# Patient Record
Sex: Male | Born: 1935 | Race: White | Hispanic: No | Smoking: Former smoker
Health system: Southern US, Community
[De-identification: ages and names within clinical notes are randomized; demographics above are authoritative.]

## PROBLEM LIST (undated history)

## (undated) DIAGNOSIS — C61 Malignant neoplasm of prostate: Secondary | ICD-10-CM

## (undated) DIAGNOSIS — I10 Essential (primary) hypertension: Secondary | ICD-10-CM

## (undated) DIAGNOSIS — Z87442 Personal history of urinary calculi: Secondary | ICD-10-CM

## (undated) DIAGNOSIS — E119 Type 2 diabetes mellitus without complications: Secondary | ICD-10-CM

## (undated) DIAGNOSIS — M8580 Other specified disorders of bone density and structure, unspecified site: Secondary | ICD-10-CM

## (undated) DIAGNOSIS — E7800 Pure hypercholesterolemia, unspecified: Secondary | ICD-10-CM

## (undated) DIAGNOSIS — N189 Chronic kidney disease, unspecified: Secondary | ICD-10-CM

## (undated) DIAGNOSIS — I499 Cardiac arrhythmia, unspecified: Secondary | ICD-10-CM

## (undated) DIAGNOSIS — M1990 Unspecified osteoarthritis, unspecified site: Secondary | ICD-10-CM

## (undated) DIAGNOSIS — I480 Paroxysmal atrial fibrillation: Secondary | ICD-10-CM

## (undated) DIAGNOSIS — K219 Gastro-esophageal reflux disease without esophagitis: Secondary | ICD-10-CM

## (undated) HISTORY — PX: LAPAROSCOPIC CHOLECYSTECTOMY: SUR755

## (undated) HISTORY — PX: TONSILLECTOMY: SUR1361

## (undated) HISTORY — PX: CYSTOSCOPY W/ STONE MANIPULATION: SHX1427

---

## 1999-05-05 ENCOUNTER — Ambulatory Visit (HOSPITAL_BASED_OUTPATIENT_CLINIC_OR_DEPARTMENT_OTHER): Admission: RE | Admit: 1999-05-05 | Discharge: 1999-05-05 | Payer: Self-pay | Admitting: Otolaryngology

## 1999-09-21 ENCOUNTER — Encounter: Payer: Self-pay | Admitting: Emergency Medicine

## 1999-09-21 ENCOUNTER — Inpatient Hospital Stay (HOSPITAL_COMMUNITY): Admission: EM | Admit: 1999-09-21 | Discharge: 1999-09-24 | Payer: Self-pay | Admitting: Emergency Medicine

## 2000-01-23 ENCOUNTER — Ambulatory Visit (HOSPITAL_BASED_OUTPATIENT_CLINIC_OR_DEPARTMENT_OTHER): Admission: RE | Admit: 2000-01-23 | Discharge: 2000-01-23 | Payer: Self-pay | Admitting: Urology

## 2002-04-02 ENCOUNTER — Ambulatory Visit (HOSPITAL_COMMUNITY): Admission: RE | Admit: 2002-04-02 | Discharge: 2002-04-02 | Payer: Self-pay | Admitting: Gastroenterology

## 2004-01-01 DIAGNOSIS — C61 Malignant neoplasm of prostate: Secondary | ICD-10-CM

## 2004-01-01 HISTORY — DX: Malignant neoplasm of prostate: C61

## 2004-04-10 ENCOUNTER — Ambulatory Visit: Admission: RE | Admit: 2004-04-10 | Discharge: 2004-06-05 | Payer: Self-pay | Admitting: Radiation Oncology

## 2004-04-30 HISTORY — PX: PROSTATECTOMY: SHX69

## 2004-05-08 ENCOUNTER — Encounter (INDEPENDENT_AMBULATORY_CARE_PROVIDER_SITE_OTHER): Payer: Self-pay | Admitting: *Deleted

## 2004-05-08 ENCOUNTER — Inpatient Hospital Stay (HOSPITAL_COMMUNITY): Admission: RE | Admit: 2004-05-08 | Discharge: 2004-05-12 | Payer: Self-pay | Admitting: Urology

## 2004-06-08 ENCOUNTER — Inpatient Hospital Stay (HOSPITAL_COMMUNITY): Admission: AD | Admit: 2004-06-08 | Discharge: 2004-06-13 | Payer: Self-pay | Admitting: Urology

## 2005-07-31 HISTORY — PX: URETHRAL STRICTURE DILATATION: SHX477

## 2005-08-14 ENCOUNTER — Encounter: Admission: RE | Admit: 2005-08-14 | Discharge: 2005-08-14 | Payer: Self-pay | Admitting: Urology

## 2005-08-17 ENCOUNTER — Ambulatory Visit (HOSPITAL_COMMUNITY): Admission: RE | Admit: 2005-08-17 | Discharge: 2005-08-17 | Payer: Self-pay | Admitting: Urology

## 2005-08-17 ENCOUNTER — Ambulatory Visit (HOSPITAL_BASED_OUTPATIENT_CLINIC_OR_DEPARTMENT_OTHER): Admission: RE | Admit: 2005-08-17 | Discharge: 2005-08-17 | Payer: Self-pay | Admitting: Urology

## 2007-12-10 ENCOUNTER — Encounter: Admission: RE | Admit: 2007-12-10 | Discharge: 2007-12-10 | Payer: Self-pay | Admitting: Urology

## 2007-12-11 ENCOUNTER — Ambulatory Visit (HOSPITAL_BASED_OUTPATIENT_CLINIC_OR_DEPARTMENT_OTHER): Admission: RE | Admit: 2007-12-11 | Discharge: 2007-12-11 | Payer: Self-pay | Admitting: Urology

## 2009-08-09 ENCOUNTER — Ambulatory Visit: Payer: Self-pay | Admitting: Surgery

## 2009-08-09 ENCOUNTER — Encounter (INDEPENDENT_AMBULATORY_CARE_PROVIDER_SITE_OTHER): Payer: Self-pay | Admitting: Orthopedic Surgery

## 2009-08-09 ENCOUNTER — Ambulatory Visit: Admission: RE | Admit: 2009-08-09 | Discharge: 2009-08-09 | Payer: Self-pay | Admitting: Orthopedic Surgery

## 2009-11-23 ENCOUNTER — Observation Stay (HOSPITAL_COMMUNITY): Admission: EM | Admit: 2009-11-23 | Discharge: 2009-11-24 | Payer: Self-pay | Admitting: Emergency Medicine

## 2010-01-28 ENCOUNTER — Encounter: Admission: RE | Admit: 2010-01-28 | Discharge: 2010-01-28 | Payer: Self-pay | Admitting: Orthopedic Surgery

## 2011-04-04 LAB — GLUCOSE, CAPILLARY
Glucose-Capillary: 102 mg/dL — ABNORMAL HIGH (ref 70–99)
Glucose-Capillary: 185 mg/dL — ABNORMAL HIGH (ref 70–99)

## 2011-04-04 LAB — CBC
HCT: 42.8 % (ref 39.0–52.0)
MCV: 88.7 fL (ref 78.0–100.0)
MCV: 89.3 fL (ref 78.0–100.0)
Platelets: 227 10*3/uL (ref 150–400)
RBC: 4.31 MIL/uL (ref 4.22–5.81)
RDW: 14.6 % (ref 11.5–15.5)

## 2011-04-04 LAB — DIFFERENTIAL
Basophils Relative: 0 % (ref 0–1)
Eosinophils Absolute: 0 10*3/uL (ref 0.0–0.7)
Lymphocytes Relative: 7 % — ABNORMAL LOW (ref 12–46)
Lymphs Abs: 0.8 10*3/uL (ref 0.7–4.0)
Monocytes Relative: 2 % — ABNORMAL LOW (ref 3–12)
Neutro Abs: 10.2 10*3/uL — ABNORMAL HIGH (ref 1.7–7.7)

## 2011-04-04 LAB — COMPREHENSIVE METABOLIC PANEL
ALT: 17 U/L (ref 0–53)
AST: 16 U/L (ref 0–37)
Alkaline Phosphatase: 60 U/L (ref 39–117)
Calcium: 8.7 mg/dL (ref 8.4–10.5)
Chloride: 102 mEq/L (ref 96–112)
Creatinine, Ser: 0.91 mg/dL (ref 0.4–1.5)
Potassium: 4 mEq/L (ref 3.5–5.1)
Sodium: 136 mEq/L (ref 135–145)
Total Bilirubin: 0.8 mg/dL (ref 0.3–1.2)

## 2011-04-04 LAB — POCT CARDIAC MARKERS
CKMB, poc: 1.1 ng/mL (ref 1.0–8.0)
Troponin i, poc: <0.05 ng/mL (ref 0.00–0.09)

## 2011-04-04 LAB — URINALYSIS, ROUTINE W REFLEX MICROSCOPIC
Leukocytes, UA: NEGATIVE
Protein, ur: NEGATIVE mg/dL
Specific Gravity, Urine: 1.023 (ref 1.005–1.030)
Urobilinogen, UA: 0.2 mg/dL (ref 0.0–1.0)

## 2011-04-04 LAB — URINE MICROSCOPIC-ADD ON

## 2011-05-15 NOTE — Op Note (Signed)
Carl Strong, Carl Strong              ACCOUNT NO.:  [id number]   MEDICAL RECORD NO.:  [id number]          PATIENT TYPE:  AMB   LOCATION:  [facility]                         FACILITY:  [facility]   PHYSICIAN:  Sigmund I. Patsi Sears, M.D.DATE OF BIRTH:  March 03, 1929   DATE OF PROCEDURE:  12/11/2007  DATE OF DISCHARGE:                               OPERATIVE REPORT   PREOPERATIVE DIAGNOSIS:  Bladder neck contracture and bladder stone.   POSTOPERATIVE DIAGNOSIS:  Bladder neck contracture and bladder stone.   PROCEDURES:  1. Pancystourethroscopy.  2. Dilation of bladder neck contractures/stricture.  3. Laser lithotripsy of bladder stone (4 cm).   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  He also had a bladder stone and presents today for  elective management.   PROCEDURE IN DETAIL:  The patient is brought to the operating room.  After the successful induction of LMA anesthetic, performing a  preoperative time out, and receiving preoperative antibiotics, he was  placed in the dorsal lithotomy position noting that all pressure points  were padded.  He was prepped and draped in the usual sterile fashion.  We used a 22 French sheath to navigate through the patient's urethra and  at the bladder neck, a bladder neck contracture was seen.  Of note, his  urethra otherwise appeared normal except for the absence of a prostate.  The bladder neck contracture was traversed with a glidewire under direct  vision.  We then used the balloon dilator over the glidewire to dilate  the stricture to approximately 14 to 16 atmospheres of pressure.  The  balloon was left in place for approximately one minute.  At this point,  the balloon was removed and the cystoscope was inserted back into the  urethra and now the bladder neck contracture had been dilated sufficient  to allow the passage of a 22 French cystoscope.   On entering the  patient's bladder, a large stone could be seen.  It was approximately 4  cm in diameter.  The rest of the bladder appeared normal, although the  exam was limited at this point due to the stone.   At this point, we elected to proceed with laser lithotripsy.  Using the  1 mm laser fiber on settings of 1 joule and 6 hertz, we broke up the  stone into small fragments.  As we proceeded at breaking up the stone  into smaller fragments, we were able to identify the nidus for the stone  formation which was a Hem-A-Lock clip that had somehow migrated and  eroded into the bladder.  All the stone fragments and the Hem-A-Lock  clip were removed in their entirety and sent for an evaluation.  At this  point, we completed the cystoscopy.  We identified both ureteral  orifices effluxing clear urine and the mucosa was identified and all  looked normal except for slightly friable from the stone manipulation.  The bladder was emptied, filled up again, and as there was some minor  mucosal bleeding, we did leave a catheter  in place which was a 20 [place]  catheter.  At this point, after seeing clear urine emanating from the  catheter and the balloon inflated, the procedure was ended.  Please  note, Dr. Patsi Sears was present throughout the entirety of the case.  Estimated blood loss minimal.  Urine output not recorded.  Drains 20  French Foley catheter.   SPECIMENS:  1. Hem-A-Lock clip.  2. Stone for stone analysis.     ______________________________  Dr. Erin Hearing I. Patsi Sears, M.D.  Electronically Signed    Hadley Pen  D:  12/11/2007  T:  12/11/2007  Job:  [id number]

## 2011-05-18 NOTE — H&P (Signed)
NAME:  Carl Strong, Carl Strong                        ACCOUNT NO.:  [id number]   MEDICAL RECORD NO.:  [id number]                   PATIENT TYPE:  INP   LOCATION:  0351                                 FACILITY:  [facility]   PHYSICIAN:  Lindaann Slough, M.D.               DATE OF BIRTH:  July 20, 1929   DATE OF ADMISSION:  06/08/2004  DATE OF DISCHARGE:                                HISTORY & PHYSICAL   HISTORY OF PRESENT ILLNESS:  Mr. Whitenack is a 77 year old single white male  (widowed) who is a prostate cancer patient of Dr. Imelda Pillow.  On May 08, 2004 the patient underwent radical retropubic prostatectomy with Dr.  Patsi Sears.  He had a relatively normal postoperative course with the  exception of some minor suprapubic swelling.  He also had some penile and  scrotal edema.  This resolved shortly after discharge and the patient had  done well ever since.  On Wednesday, June 07, 2004, the patient presented to  the office for a regular follow up.  He had some significant swelling in his  suprapubic area that he states started on Monday, June 05, 2004.  The patient  states that up until that time his scrotal and penile edema had gone down  and he was doing well.  When he presented to the office on Wednesday, June 07, 2004, he stated that he was not feeling well and had not eaten in a  couple of days.  He had significant on and off pain with this swelling.  He  states that he had not really been taking his temperature but he thought he  had been running a low-grade temp.  In the office it was 99.  He states that  he tried some ice on the area; however, this did not help much.  He took  some Tylenol and Advil for the pain but this also did not alleviate his  symptoms.  He is concerned because he feels like things are not getting much  better.   The patient was given 1 g of Rocephin in the office on Wednesday, June 07, 2004, and started on Cipro 500 mg p.o. b.i.d.  He did not want to be  admitted to  the hospital if at all possible, so Dr. Patsi Sears allowed him  to go home on Wednesday; however, he presents back to the office today on  June 08, 2004 with worsening swelling in the suprapubic area.  He was  evaluated by Dr. Brunilda Payor and myself and we have advised the patient that he  needs to be admitted to the hospital.  He will be admitted for abscess of  his suprapubic area around his RRP wound.  CT scan of the pelvis in the  office was obtained and radiologist has called Dr. Brunilda Payor to confirm that he  does have fluid in his pelvis consistent with an abscess.   PAST MEDICAL HISTORY:  1. Diabetes mellitus type 2--well controlled.  2. Hypertension.  3. History of BPH.  4. History of RRP on May 08, 2004.   SURGICAL HISTORY:  RRP on May 08, 2004.   SOCIAL HISTORY:  The patient is widowed and has one daughter who lives here  in town.  He lives alone.  He smoked 2 packs a day for 40 years but quit in  1985.  He denies any alcohol use.   FAMILY HISTORY:  The patient states details is unknown and it is  noncontributory.   MEDICATIONS:  1. Metformin 500 mg p.o. b.i.d.  2. Glipizide ER 10 mg p.o. daily.  3. Hydrochlorothiazide 25 mg p.o. daily.  4. Lisinopril 10 mg p.o. daily.  5. Cipro 500 mg p.o. b.i.d. (given to him by Dr. Patsi Sears yesterday).   ALLERGIES:  No known drug allergies.   PHYSICAL EXAMINATION:  GENERAL:  The patient is a 77 year old obese white  male who presents to the office today in mild distress.  HEENT:  Normocephalic, PERRLA, throat is clear.  NECK:  Supple with no lymphadenopathy noted.  CARDIAC:  Regular rate and rhythm.  No murmurs, rubs, or gallops are noted.  RESPIRATORY:  Lungs are clear to auscultation bilaterally.  ABDOMEN:  Rotund abdomen, some swelling at the bottom of his abdomen, but he  has positive bowel sounds.  GENITOURINARY:  He has significant swelling in his suprapubic area that is  tender to touch and warm as well as erythematous.  His RRP scar  is well  healed but also is red and tender to touch.  His penis has some mild to  moderate edema as well as his scrotum.  NEUROLOGICAL:  He is alert and oriented x3.  EXTREMITIES:  No edema is noted at this time on his lower extremities.   IMAGING:  A CT scan of the pelvis was obtained in our office today before  being admitted to the hospital and the radiologist has called Dr. Brunilda Payor to  inform him that his findings are significant with abscess in the suprapubic  area.   ASSESSMENT:  Abscess around radical retropubic prostatectomy surgical  incision and in the lower pelvic area.   PLAN:  Dr. Brunilda Payor and I have admitted this patient to [facility].  We  will start Rocephin 1 g IV q.12 h. as well as Cipro 500 mg p.o. b.i.d.  He  will also have medicines for pain control as well.  We will observe the  patient and possibly consider draining the area of abscess.  He will be  admitted for further evaluation.     Terri Piedra, N.P.                         Lindaann Slough, M.D.    HB/MEDQ  D:  06/08/2004  T:  06/08/2004  Job:  [id number]

## 2011-05-18 NOTE — Op Note (Signed)
NAME:  Carl Strong, Carl Strong                        ACCOUNT NO.:  [id number]   MEDICAL RECORD NO.:  [id number]                   PATIENT TYPE:  INP   LOCATION:  X008                                 FACILITY:  [facility]   PHYSICIAN:  Sigmund I. Patsi Sears, M.D.         DATE OF BIRTH:  Jan 29, 1929   DATE OF PROCEDURE:  05/08/2004  DATE OF DISCHARGE:                                 OPERATIVE REPORT   PREOPERATIVE DIAGNOSIS:  Prostate cancer (Gleason 6, 7; pre prostatic  specific antigen 4.81).   POSTOPERATIVE DIAGNOSIS:  Prostate cancer (Gleason 6, 7; pre prostatic  specific antigen 4.81).   OPERATION/PROCEDURE:  Radical retropubic prostatectomy with bilateral pelvic  lymph node dissection.   SURGEON:  Sigmund I. Patsi Sears, M.D.   ASSISTANT:  Rhae Lerner, M.D.   ANESTHESIA:  General endotracheal anesthesia.   COMPLICATIONS:  None.   INDICATIONS FOR PROCEDURE:  Carl Strong is a 77 year old man with a history  of severe lower urinary tract symptoms and bladder outlet obstruction  secondary to BPH who is status post TUNA in 2001 and has since been treated  on alpha blockers.  The patient recently underwent PSA screening and was  found to have a PSA of 4.81.  The patient subsequent underwent transrectal  ultrasound-guided biopsy of his prostate which demonstrated a 41 mL prostate  with four areas of cancer:  1) Gleason's 6 in right lateral lobe, 2)  Gleason's 6 in the left lateral lobe, 3) Gleason's 7 in the left medial  lobe, 4) Gleason's 7 in the apex.  Carl Strong subsequently underwent CT  scan of the abdomen and pelvis which was negative for metastatic disease and  a bone scan which was also negative for metastatic disease.  Carl Strong  presents for radical retropubic prostatectomy.   DESCRIPTION OF PROCEDURE:  The patient was brought to the operating room and  following induction of anesthesia, was placed in the supine position with a  slight flex in the table and  approximately 20 degrees of Trendelenburg and  then prepped and draped in the usual sterile fashion.   A low abdominal incision was subsequently performed from the umbilicus to  the pubic bone.  The dissection was carried down through the subcutaneous  tissues and through the anterior rectus fascia using Bovie cautery.  The  rectus bellies were subsequently divided and the transversalis fascia  identified and entered to reveal the space of Retzius.  The transversalis  fascia was subsequently divided all the way down to the pubic bone and all  the way up to the umbilicus being careful to avoid injury into the  peritoneal cavity.  The peritoneum was subsequently dissected off of the  iliac vessels bilaterally and Omni track apparatus placed.   Attention was first focused on the right pelvic sidewall and the retractors  set to perform a right pelvic lymph node dissection following placement of  the 20-French Foley catheter into the bladder.  The right pelvic lymph nodes  were subsequently removed and sent for pathology evaluation.  During the  pelvic lymph node dissection, hemostasis was maintained using multiple small  clips on numerous tiny vessels involved with the right lymph node packet.  Once the right pelvic lymphadenectomy had been performed, the retractor  blades were reset to reveal the left pelvis.  A left pelvic lymphadenectomy  was subsequently performed and the lymph nodes sent for pathology.  Again,  hemostasis was maintained using multiple hemoclips to prevent small vessel  bleeding.   Once the bilateral pelvic lymph node dissection was completed, all fatty  tissues overlying the prostate as well as the bladder neck were bluntly  dissected away using a Kittner as well as Yonker suction.  Once the  surrounding tissues had been dissected away from the anterior surface of the  prostate and the overlying endopelvic fascia, the endopelvic fascia was  entered bilaterally using  sharp dissection with a 15-blade.  Pelvic fascia  was entered laterally near the reflection of the endopelvic fascia on the  pelvic sidewall.  Blunt dissection with the index finger was subsequently  used on either side to dissect all tissues away from the lateral edges of  the prostate.  At this time, the bilateral puboprostatic ligaments were  identified and these were dissected safely away from the dorsal vein complex  and divided.  Attention was now focused on the dorsal vein complex.  Initially a McDougal clamp was passed between the dorsal vein complex and  the underlying urethra at the apex of the prostate.  A #1 Vicryl suture was  passed through this space by the McDougal clamp and the dorsal vein complex  ligated.  A second #1 Vicryl suture was placed just distal to the first,  again using a McDougal clamp and a Stamey retractor on the prostate.  A 0  Vicryl was finally used to suture ligate the dorsal vein complex distal to  the first two sutures and after tying this, the dorsal vein complex was  divided with a 15-blade.  Two to three small areas of bleeding were noted  after division of the dorsal vein complex and these were controlled using #1  Monocryl to perform through separate figure-of-eight ligations.  Once  hemostasis had been achieved, the anterior and lateral surfaces of the  urethra were divided using a 15-blade and after apprehending fully with a  Kocher clamp, catheter was cut at the urethral meatus and brought into the  wound to provide traction on the prostate.  At this point the remainder of  the urethra posterior to the Foley catheter was divided.  At this point two  to three more figure-of-eight sutures using 2-0 Monocryl were required to  achieve hemostasis.  Once this had been performed, the posterior surface of  the prostate was slowly peeled away from the anterior rectum by developing space between the posterior leaf of Denonvilliers fascia and anterior  rectal  fascia.  The prostate was dissected off of the rectum all the way to the  seminal vesicles and vas deferens.  At this point the prostate was replaced  in its natural position and attention focused on the anterior bladder neck.  The bladder neck was subsequently opened using a 15-blade and carefully  divided down to the Foley balloon using Metzenbaum scissors as well as blunt  dissection with a right-angle clamp.  Once this had been performed, the  balloon was deflated and removed from the bladder, and the  proximal end of  the Foley catheter to the distal end using a Kocher clamp in order to  provide traction on the prostate.  The posterior bladder neck was  subsequently divided and the seminal vesicles cut along with a few remaining  bands of tissue holding the prostate to the anterior surface of the rectum.  Specimen was subsequently delivered and sent to pathology.   A careful check was once again performed to assure hemostasis and then  attention was focused on the urethral stump.  Five full-thickness 2-0  Monocryl sutures were placed at the 2 o'clock, 4 o'clock, 8 o'clock, 10  o'clock, and 12 o'clock positions around the urethra.   Next, attention was focused on the bladder neck.  The bladder neck was  narrowed down to a size compatible with the urethral stump using a tennis  racquet procedure.  The bladder mucosa at the bladder neck was subsequently  everted using circumferential, interrupted 4-0 Vicryl sutures.  A 20-French  Foley catheter was subsequently passed through the urethra and used to  bridge the gap between the urethral stump and the bladder.  The balloon was  then inflated within the bladder.  The anastomosis was then completed by  placing each of the previously described sutures in the urethral stump  through the newly created bladder neck and the lumen the outside fascia, and  tying all of these sutures after removal of the retractor blades under  gentle  tension from the Foley catheter.  Once the anastomosis was completed,  approximately 150 mL of saline were instilled through the catheter to  confirm the integrity of the urethral anastomosis.  A JP drain was  subsequently placed in the left lower quadrant through a separate incision  and sutured into place using #1 nylon suture.  The wound was subsequently  closed using a running #1 PDS to close the rectus muscles inferiorly and the  posterior rectus fascia superiorly followed by a second running #1 PDS to  close the anterior rectus fascia.  The skin was subsequently closed using  skin staples.  The wound was subsequently cleaned and dressed and the  patient allowed to awaken.  The patient tolerated the procedure well.  There  were no complications.   Please note that Dr. Jethro Bolus was present for the entire case and  participated in all aspects of the procedure.    Rhae Lerner, M.D.               Sigmund I. Patsi Sears, M.D.    JP/MEDQ  D:  05/08/2004  T:  05/08/2004  Job:  [id number]

## 2011-05-18 NOTE — Op Note (Signed)
[place]. [facility]  Patient:    Carl Strong, Carl Strong                       MRN: [id number] Attending:  Vonzell Schlatter. Patsi Sears, M.D. CC:         Merlene Laughter, M.D., Medicine                           Operative Report  PREOPERATIVE DIAGNOSIS: Benign prostatic hypertrophy.  POSTOPERATIVE DIAGNOSIS:  Same.  OPERATION:  Targus microwave thermotherapy.  SURGEONS:  Tannenbaum  ANESTHESIA:  Local.  PROCEDURE:  With the patient in the supine position, the bladder was catheterized, drained of clear urine.  The Xylocaine jelly was then placed in the urethra, and Xylocaine liquid was placed in the bladder.  The microcatheter was placed and the rectal ultrasound was accomplished and showed the balloon to be in excellent position.  The transrectal temperature probe was placed, the patient then underwent targus microwave thermotherapy.  HISTORY:  This is a 77 year old diabetic, who has been followed since 1998 with  benign prostatic hypertrophy, with a symptom score sheet of 20/7, with nocturia  frequency.  His PSA was 1.5, and he was felt to have a prostate nodule at that time.  Currently, the patient has benign prostatic hypertrophy only, on Cardura 8 mg one h.s.   He did worse on Flomax and was restarted on his Cardura.  He is now here for a targus microwave thermotherapy.  The patient has a prostate volume of 46cc, with a prostatic length of 3cc.  He has a PSA of 2.7, the PSA ______ 30%.  PROCEDURE:  The patient tolerated the microwave procedure well.  He had a maximum therapy time of 28 minutes and 30 seconds, with maximum rectal temperature of 42C, and a maximum MDS of 37.6C, with a total energy of 52.3 kilojoule.  Foley catheter was placed.  The patient desires to return to the office for Foley catheter removal and he will return for voiding trial Monday. DD:  01/23/00 TD:  01/23/00 Job: [id number]

## 2011-05-18 NOTE — Procedures (Signed)
[place]. [facility]  Patient:    Carl Strong, Carl Strong Visit Number: [id number] MRN: [id number]          Service Type: END Location: ENDO Attending Physician:  Dennison Bulla Ii Dictated by:   Verlin Grills, M.D. Proc. Date: 04/02/02 Admit Date:  04/02/2002 Discharge Date: 04/02/2002   CC:         Hal T. Stoneking, M.D.   Procedure Report  DATE OF BIRTH:  November 26, 1929.  REFERRING PHYSICIAN:  PROCEDURE PERFORMED:  Colonoscopy.  ENDOSCOPIST:  Verlin Grills, M.D.  INDICATIONS FOR PROCEDURE:  The patient is a 77 year old male.  He is referred for his first screening colonoscopy with polypectomy to prevent colon cancer.  I discussed with the patient the complications associated with colonoscopy and polypectomy including a 15 per 1000 risk of bleeding and 4 per 1000 risk of colon perforation requiring surgical repair.  The patient has signed the operative permit.  PREMEDICATION:  Versed 5 mg, Demerol 50 mg.  ENDOSCOPE:  Olympus pediatric video colonoscope.  DESCRIPTION OF PROCEDURE:  After obtaining informed consent, the patient was placed in the left lateral decubitus position.  I administered intravenous Demerol and intravenous Versed to achieve conscious sedation for the procedure.  The patients blood pressure, oxygen saturation and cardiac rhythm were monitored throughout the procedure and documented in the medical record.  Anal inspection was normal.  Digital rectal exam was normal.  The Olympus pediatric video colonoscope was then introduced into the rectum and under direct vision, advanced to the cecum.  Colonic preparation for the exam today was satisfactory.  Rectum:  Normal.  Sigmoid colon and descending colon:  Normal.  Splenic flexure:  Normal.  Transverse colon:  Normal.  Hepatic flexure:  Normal.  Ascending colon:  Normal.  Cecum and ileocecal valve:  Normal.  ASSESSMENT:  Normal proctocolonoscopy  to the cecum.  No endoscopic for the presence of colorectal neoplasia. Dictated by:   Verlin Grills, M.D. Attending Physician:  Dennison Bulla Ii DD:  04/02/02 TD:  04/02/02 Job: [id number]

## 2011-05-18 NOTE — Op Note (Signed)
[name], Carl Strong              ACCOUNT NO.:  [id number]   MEDICAL RECORD NO.:  [id number]          PATIENT TYPE:  AMB   LOCATION:  NESC                         [facility]ACILITY[facility]  [facility]   PHYSICIAN:  Sigmund I. Patsi Sears, M.D.DATE OF BIRTH:  [birth year]   DATE OF PROCEDURE:  08/17/2005  DATE OF DISCHARGE:                                 OPERATIVE REPORT   PREOPERATIVE DIAGNOSES:  Urethral stricture disease.   POSTOPERATIVE DIAGNOSES:  Urethral stricture disease.   OPERATIONS:  Cystourethroscopy, Heyman urethral dilation, balloon dilation  of urethral stricture, Foley catheter insertion (18-French).   SURGEON:  Sigmund I. Patsi Sears, M.D.   ANESTHESIA:  General LMA.   PREPARATION:  After appropriate preanesthesia, the patient was brought to  the operating room, placed on the operating table in dorsal supine position  where general LMA anesthesia was introduced. He was then replaced in dorsal  lithotomy position with the pubis was prepped with Betadine solution and  draped in the usual fashion.   REVIEW OF HISTORY:  This 77 year old male, has a history of radical  retropubic prostatectomy 1-1/2 years ago, with postop bladder neck  contracture. He is also a diabetic. The patient has required urethral  stricture dilation in the past, and is currently for dilation under  anesthesia.   DESCRIPTION OF PROCEDURE:  Cystourethroscopy was accomplished and dense  proximal urethral stricture is identified. The guidewire was passed across  the stricture into the bladder, and systematic Heyman dilation is  accomplished to a size 22-French. Balloon dilation with the 10 cm balloon  dilator was then accomplished for 3 minutes and following this, cystoscopy  was accomplished into the bladder. The bladder appeared to have some clots  within it, these were irrigated free from the bladder. Because of the dense  urethral stricture dilation, it was elected to leave a Foley catheter, an 38-  [place] Foley  catheter was placed with 10 mL in the balloon. The patient had  B and O suppository. He was given IV Toradol, awakened and taken to recovery  room in good condition.      Sigmund I. Patsi Sears, M.D.  Electronically Signed     SIT/MEDQ  D:  08/17/2005  T:  08/17/2005  Job:  [id number]

## 2011-05-18 NOTE — H&P (Signed)
NAME:  NAMON, VILLARIN                        ACCOUNT NO.:  [id number]   MEDICAL RECORD NO.:  [id number]                   PATIENT TYPE:  INP   LOCATION:  0359                                 FACILITY:  [facility]   PHYSICIAN:  Sigmund I. Patsi Sears, M.D.         DATE OF BIRTH:  26-Feb-1929   DATE OF ADMISSION:  05/08/2004  DATE OF DISCHARGE:  05/12/2004                                HISTORY & PHYSICAL   HISTORY OF PRESENT ILLNESS:  Mr. Gust is a 77 year old single white male  (widowed) who was seen in our office for consultation for prostate cancer.  The patient had TUNA therapy in 2001 for a symptom score sheet of 25/7, but  not improve very much until placed on doxazosin postop with a subsequent  decrease in symptom score sheet to 10/7.  Following a kidney infection  recently, the patient was reevaluated and was found to have an International  Prostate Score Sheet of 9/7 while on doxazosin.  He failed a trial of  UroXatral and returned to doxazosin.  He was found to have a PSA of 4.81  with a PSA II of 13%.   Prostate biopsies were then accomplished and showed that the patient had 4  separate areas of cancer.  The patient had a Gleason 6 prostate cancer in  less than 5% of the biopsies from the right lateral lobe, a 20% Gleason 6 in  the left lateral lobe, 30% Gleason 7 adenocarcinoma in the left median lobe,  and 30% Gleason 7 adenocarcinoma in the prostate in the apex biopsies.   Cancer staging workup showed that the patient had a CT of the abdomen which  was negative, CT of the pelvis showing a moderately enlarged prostate but no  pelvic lymphadenectomy.  Ureters were normal.  DEXA scan shows a normal AP  spine, a normal femoral neck, a normal hip.  Several therapies were  discussed with the patient regarding his prostate cancer.  He had a  consultation with Dr. Wynn Banker in regards to radiation therapy.  Dr. Dan Humphreys had discussed this with the patient and had agreed  that the  patient should probably seek radical prostatectomy.  The patient returned to  the office and re-discussed his options with Dr. Lynelle Smoke I. Tannenbaum and  they both decided that radical retropubic prostatectomy was the best  treatment plan for him.   PAST MEDICAL HISTORY:  1. Diabetes mellitus, type 2 -- well-controlled.  2. Hypertension.  3. History of BPH.   SURGICAL HISTORY:  None.   SOCIAL HISTORY:  The patient is widowed.  He has 1 daughter who lives here  in town.  He smoked 2 packs a day for 40 years but quit in 1985.  He denies  any alcohol use.   MEDICATIONS:  1. Doxazosin 8 mg 1 p.o. nightly.  2. Metformin 500 mg p.o. daily.  3. Glipizide ER 10 mg p.o. daily.  4. Hydrochlorothiazide 25 mg  p.o. daily.  5. Lisinopril 10 mg p.o. daily.   ALLERGIES:  No known drug allergies.   PHYSICAL EXAM:  GENERAL:  The patient is a 77 year old obese white male in  no acute distress.  HEENT/NECK:  Normocephalic; neck supple; PERRLA; no lymphadenopathy noted.  CARDIAC:  Regular rate and rhythm, no murmurs, rubs, or gallops noted.  RESPIRATORY:  Respiratory clear to auscultation bilaterally.  GI:  Rotund abdomen, soft, nontender, positive bowel sounds.  GU:  Prostate size is 41.3 mL by volume.  NEUROLOGIC:  Neuro is grossly intact.  EXTREMITIES:  No edema is noted.   LABORATORIES, X-RAYS AND IMAGING:  CT scan negative for lymphadenopathy.   Prostate biopsies show 4 separate areas of cancer (see breakdown in HPI).   ASSESSMENT:  Prostate cancer.   PLAN:  Dr. Patsi Sears has reviewed treatment options with the patient and  radical retropubic prostatectomy was decided on and risks and benefits of  procedure explained to the patient.  Questions answered and the patient will  be admitted to [facility] for surgery.     Terri Piedra, N.P.                         Sigmund I. Patsi Sears, M.D.    HB/MEDQ  D:  05/15/2004  T:  05/15/2004  Job:  [id number]

## 2011-05-18 NOTE — Discharge Summary (Signed)
Carl Strong, Carl Strong                        ACCOUNT NO.:  [id number]   MEDICAL RECORD NO.:  [id number]                   PATIENT TYPE:  INP   LOCATION:  0351                                 FACILITY:  [facility]   PHYSICIAN:  Lindaann Slough, M.D.               DATE OF BIRTH:  11-30-1929   DATE OF ADMISSION:  06/08/2004  DATE OF DISCHARGE:  06/13/2004                                 DISCHARGE SUMMARY   DISCHARGE DIAGNOSES:  Pelvic abscess status post radical prostatectomy for  carcinoma of the prostate.   PROCEDURE:  Incision and drainage of pelvic abscess on June 09, 2004.   HISTORY:  The patient is a 77 year old male who had a radical prostatectomy  on May 08, 2004.  He had a normal postoperative course, with the exception of  some minor suprapubic swelling.  He was seen in the office on June 07, 2004,  and was found to have more swelling in the suprapubic area, and he states  that that started 2 days prior to his visit on June 07, 2004.  On June 08, 2004, he returned to the office, and a CT scan of the pelvis showed a pelvic  abscess that was outside of the peritoneal cavity.  The patient was then  admitted for further treatment.   PHYSICAL EXAMINATION:  ABDOMEN:  Soft, tender in the suprapubic area where  there was some swelling.  Bowel sounds were normal.  His bladder is not  distended.   LABORATORY DATA:  His hemoglobin on admission was 11.4, with hematocrit of  33.6, WBC 10.5.  BUN 18, creatinine 1.0, sodium 130, potassium 2.9.   The lower end of the incision was opened under local anesthesia at the  patient's bedside, but no drainage was obtained through that incision.  The  patient was then kept NPO, and the plan was to take him to the OR on June 09, 2004 for drainage under anesthesia.  However, on the morning of 06/09/04,  it started draining from fluid through the incision, and a Penrose drain was  placed through the wound, and a large amount of fluid was drained out of  the  incision.  Therefore, he was not taken back to the OR.  Culture of the wound  was obtained, and the culture was positive for Staphylococcus aureus that  was sensitive to cefazolin.  The patient was kept on IV antibiotics, and  also oral Cipro, and the swelling of the lower abdomen had markedly  decreased, and the drainage had progressively decreased, and on June 13, 2004 he was afebrile, he was eating well, he was feeling well, he did not  have any pain, he was voiding well, except for some mild stress  incontinence.  He was then discharged home.   DISCHARGE MEDICATIONS:  1. Keflex 500 mg q.6h.  2. Metformin 500 mg twice a day.  3. Glucotrol 10 mg daily.  4. HCTZ 25 mg daily.  5. Lisinopril 10 mg daily.  6. Norvasc 5 mg daily.  7. Multivitamins one tablet daily.   FOLLOW UP:  He will be followed by Dr. Patsi Sears.   CONDITION ON DISCHARGE:  Improved.   DISCHARGE DIET:  Regular.   DISCHARGE INSTRUCTIONS:  1. The patient is instructed not to do any lifting, straining, but he may     drive.  2. He needs to change his dressing p.r.n.                                               Lindaann Slough, M.D.    MN/MEDQ  D:  06/13/2004  T:  06/13/2004  Job:  [id number]

## 2011-05-18 NOTE — Discharge Summary (Signed)
NAME:  Carl Strong, Carl Strong                        ACCOUNT NO.:  [id number]   MEDICAL RECORD NO.:  [id number]                   PATIENT TYPE:  INP   LOCATION:  0359                                 FACILITY:  [facility]   PHYSICIAN:  Sigmund I. Patsi Sears, M.D.         DATE OF BIRTH:  1929/05/15   DATE OF ADMISSION:  05/08/2004  DATE OF DISCHARGE:  05/12/2004                                 DISCHARGE SUMMARY   DISCHARGE DIAGNOSIS:  Prostate cancer.   HISTORY OF PRESENT ILLNESS:  Carl Strong is a 77 year old single white male  (widowed) who was seen in our office for consultation for prostate cancer.  The patient was found to have a PSA of 4.81 with a PSA2 of 13%.  Prostate  biopsies were accomplished and showed that the patient had 4 separate areas  of cancer.  The patient had a Gleason 6 prostate cancer in less than 5% of  the biopsies in the right lateral lobe, a 20% Gleason 6 in the left lateral  lobe, a 30% Gleason 7 adenocarcinoma in the left median lobe, and 30%  Gleason 7 adenocarcinoma in the prostate in the apex biopsies.  Dr.  Patsi Sears discussed options with the patient for treatment.  The patient  was consulted by Dr. Kathrin Greathouse for radiation therapy and after that  consultation it was decided that the patient would have radical retropubic  prostatectomy as the best treatment plan for him.   PAST MEDICAL HISTORY:  1. Diabetes mellitus type 2, well controlled.  2. Hypertension.  3. History of BPH.   HOSPITAL COURSE:  The patient underwent radical retropubic prostatectomy  with Dr. Patsi Sears.  His postoperative course was essentially uneventful.  The patient did have a significant drop from 10.4 to 8.5 in his hemoglobin.  At that time, he was given 2 units of packed red blood cells and the  patient's hemoglobin stabilized again.  The patient's only other complaint  was that his scrotum and penis began to swell.  This was lymph fluid due  from the surgery.  JP drain was  discontinued on the day before discharge and  the patient was ambulating well, having good bowel movements and able to  urinate without any problems.  On postop day #4, the patient was discharged  to home with instructions on how to alleviate his scrotal swelling.  He will  return to the office to see [facility] to remove his staples.   CONDITION ON DISCHARGE:  Stable.   DISCHARGE MEDICATIONS:  1. Vicodin p.r.n. for pain.  2. Cipro to take until his catheter is removed.   PLAN:  The patient will follow up in the office with [facility] to  have his staples removed at postop day 7 to 8, and then after having two  weeks of having his Foley catheter in he will also return to the office to  have a voiding trial and have it removed with  further follow up by Dr.  Patsi Sears.     Terri Piedra, N.P.                         Sigmund I. Patsi Sears, M.D.   HB/MEDQ  D:  05/25/2004  T:  05/25/2004  Job:  [id number]

## 2011-10-08 LAB — BASIC METABOLIC PANEL
Calcium: 9.4
Creatinine, Ser: 0.87
GFR calc Af Amer: >60
Sodium: 143

## 2011-10-08 LAB — POCT HEMOGLOBIN-HEMACUE: Hemoglobin: 14.1

## 2014-01-25 ENCOUNTER — Ambulatory Visit: Payer: Medicare Other | Attending: Geriatric Medicine | Admitting: Rehabilitative and Restorative Service Providers"

## 2014-01-25 DIAGNOSIS — R42 Dizziness and giddiness: Secondary | ICD-10-CM | POA: Insufficient documentation

## 2014-01-25 DIAGNOSIS — IMO0001 Reserved for inherently not codable concepts without codable children: Secondary | ICD-10-CM | POA: Insufficient documentation

## 2014-01-27 ENCOUNTER — Encounter: Payer: Medicare Other | Admitting: Rehabilitative and Restorative Service Providers"

## 2014-02-03 ENCOUNTER — Ambulatory Visit: Payer: Medicare Other | Admitting: Rehabilitative and Restorative Service Providers"

## 2014-10-04 ENCOUNTER — Ambulatory Visit: Payer: Medicare Other | Attending: Geriatric Medicine | Admitting: Rehabilitative and Restorative Service Providers"

## 2014-10-04 DIAGNOSIS — E119 Type 2 diabetes mellitus without complications: Secondary | ICD-10-CM | POA: Diagnosis not present

## 2014-10-04 DIAGNOSIS — Z5189 Encounter for other specified aftercare: Secondary | ICD-10-CM | POA: Insufficient documentation

## 2014-10-04 DIAGNOSIS — C61 Malignant neoplasm of prostate: Secondary | ICD-10-CM | POA: Insufficient documentation

## 2014-10-04 DIAGNOSIS — I10 Essential (primary) hypertension: Secondary | ICD-10-CM | POA: Insufficient documentation

## 2014-10-04 DIAGNOSIS — R269 Unspecified abnormalities of gait and mobility: Secondary | ICD-10-CM | POA: Diagnosis not present

## 2014-10-04 DIAGNOSIS — H8110 Benign paroxysmal vertigo, unspecified ear: Secondary | ICD-10-CM | POA: Insufficient documentation

## 2014-10-11 ENCOUNTER — Ambulatory Visit: Payer: Medicare Other | Admitting: Rehabilitative and Restorative Service Providers"

## 2014-10-11 DIAGNOSIS — Z5189 Encounter for other specified aftercare: Secondary | ICD-10-CM | POA: Diagnosis not present

## 2014-10-22 ENCOUNTER — Ambulatory Visit: Payer: Medicare Other | Admitting: Rehabilitative and Restorative Service Providers"

## 2014-10-22 DIAGNOSIS — Z5189 Encounter for other specified aftercare: Secondary | ICD-10-CM | POA: Diagnosis not present

## 2016-01-05 ENCOUNTER — Ambulatory Visit
Admission: RE | Admit: 2016-01-05 | Discharge: 2016-01-05 | Disposition: A | Discharge status: Home / Self Care | Payer: Medicare Other | Source: Ambulatory Visit | Attending: Geriatric Medicine | Admitting: Geriatric Medicine

## 2016-01-05 ENCOUNTER — Other Ambulatory Visit: Payer: Self-pay | Admitting: Geriatric Medicine

## 2016-01-05 DIAGNOSIS — R059 Cough, unspecified: Secondary | ICD-10-CM

## 2016-01-05 DIAGNOSIS — R05 Cough: Secondary | ICD-10-CM

## 2016-02-27 ENCOUNTER — Ambulatory Visit: Payer: Medicare Other | Attending: Geriatric Medicine | Admitting: Rehabilitative and Restorative Service Providers"

## 2016-02-27 DIAGNOSIS — R42 Dizziness and giddiness: Secondary | ICD-10-CM | POA: Diagnosis not present

## 2016-02-27 DIAGNOSIS — R269 Unspecified abnormalities of gait and mobility: Secondary | ICD-10-CM | POA: Insufficient documentation

## 2016-02-27 DIAGNOSIS — R2681 Unsteadiness on feet: Secondary | ICD-10-CM | POA: Diagnosis not present

## 2016-02-27 NOTE — Therapy (Signed)
[facility] [address] [place], [place], [address] Phone: [phone]   Fax:  [phone]  Physical Therapy Evaluation  Patient Details  Name: Carl Strong MRN: [id number] Date of Birth: January 20, 1929 Referring Provider: Lajean Manes  Encounter Date: 02/27/2016      PT End of Session - 02/27/16 0907    Visit Number 1   Number of Visits 5   Date for PT Re-Evaluation 03/28/16   Authorization Type [employer] Medicare   Authorization Time Period G codes every 10th visit   PT Start Time 0804   PT Stop Time 0846   PT Time Calculation (min) 42 min   Activity Tolerance Patient tolerated treatment well   Behavior During Therapy [facility] for tasks assessed/performed      No past medical history on file.  No past surgical history on file.  There were no vitals filed for this visit.  Visit Diagnosis:  Dizziness and giddiness  Unsteadiness  Abnormality of gait      Subjective Assessment - 02/27/16 0808    Subjective The pt is an 82 y.o. M with PMH of prostate cancer, HTN, DM II, with complaints of dizziness. Says he woke up Friday morning and couldn't get "from the bed to the living room without holding on." Says the whole room was spinning.  Pt says it has gotten better since last Friday but still feels "a bit woozy."   Pertinent History Prostate CA, HTN, DM II   Patient Stated Goals "I'd like to get rid of this dizziness and get back to feeling comfortable going out."   Currently in Pain? No/denies           [facility] PT Assessment - 02/27/16 [id number]    Assessment   Medical Diagnosis Vertigo/dizziness   Referring Provider Stoneking, Hal   Onset Date/Surgical Date 02/24/16   Prior Therapy Has been seen for vertigo in the past   Precautions   Precautions None   Balance Screen   Has the patient fallen in the past 6 months No   Has the patient had a decrease in activity level because of a fear of falling?  Yes   Is the  patient reluctant to leave their home because of a fear of falling?  Yes   [place] residence   Living Arrangements Alone   Type of [place] to enter   Entrance Stairs-Number of Steps 6   Entrance Stairs-Rails Can reach both;Left;Right   Home Layout One level   [place] None   Prior Function   Level of Independence Independent   Vocation Retired   ROM / Strength   AROM / PROM / Strength Strength   Strength   Overall Strength Within functional limits for tasks performed   Ambulation/Gait   Ambulation/Gait Yes   Ambulation/Gait Assistance 6: Modified independent (Device/Increase time)   Ambulation Distance (Feet) 50 Feet   Assistive device None   Gait Pattern Step-through pattern;Decreased stride length;Decreased step length - left;Decreased step length - right   Ambulation Surface Level;Indoor           Vestibular Assessment - 02/27/16 0850    Symptom Behavior   Type of Dizziness Spinning   Duration of Dizziness Short   Aggravating Factors Supine to sit;Turning head quickly   Relieving Factors Closing eyes   Occulomotor Exam   Occulomotor Alignment Normal   Spontaneous Absent   Gaze-induced Absent   Smooth  Pursuits Intact   Saccades Intact  2 beats vertical, WNL   Vestibulo-Occular Reflex   VOR to Slow Head Movement Normal   VOR Cancellation Normal   Comment Head Impulse test positive for corrective saccade bilaterally   Positional Testing   Dix-Hallpike Dix-Hallpike Right;Dix-Hallpike Left   Sidelying Test --   Horizontal Canal Testing Horizontal Canal Right;Horizontal Canal Left   Dix-Hallpike Right   Dix-Hallpike Right Duration <10 seconds   Dix-Hallpike Right Symptoms Upbeat, right rotatory nystagmus  large amplitude   Dix-Hallpike Left   Dix-Hallpike Left Symptoms No nystagmus   Horizontal Canal Right   Horizontal Canal Right Symptoms Normal   Horizontal Canal Left   Horizontal Canal Left  Symptoms Normal   Cognition   Cognition Orientation Level Oriented x 4           Vestibular Treatment/Exercise - 02/27/16 0001    Vestibular Treatment/Exercise   Vestibular Treatment Provided Canalith Repositioning   Canalith Repositioning Epley Manuever Right    EPLEY MANUEVER RIGHT   Number of Reps  3   Overall Response Improved Symptoms   Response Details  Performed Epley maneuver R x3 reps. R upbeating torsional nystagmus with supine head turn R [employer] starting position). Pt with reports of dizziness/nausea upon moving L sidelying to sitting, duration <15 seconds. During second trial, pt attempted to skip L sidelying position but able to get into position with verbal and tactile prompting from PT. Pt reportes improved symptoms - decreased dizziness/nausea - after third trial of Epley maneuver.           PT Education - 02/27/16 0858    Education provided Yes   Education Details Pt educated on BPPV and how this may cause vertigo. Described pathomechanics and anatomy of BPPV, Epley maneuver, and reasoning behind Epley maneuver.   Person(s) Educated Patient   Methods Explanation;Verbal cues;Tactile cues   Comprehension Verbalized understanding          PT Short Term Goals - 02/27/16 0908    PT SHORT TERM GOAL #1   Title STGs=LTGs           PT Long Term Goals - 02/27/16 0908    PT LONG TERM GOAL #1   Title The pt will demonstrate understanding of HEP to maximize functional gains made in PT.   Baseline TARGET DATE = 03/28/16   Time 4   Period Weeks   Status New   PT LONG TERM GOAL #2   Title The pt will demonstrate subjective improvement in dizziness/nausea and no nystagmus will be observed with positional testing to demonstrate resolution of BPPV to improve dizziness/vertigo.   Time 4   Period Weeks   Status New   PT LONG TERM GOAL #3   Title The pt will verbalize understanding of BPPV and the role it plays in vertigo/dizziness.   Time 4   Period Weeks    Status New           Plan - 02/27/16 0900    Clinical Impression Statement The pt is a 82 y.o. M with PMH of prostate CA, HTN, and DM II evaluated today for complaints of acute dizziness/vertigo. 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 The pt was treated with R Epley maneuver x3 and reported subjective improvement in symptoms and no nystagmus upon the third maneuver. The pt would benefit from skilled PT in order  to address dizziness, gait, and balance in order to return the pt to his prior level of independence and activity.   Pt will benefit from skilled therapeutic intervention in order to improve on the following deficits Abnormal gait;Decreased balance;Dizziness   Rehab Potential Good   PT Frequency 1x / week   PT Duration 4 weeks  Will likely D/C before 4 weeks due to in-session improvement in symptoms    PT Treatment/Interventions ADLs/Self Care Home Management;Canalith Repostioning;Gait training;Stair training;Functional mobility training;Therapeutic activities;Therapeutic exercise;Balance training;Neuromuscular re-education;Patient/family education;Manual techniques;Vestibular   PT Next Visit Plan Re-assess positional testing, HEP for [facility] Daroff/habituation/gaze, handout for BPPV, D/C if symptoms resolved?   Consulted and Agree with Plan of Care Patient       Problem List There are no active problems to display for this patient.   De Nurse, SPT 02/27/2016, 9:12 AM  [facility] [address] [place], [place], [address] Phone: [phone]   Fax:  [phone]  Name: RIGDON RECCHIA MRN: [id number] Date of Birth: 1929/07/06

## 2016-03-02 ENCOUNTER — Ambulatory Visit: Payer: Medicare Other | Attending: Geriatric Medicine | Admitting: Rehabilitative and Restorative Service Providers"

## 2016-03-02 DIAGNOSIS — R42 Dizziness and giddiness: Secondary | ICD-10-CM | POA: Insufficient documentation

## 2016-03-02 DIAGNOSIS — R269 Unspecified abnormalities of gait and mobility: Secondary | ICD-10-CM

## 2016-03-02 DIAGNOSIS — R2681 Unsteadiness on feet: Secondary | ICD-10-CM | POA: Diagnosis not present

## 2016-03-02 NOTE — Patient Instructions (Signed)
Sit to Side-Lying   Sit on edge of bed. Lie down onto the right side and hold until dizziness stops, plus 20 seconds.  Return to sitting and wait until dizziness stops, plus 20 seconds.  Repeat to the left side. Repeat sequence 5 times per session. Do 2 sessions per day.  Copyright  VHI. All rights reserved.  Gaze Stabilization: Tip Card 1.Target must remain in focus, not blurry, and appear stationary while head is in motion. 2.Perform exercises with small head movements (45 to either side of midline). 3.Increase speed of head motion so long as target is in focus. 4.If you wear eyeglasses, be sure you can see target through lens (therapist will give specific instructions for bifocal / progressive lenses). 5.These exercises may provoke dizziness or nausea. Work through these symptoms. If too dizzy, slow head movement slightly. Rest between each exercise. 6.Exercises demand concentration; avoid distractions. 7.For safety, perform standing exercises close to a counter, wall, corner, or next to someone.  Copyright  VHI. All rights reserved.  Gaze Stabilization: Standing Feet Apart   Feet shoulder width apart, keeping eyes on target on wall 3 feet away, tilt head down slightly and move head side to side for 30 seconds. Repeat while moving head up and down for 30 seconds. Do 2 sessions per day.   Copyright  VHI. All rights reserved.

## 2016-03-02 NOTE — Therapy (Signed)
[facility] [address] [place], [place], [address] Phone: [phone]   Fax:  [phone]  Physical Therapy Treatment  Patient Details  Name: Carl Strong MRN: [id number] Date of Birth: December 23, 1929 Referring Provider: Lajean Manes  Encounter Date: 03/02/2016      PT End of Session - 03/02/16 1500    Visit Number 2   Number of Visits 5   Date for PT Re-Evaluation 03/28/16   Authorization Type BCBS Medicare   Authorization Time Period G codes every 10th visit   PT Start Time 1403   PT Stop Time 1426   PT Time Calculation (min) 23 min   Activity Tolerance Patient tolerated treatment well   Behavior During Therapy [facility] for tasks assessed/performed      No past medical history on file.  No past surgical history on file.  There were no vitals filed for this visit.  Visit Diagnosis:  Dizziness and giddiness  Unsteadiness  Abnormality of gait      Subjective Assessment - 03/02/16 1405    Subjective Pt arrives today stating that he is "feeling good." Says he had feelings of off-balance on Wednesday. He performed Brandt-Daroff maneuver on his own which helped symptoms resolve   Pertinent History Prostate CA, HTN, DM II   Patient Stated Goals "I'd like to get rid of this dizziness and get back to feeling comfortable going out."           Vestibular Assessment - 03/02/16 1508    Dix-Hallpike Right   Dix-Hallpike Right Duration <4 beats nystagmus   Dix-Hallpike Right Symptoms Upbeat, right rotatory nystagmus   Dix-Hallpike Left   Dix-Hallpike Left Symptoms No nystagmus   Sidelying Right   Sidelying Right Symptoms No nystagmus;Other (comment)  Used for f/u post Epley maneuver           Vestibular Treatment/Exercise - 03/02/16 1457    Vestibular Treatment/Exercise   Vestibular Treatment Provided Canalith Repositioning;Gaze   Canalith Repositioning Epley Manuever Right   Gaze Exercises X1 Viewing  Horizontal;X1 Viewing Vertical    EPLEY MANUEVER RIGHT   Number of Reps  1   Overall Response Improved Symptoms   Response Details  Pt with improvements in dizziness and symptoms following performance of R Epley maneuver. No nystagmus noted upon R Dix Hallpike maneuver, R sidelying maneuver with 2 pillows to facilitate inc cervical extension following 1 Epley maneuver.   X1 Viewing Horizontal   Foot Position Normal stance in standing   Reps 1   Comments pt required verbal cues to dec speed of movement in order to maintain focus of letter during performance of VOR.   X1 Viewing Vertical   Foot Position Normal base in standing   Reps 1   Comments Pt required min to no cueing for performance of vertical head turns.           PT Education - 03/02/16 1456    Education provided Yes   Education Details Pt given HEP for [employer] maneuver and VOR x1 vertical/horizontal head turns.   Person(s) Educated Patient   Methods Explanation;Demonstration;Tactile cues;Verbal cues;Handout   Comprehension Verbalized understanding;Returned demonstration;Verbal cues required;Tactile cues required          PT Short Term Goals - 02/27/16 0908    PT SHORT TERM GOAL #1   Title STGs=LTGs           PT Long Term Goals - 02/27/16 0908    PT LONG TERM GOAL #1  Title The pt will demonstrate understanding of HEP to maximize functional gains made in PT.   Baseline TARGET DATE = 03/28/16   Time 4   Period Weeks   Status New   PT LONG TERM GOAL #2   Title The pt will demonstrate subjective improvement in dizziness/nausea and no nystagmus will be observed with positional testing to demonstrate resolution of BPPV to improve dizziness/vertigo.   Time 4   Period Weeks   Status New   PT LONG TERM GOAL #3   Title The pt will verbalize understanding of BPPV and the role it plays in vertigo/dizziness.   Time 4   Period Weeks   Status New          Plan - 03/02/16 1501    Clinical Impression  Statement Patient seen today for follow up for R post canal BPPV. Pt had in-session improvements in symptoms during last session, however had increased feeling of off-balance shortly after. Today's session revealed R upbeating rotatory nystagmus <4 beats with R Dix-Hallpike manuever. The pt was treated accordingly with Epley maneuver and demonstrated subjective improvements in dizziness and had no nystagmus with follow-up positional testing. Implemented and reviewed HEP including Brandt Daroff maneuver and VOR exercises for proper performance in home. Pt to f/u in 1 week. Continue per POC.   Pt will benefit from skilled therapeutic intervention in order to improve on the following deficits Abnormal gait;Decreased balance;Dizziness   Rehab Potential Good   PT Frequency 1x / week   PT Duration 4 weeks   PT Treatment/Interventions ADLs/Self Care Home Management;Canalith Repostioning;Gait training;Stair training;Functional mobility training;Therapeutic activities;Therapeutic exercise;Balance training;Neuromuscular re-education;Patient/family education;Manual techniques;Vestibular   PT Next Visit Plan Re-assess R post canal/positional testing. Review HEP. Handout for BPPV. D/C if symptoms resolved?   Consulted and Agree with Plan of Care Patient        Problem List There are no active problems to display for this patient.   De Nurse, SPT 03/02/2016, 3:10 PM  [facility] [address] [place], [place], [address] Phone: [phone]   Fax:  [phone]  Name: Carl Strong MRN: [id number] Date of Birth: 1929-01-27

## 2016-03-09 ENCOUNTER — Ambulatory Visit: Payer: Medicare Other | Admitting: Rehabilitative and Restorative Service Providers"

## 2016-03-09 DIAGNOSIS — R42 Dizziness and giddiness: Secondary | ICD-10-CM | POA: Diagnosis not present

## 2016-03-09 DIAGNOSIS — R269 Unspecified abnormalities of gait and mobility: Secondary | ICD-10-CM

## 2016-03-09 DIAGNOSIS — R2681 Unsteadiness on feet: Secondary | ICD-10-CM

## 2016-03-09 NOTE — Therapy (Signed)
[facility] [address] [place], [place], [address] Phone: [phone]   Fax:  [phone]  Physical Therapy Treatment  Patient Details  Name: Carl Strong MRN: [id number] Date of Birth: June 13, 1929 Referring Provider: Lajean Manes  Encounter Date: 03/09/2016      PT End of Session - 03/09/16 1106    Visit Number 3   Number of Visits 5   Date for PT Re-Evaluation 03/28/16   Authorization Type BCBS Medicare   Authorization Time Period G codes every 10th visit   PT Start Time 1100   PT Stop Time 1130   PT Time Calculation (min) 30 min   Activity Tolerance Patient tolerated treatment well   Behavior During Therapy [facility] for tasks assessed/performed      No past medical history on file.  No past surgical history on file.  There were no vitals filed for this visit.  Visit Diagnosis:  Abnormality of gait  Unsteadiness  Dizziness and giddiness      Subjective Assessment - 03/09/16 1101    Subjective The patient reports he is only dizzy when he performs exercises at home. He does not notice any dizziness during typical day to day tasks including bed mobility.  The patient is reporting double vision with gaze exercises at times.   Pertinent History Prostate CA, HTN, DM II   Patient Stated Goals "I'd like to get rid of this dizziness and get back to feeling comfortable going out."   Currently in Pain? No/denies                Vestibular Assessment - 03/09/16 1107    Vestibular Assessment   General Observation Patient notes double vision when looking to the right into rear view mirror.  He also notes double vision at times when watchingn TV,  Also notes double vision when performing gaze exercises x 1 viewing.   Symptom Behavior   Type of Dizziness Spinning   Duration of Dizziness 2-3 seconds   Aggravating Factors --  right sidelying, fatigues with repetition   Occulomotor Exam   Occulomotor  Alignment Normal   Spontaneous Absent   Gaze-induced Absent   Smooth Pursuits Intact   Saccades Intact   Comment H.I.N.T.S.=head impulse test is positive to both sides, nystagmus not viewed in room light, test of skew negative   Vestibulo-Occular Reflex   Comment head impulse test positive to both sides   Positional Testing   Sidelying Test Sidelying Right;Sidelying Left   Sidelying Right   Sidelying Right Duration 8 beats noted   Sidelying Right Symptoms Upbeat, right rotatory nystagmus            Vestibular Treatment/Exercise - 03/09/16 1114    Vestibular Treatment/Exercise   Vestibular Treatment Provided Habituation   Canalith Repositioning Epley Manuever Right   Habituation Exercises Brandt Daroff    EPLEY MANUEVER RIGHT   Number of Reps  1   Overall Response Improved Symptoms   Response Details  Performed Epley's right and then retested with R sidelying.  Did not note nystagmus when retesting, however patient's symptoms were fatiguing with repetition, so hard to determine if fatigued or if resolved.     Nestor Lewandowsky   Number of Reps  3   Symptom Description  fatigues completely by third repetition                 PT Short Term Goals - 02/27/16 0908    PT SHORT TERM GOAL #1  Title STGs=LTGs           PT Long Term Goals - 03/09/16 1200    PT LONG TERM GOAL #1   Title The pt will demonstrate understanding of HEP to maximize functional gains made in PT.   Baseline Met on 03/09/2016   Time 4   Period Weeks   Status Achieved   PT LONG TERM GOAL #2   Title The pt will demonstrate subjective improvement in dizziness/nausea and no nystagmus will be observed with positional testing to demonstrate resolution of BPPV to improve dizziness/vertigo.   Time 4   Period Weeks   Status On-going   PT LONG TERM GOAL #3   Title The pt will verbalize understanding of BPPV and the role it plays in vertigo/dizziness.   Baseline Met on 03/09/2016   Time 4   Period  Weeks   Status Achieved               Plan - 03/09/16 1201    Clinical Impression Statement Patient continues with mild sensation of dizziness mostly when performing HEP.  PT treated trace BPPV in R posterior canal.  Recommended patient continue HEP x 10-14 more days and f/u if needed with one further visit.   PT Next Visit Plan Re-assess R post canal/positional testing. Review HEP. Handout for BPPV. D/C if symptoms resolved?   Consulted and Agree with Plan of Care Patient        Problem List There are no active problems to display for this patient.   [place], [place] 03/09/2016, 12:03 PM  [facility] [address] [place], [place], [address] Phone: [phone]   Fax:  [phone]  Name: Carl Strong MRN: [id number] Date of Birth: [birth year]

## 2016-03-14 ENCOUNTER — Ambulatory Visit: Payer: Medicare Other | Admitting: Rehabilitative and Restorative Service Providers"

## 2016-03-21 ENCOUNTER — Ambulatory Visit: Payer: Medicare Other | Admitting: Rehabilitative and Restorative Service Providers"

## 2016-04-19 ENCOUNTER — Encounter: Payer: Self-pay | Admitting: Rehabilitative and Restorative Service Providers"

## 2016-04-19 NOTE — Therapy (Signed)
[facility] [address] [place], [place], [address] Phone: [phone]   Fax:  [phone]  Patient Details  Name: Carl Strong MRN: [id number] Date of Birth: 09-17-1929 Referring Provider:  No ref. provider found  Encounter Date: last encounter 03/09/2016  PHYSICAL THERAPY DISCHARGE SUMMARY  Visits from Start of Care: 3  Current functional level related to goals / functional outcomes:     PT Long Term Goals - 03/09/16 1200    PT LONG TERM GOAL #1   Title The pt will demonstrate understanding of HEP to maximize functional gains made in PT.   Baseline Met on 03/09/2016   Time 4   Period Weeks   Status Achieved   PT LONG TERM GOAL #2   Title The pt will demonstrate subjective improvement in dizziness/nausea and no nystagmus will be observed with positional testing to demonstrate resolution of BPPV to improve dizziness/vertigo.   Time 4   Period Weeks   Status On-going   PT LONG TERM GOAL #3   Title The pt will verbalize understanding of BPPV and the role it plays in vertigo/dizziness.   Baseline Met on 03/09/2016   Time 4   Period Weeks   Status Achieved     *remaining LTG # 2 not reassessed as patient cx remaining visits due to symptoms resolved.   Remaining deficits: None per phone message   Education / Equipment: HEP for self mgmt of BPPV  Plan: Patient agrees to discharge.  Patient goals were partially met. Patient is being discharged due to meeting the stated rehab goals.  ?????               G-Codes - 05/06/2016 1336    Functional Assessment Tool Used R BPPV-patient cx remaining visits as symtpoms cleared   Functional Limitation Self care   Self Care Goal Status ([id number]) At least 1 percent but less than 20 percent impaired, limited or restricted   Self Care Discharge Status [phone]) At least 1 percent but less than 20 percent impaired, limited or restricted      Thank you for the referral  of this patient. Rudell Cobb, MPT   Jahquez Steffler May 06, 2016, 1:36 PM  [facility] [address] [place], [place], [address] Phone: [phone]   Fax:  [phone]

## 2016-05-29 DIAGNOSIS — I10 Essential (primary) hypertension: Secondary | ICD-10-CM | POA: Diagnosis not present

## 2016-05-29 DIAGNOSIS — E1142 Type 2 diabetes mellitus with diabetic polyneuropathy: Secondary | ICD-10-CM | POA: Diagnosis not present

## 2016-05-29 DIAGNOSIS — R200 Anesthesia of skin: Secondary | ICD-10-CM | POA: Diagnosis not present

## 2016-05-29 DIAGNOSIS — Z7984 Long term (current) use of oral hypoglycemic drugs: Secondary | ICD-10-CM | POA: Diagnosis not present

## 2016-05-29 DIAGNOSIS — Z79899 Other long term (current) drug therapy: Secondary | ICD-10-CM | POA: Diagnosis not present

## 2016-05-29 DIAGNOSIS — M79675 Pain in left toe(s): Secondary | ICD-10-CM | POA: Diagnosis not present

## 2016-06-05 ENCOUNTER — Ambulatory Visit: Payer: Medicare Other | Attending: Geriatric Medicine | Admitting: Rehabilitative and Restorative Service Providers"

## 2016-06-05 DIAGNOSIS — R2689 Other abnormalities of gait and mobility: Secondary | ICD-10-CM | POA: Diagnosis not present

## 2016-06-05 DIAGNOSIS — R2681 Unsteadiness on feet: Secondary | ICD-10-CM | POA: Insufficient documentation

## 2016-06-05 DIAGNOSIS — R42 Dizziness and giddiness: Secondary | ICD-10-CM | POA: Diagnosis not present

## 2016-06-05 NOTE — Therapy (Signed)
[facility] [address] [place], [place], [address] Phone: [phone]   Fax:  [phone]  Physical Therapy Evaluation  Patient Details  Name: Carl Strong MRN: [id number] Date of Birth: 21-Feb-1929 Referring Provider: Lajean Manes, MD  Encounter Date: 06/05/2016      PT End of Session - 06/05/16 1103    Visit Number 1   Number of Visits 5   Date for PT Re-Evaluation 07/05/16   Authorization Type BCBS [employer] HMO- G code every 10th visit   PT Start Time 1018   PT Stop Time 1103   PT Time Calculation (min) 45 min      No past medical history on file.  No past surgical history on file.  There were no vitals filed for this visit.       Subjective Assessment - 06/05/16 1020    Subjective The patient is known to our clinic from previous treatment for vertigo.  The patient reports that the first encounter of PT got rid of dizziness.  The second encounter (in 02/2016) for dizziness patient reports that it initially stopped, but gradually returned.  He c/o imbalance and worsening symptoms in the mornings.  He reports that the exercises PT provided still provoke symptoms.  "It never turns into a full blown episode, it starts to come on and stops."  For balance, he reports a sensation of a magnet pulling him.     Pertinent History Prostate CA, HTN, DM II   Patient Stated Goals Improve balance "this is my biggest concern."   Currently in Pain? No/denies            [facility] PT Assessment - 06/05/16 1026    Assessment   Medical Diagnosis vertigo, recurrent   Referring Provider Lajean Manes, MD   Prior Therapy known to our clinic from tx for vertigo   Precautions   Precautions None   Balance Screen   Has the patient fallen in the past 6 months No   Has the patient had a decrease in activity level because of a fear of falling?  No   Is the patient reluctant to leave their home because of a fear of falling?  No   Home  Environment   Living Environment Private residence   Living Arrangements Alone   Type of [place] to enter   Entrance Stairs-Number of Steps --  6   Entrance Stairs-Rails Can reach both;Left;Right   Home Layout One level   [place] None   Prior Function   Level of Independence Independent   Vocation Retired   Observation/Other Assessments   Focus on Therapeutic Outcomes (FOTO)  60%   Other Surveys  --  DHI=32%   Ambulation/Gait   Ambulation/Gait Yes   Ambulation/Gait Assistance 7: Independent   Ambulation Distance (Feet) 200 Feet   Assistive device None   Gait Pattern Step-through pattern   Ambulation Surface Level;Indoor   Gait velocity 2.53 ft/sec   Standardized Balance Assessment   Standardized Balance Assessment Berg Balance Test   Berg Balance Test   Sit to Stand Able to stand without using hands and stabilize independently   Standing Unsupported Able to stand safely 2 minutes   Sitting with Back Unsupported but Feet Supported on Floor or Stool Able to sit safely and securely 2 minutes   Stand to Sit Sits safely with minimal use of hands   Transfers Able to transfer safely, minor use of hands  Standing Unsupported with Eyes Closed Able to stand 10 seconds safely   Standing Ubsupported with Feet Together Able to place feet together independently and stand 1 minute safely   From Standing, Reach Forward with Outstretched Arm Can reach confidently >25 cm (10")   From Standing Position, Pick up Object from Floor Able to pick up shoe safely and easily   From Standing Position, Turn to Look Behind Over each Shoulder Turn sideways only but maintains balance   Turn 360 Degrees Able to turn 360 degrees safely in 4 seconds or less   Standing Unsupported, Alternately Place Feet on Step/Stool Able to stand independently and safely and complete 8 steps in 20 seconds   Standing Unsupported, One Foot in Front Able to plae foot ahead of the other  independently and hold 30 seconds   Standing on One Leg Able to lift leg independently and hold equal to or more than 3 seconds   Total Score 51   Berg comment: 51/56    Functional Gait  Assessment   Gait assessed  Yes   Gait Level Surface Walks 20 ft, slow speed, abnormal gait pattern, evidence for imbalance or deviates 10-15 in outside of the 12 in walkway width. Requires more than 7 sec to ambulate 20 ft.   Change in Gait Speed Able to change speed, demonstrates mild gait deviations, deviates 6-10 in outside of the 12 in walkway width, or no gait deviations, unable to achieve a major change in velocity, or uses a change in velocity, or uses an assistive device.   Gait with Horizontal Head Turns Performs head turns smoothly with slight change in gait velocity (eg, minor disruption to smooth gait path), deviates 6-10 in outside 12 in walkway width, or uses an assistive device.   Gait with Vertical Head Turns Performs task with slight change in gait velocity (eg, minor disruption to smooth gait path), deviates 6 - 10 in outside 12 in walkway width or uses assistive device   Gait and Pivot Turn Pivot turns safely in greater than 3 sec and stops with no loss of balance, or pivot turns safely within 3 sec and stops with mild imbalance, requires small steps to catch balance.   Step Over Obstacle Is able to step over one shoe box (4.5 in total height) without changing gait speed. No evidence of imbalance.   Gait with Narrow Base of Support Ambulates less than 4 steps heel to toe or cannot perform without assistance.   Gait with Eyes Closed Cannot walk 20 ft without assistance, severe gait deviations or imbalance, deviates greater than 15 in outside 12 in walkway width or will not attempt task.   Ambulating Backwards Walks 20 ft, slow speed, abnormal gait pattern, evidence for imbalance, deviates 10-15 in outside 12 in walkway width.   Steps Alternating feet, must use rail.   Total Score 14   FGA comment:  14/30            Vestibular Assessment - 06/05/16 1029    Vestibular Assessment   General Observation Patient walks independently into clinic without device.  He has report of double vision at end range of gaze and at time swhen watching TV.  Patient took meclizine this morning.   Symptom Behavior   Type of Dizziness Imbalance   Frequency of Dizziness --  intermittent   Duration of Dizziness --  varies   Aggravating Factors Activity in general   Occulomotor Exam   Occulomotor Alignment Normal   Spontaneous Absent  Gaze-induced Absent   Smooth Pursuits Intact   Saccades Intact   Vestibulo-Occular Reflex   VOR 1 Head Only (x 1 viewing) slow VOR provokes c/o double vision when turning 30 degrees to each side   VOR to Slow Head Movement Normal   Comment head impulse test positive to both sides for refixation saccade.   Visual Acuity   Dynamic 6 line difference between static and dynamic visual acuity indicating diminished VOR   Positional Testing   Dix-Hallpike Dix-Hallpike Right;Dix-Hallpike Left   Sidelying Test Sidelying Right;Sidelying Left   Horizontal Canal Testing Horizontal Canal Right;Horizontal Canal Left   Dix-Hallpike Right   Dix-Hallpike Right Duration 10 seconds   Dix-Hallpike Right Symptoms Upbeat, right rotatory nystagmus   Dix-Hallpike Left   Dix-Hallpike Left Duration none   Dix-Hallpike Left Symptoms No nystagmus   Sidelying Right   Sidelying Right Duration 10 seconds   Sidelying Right Symptoms Upbeat, right rotatory nystagmus  provokes 5/10 symptoms   Sidelying Left   Sidelying Left Duration trace   Sidelying Left Symptoms --  direction not differentiated, resolved quickly   Horizontal Canal Right   Horizontal Canal Right Duration none   Horizontal Canal Right Symptoms Normal   Horizontal Canal Left   Horizontal Canal Left Duration none   Horizontal Canal Left Symptoms Normal                Vestibular Treatment/Exercise - 06/05/16 1046     Vestibular Treatment/Exercise   Vestibular Treatment Provided Canalith Repositioning;Habituation   Canalith Repositioning Epley Manuever Right   Habituation Exercises Brandt Daroff    EPLEY MANUEVER RIGHT   Number of Reps  1                 PT Short Term Goals - 02/27/16 0908    PT SHORT TERM GOAL #1   Title STGs=LTGs           PT Long Term Goals - 06/05/16 1224    PT LONG TERM GOAL #1   Title The patient will be indep with HEP for habituation, balance and gaze activities.   Baseline Target date 07/05/2016   Time 4   Period Weeks   PT LONG TERM GOAL #2   Title The patient will improve functional gait assessment from 14/30 to > or equal to 20/30 to demo decreasing risk for falls.   Baseline Target date 07/05/2016   Time 4   Period Weeks   PT LONG TERM GOAL #3   Title The patient will improve gait speed from 2.53 ft/sec to > or equal to 3.0 ft/sec to demo improving functional mobiliy.   Baseline Target date 07/05/2016   Time 4   Period Weeks   PT LONG TERM GOAL #4   Title The patient will maintain single limb stance for 10 seconds on each leg for improved balance control.    Baseline Target date 07/05/2016   Time 4   Period Weeks   PT LONG TERM GOAL #5   Title Reduce DHI from 32% to < or equal to 18% to demo dec'd self perception of dizziness.   Baseline Target date 07/05/2016   Time 4   Period Weeks               Plan - 06/05/16 1333    Clinical Impression Statement The patient is an 82 year old male known to our clinic from prior course of therapy for BPPV.  He presents today with positional testing positive for R  posterior canal BPPV, positive bilateral head thrust test indicating dec'd use of vestibular ocular reflex, and dec'd dynamic gait.  PT to treat to optimize current functional status.   Rehab Potential Good   PT Frequency 1x / week   PT Duration 4 weeks   PT Treatment/Interventions ADLs/Self Care Home Management;Canalith Repostioning;Gait  training;Stair training;Functional mobility training;Therapeutic activities;Therapeutic exercise;Balance training;Neuromuscular re-education;Patient/family education;Manual techniques;Vestibular   PT Next Visit Plan Check R BPPV, add HEP (currently held): balance, gait, VOR, habituation   Consulted and Agree with Plan of Care Patient      Patient will benefit from skilled therapeutic intervention in order to improve the following deficits and impairments:  Abnormal gait, Decreased balance, Dizziness  Visit Diagnosis: Dizziness and giddiness  Unsteadiness  Other abnormalities of gait and mobility      G-Codes - June 29, 2016 1334    Functional Assessment Tool Used DHI=32%   Functional Limitation Self care   Self Care Current Status [id number]) At least 20 percent but less than 40 percent impaired, limited or restricted   Self Care Goal Status [id number]) At least 1 percent but less than 20 percent impaired, limited or restricted       Problem List There are no active problems to display for this patient.   [place], [place] June 29, 2016, 1:35 PM  [facility] [address] [place], [place], [address] Phone: [phone]   Fax:  [phone]  Name: Carl Strong MRN: [id number] Date of Birth: [birth year]

## 2016-06-07 DIAGNOSIS — G629 Polyneuropathy, unspecified: Secondary | ICD-10-CM | POA: Diagnosis not present

## 2016-06-07 DIAGNOSIS — G5600 Carpal tunnel syndrome, unspecified upper limb: Secondary | ICD-10-CM | POA: Diagnosis not present

## 2016-06-15 ENCOUNTER — Ambulatory Visit: Payer: Medicare Other | Admitting: Rehabilitative and Restorative Service Providers"

## 2016-06-15 VITALS — BP 153/58

## 2016-06-15 DIAGNOSIS — R2681 Unsteadiness on feet: Secondary | ICD-10-CM

## 2016-06-15 DIAGNOSIS — R42 Dizziness and giddiness: Secondary | ICD-10-CM | POA: Diagnosis not present

## 2016-06-15 DIAGNOSIS — R2689 Other abnormalities of gait and mobility: Secondary | ICD-10-CM

## 2016-06-15 NOTE — Therapy (Signed)
[facility] [address] [place], [place], [address] Phone: [phone]   Fax:  [phone]  Physical Therapy Treatment  Patient Details  Name: Carl Strong MRN: [id number] Date of Birth: 1929-12-06 Referring Provider: Lajean Manes, MD  Encounter Date: 06/15/2016      PT End of Session - 06/15/16 0832    Visit Number 2   Number of Visits 5   Date for PT Re-Evaluation 07/05/16   Authorization Type [employer] HMO- G code every 10th visit   PT Start Time 0800   PT Stop Time 0839   PT Time Calculation (min) 39 min   Activity Tolerance Patient tolerated treatment well   Behavior During Therapy [facility] for tasks assessed/performed      No past medical history on file.  No past surgical history on file.  Filed Vitals:   06/15/16 0832  BP: 153/58        Subjective Assessment - 06/15/16 0800    Subjective The patient reports he continues to feel wobbly and notes some mild sensation of vertigo with getting in/out of bed.     Patient Stated Goals Improve balance "this is my biggest concern."   Currently in Pain? No/denies                Vestibular Assessment - 06/15/16 0804    Vestibular Assessment   General Observation Patient walks into clinic without device independently.   Positional Testing   Sidelying Test Sidelying Right   Sidelying Right   Sidelying Right Duration x 10 seconds   Sidelying Right Symptoms Upbeat, right rotatory nystagmus                 [facility] Adult PT Treatment/Exercise - 06/15/16 0824    Ambulation/Gait   Ambulation/Gait Yes   Gait Comments Community gait activities including grassy surfaces, rubber mulch and turns on grass.  Patient independent without any loss of balance noted.    Neuro Re-ed    Neuro Re-ed Details  The patient performed foam standing with eyes closed initially beginning feet apart working towards feet together.   At end of session while PT  discussing home exercises, patient experienced sensation of spinning that lasted x 1 minute and BP Checked.  See vitals.          Vestibular Treatment/Exercise - 06/15/16 0807    Vestibular Treatment/Exercise   Vestibular Treatment Provided Canalith Repositioning   Canalith Repositioning --  liberatory maneuver   Habituation Exercises Standing Horizontal Head Turns;Standing Vertical Head Turns   Gaze Exercises X1 Viewing Horizontal;X1 Viewing Vertical    EPLEY MANUEVER RIGHT   Response Details  *Performed liberatory maneuver for R posterior canal BPPV x 1 rep.  Retested R sidelying without nystagmus noted.    Nestor Lewandowsky   Number of Reps  3   Symptom Description  performed R sidelying intermittently t/o session.   Standing Horizontal Head Turns   Number of Reps  10   Symptom Description  initially began with feet apart>feet together>partial heel/toe   Standing Vertical Head Turns   Number of Reps  10   Symptom Description  performed partial heel/toe               PT Education - 06/15/16 0831    Education provided Yes   Education Details HEP: vor x 1 adaptation horiz/vertical standing with feet apart, partial heel/toe with head motion, foam with eyes closed with feet together   Person(s) Educated Patient  Methods Explanation;Demonstration;Handout   Comprehension Verbalized understanding;Returned demonstration          PT Short Term Goals - 02/27/16 0908    PT SHORT TERM GOAL #1   Title STGs=LTGs           PT Long Term Goals - 06/05/16 1224    PT LONG TERM GOAL #1   Title The patient will be indep with HEP for habituation, balance and gaze activities.   Baseline Target date 07/05/2016   Time 4   Period Weeks   PT LONG TERM GOAL #2   Title The patient will improve functional gait assessment from 14/30 to > or equal to 20/30 to demo decreasing risk for falls.   Baseline Target date 07/05/2016   Time 4   Period Weeks   PT LONG TERM GOAL #3   Title The  patient will improve gait speed from 2.53 ft/sec to > or equal to 3.0 ft/sec to demo improving functional mobiliy.   Baseline Target date 07/05/2016   Time 4   Period Weeks   PT LONG TERM GOAL #4   Title The patient will maintain single limb stance for 10 seconds on each leg for improved balance control.    Baseline Target date 07/05/2016   Time 4   Period Weeks   PT LONG TERM GOAL #5   Title Reduce DHI from 32% to < or equal to 18% to demo dec'd self perception of dizziness.   Baseline Target date 07/05/2016   Time 4   Period Weeks               Plan - 06/15/16 0840    Clinical Impression Statement The patient continues with persistent, trace R BPPV per positional testing.  PT tried alternate canolith repositioning.  Also focused on home program for balance and gaze activities.    PT Treatment/Interventions ADLs/Self Care Home Management;Canalith Repostioning;Gait training;Stair training;Functional mobility training;Therapeutic activities;Therapeutic exercise;Balance training;Neuromuscular re-education;Patient/family education;Manual techniques;Vestibular   PT Next Visit Plan Check R BPPV, check HEP, balance, gait, VOR, habituation if indicated   Consulted and Agree with Plan of Care Patient      Patient will benefit from skilled therapeutic intervention in order to improve the following deficits and impairments:  Abnormal gait, Decreased balance, Dizziness  Visit Diagnosis: Dizziness and giddiness  Unsteadiness  Other abnormalities of gait and mobility     Problem List There are no active problems to display for this patient.   [place], [place] 06/15/2016, 8:41 AM  [facility] [address] [place], [place], [address] Phone: [phone]   Fax:  [phone]  Name: Carl Strong MRN: [id number] Date of Birth: [birth year]

## 2016-06-15 NOTE — Patient Instructions (Signed)
  Gaze Stabilization: Tip Card 1.Target must remain in focus, not blurry, and appear stationary while head is in motion. 2.Perform exercises with small head movements (45 to either side of midline). 3.Increase speed of head motion so long as target is in focus. 4.If you wear eyeglasses, be sure you can see target through lens (therapist will give specific instructions for bifocal / progressive lenses). 5.These exercises may provoke dizziness or nausea. Work through these symptoms. If too dizzy, slow head movement slightly. Rest between each exercise. 6.Exercises demand concentration; avoid distractions. 7.For safety, perform standing exercises close to a counter, wall, corner, or next to someone.  Copyright  VHI. All rights reserved.  Gaze Stabilization: Standing Feet Apart   Feet shoulder width apart, keeping eyes on target on wall 3 feet away, tilt head down slightly and move head side to side for 30 seconds. Repeat while moving head up and down for 30 seconds. Do 2 sessions per day.   Copyright  VHI. All rights reserved.   Feet Together (Compliant Surface) Varied Arm Positions - Eyes Closed    Stand on compliant surface: _pillow with feet together and arms out. Close eyes and visualize upright position. Hold_30___ seconds. Repeat __3__ times per session. Do __1-2__ sessions per day.  Copyright  VHI. All rights reserved.  Feet Partial Heel-Toe, Head Motion - Eyes Open    With eyes open, right foot partially in front of the other, move head slowly: up and down x 10 times.  Switch feet and move head side to side x 10 times. Do _1-2___ sessions per day.  Copyright  VHI. All rights reserved.

## 2016-06-18 ENCOUNTER — Telehealth: Payer: Self-pay | Admitting: Rehabilitative and Restorative Service Providers"

## 2016-06-18 NOTE — Telephone Encounter (Signed)
Received in basket message for phone call to patient.  Left message.

## 2016-06-22 ENCOUNTER — Encounter: Payer: Medicare Other | Admitting: Rehabilitative and Restorative Service Providers"

## 2016-06-22 ENCOUNTER — Other Ambulatory Visit: Payer: Self-pay | Admitting: Orthopedic Surgery

## 2016-06-22 DIAGNOSIS — M19042 Primary osteoarthritis, left hand: Secondary | ICD-10-CM | POA: Diagnosis not present

## 2016-06-22 DIAGNOSIS — M79641 Pain in right hand: Secondary | ICD-10-CM | POA: Diagnosis not present

## 2016-06-22 DIAGNOSIS — M79642 Pain in left hand: Secondary | ICD-10-CM | POA: Diagnosis not present

## 2016-06-22 DIAGNOSIS — M180 Bilateral primary osteoarthritis of first carpometacarpal joints: Secondary | ICD-10-CM | POA: Diagnosis not present

## 2016-06-22 DIAGNOSIS — M19041 Primary osteoarthritis, right hand: Secondary | ICD-10-CM | POA: Diagnosis not present

## 2016-06-22 DIAGNOSIS — M47812 Spondylosis without myelopathy or radiculopathy, cervical region: Secondary | ICD-10-CM | POA: Diagnosis not present

## 2016-06-22 DIAGNOSIS — G5603 Carpal tunnel syndrome, bilateral upper limbs: Secondary | ICD-10-CM | POA: Diagnosis not present

## 2016-06-25 ENCOUNTER — Encounter (HOSPITAL_BASED_OUTPATIENT_CLINIC_OR_DEPARTMENT_OTHER)
Admission: RE | Admit: 2016-06-25 | Discharge: 2016-06-25 | Disposition: A | Discharge status: Home / Self Care | Payer: Medicare Other | Source: Ambulatory Visit | Attending: Orthopedic Surgery | Admitting: Orthopedic Surgery

## 2016-06-25 ENCOUNTER — Other Ambulatory Visit: Payer: Self-pay

## 2016-06-25 ENCOUNTER — Encounter (HOSPITAL_BASED_OUTPATIENT_CLINIC_OR_DEPARTMENT_OTHER): Payer: Self-pay | Admitting: *Deleted

## 2016-06-25 DIAGNOSIS — I10 Essential (primary) hypertension: Secondary | ICD-10-CM | POA: Diagnosis not present

## 2016-06-25 DIAGNOSIS — K219 Gastro-esophageal reflux disease without esophagitis: Secondary | ICD-10-CM | POA: Diagnosis not present

## 2016-06-25 DIAGNOSIS — Z7984 Long term (current) use of oral hypoglycemic drugs: Secondary | ICD-10-CM | POA: Diagnosis not present

## 2016-06-25 DIAGNOSIS — Z87891 Personal history of nicotine dependence: Secondary | ICD-10-CM | POA: Diagnosis not present

## 2016-06-25 DIAGNOSIS — G5601 Carpal tunnel syndrome, right upper limb: Secondary | ICD-10-CM | POA: Diagnosis not present

## 2016-06-25 DIAGNOSIS — E119 Type 2 diabetes mellitus without complications: Secondary | ICD-10-CM | POA: Diagnosis not present

## 2016-06-25 DIAGNOSIS — Z9049 Acquired absence of other specified parts of digestive tract: Secondary | ICD-10-CM | POA: Diagnosis not present

## 2016-06-25 DIAGNOSIS — Z8546 Personal history of malignant neoplasm of prostate: Secondary | ICD-10-CM | POA: Diagnosis not present

## 2016-06-25 LAB — BASIC METABOLIC PANEL
ANION GAP: 6 (ref 5–15)
BUN: 20 mg/dL (ref 6–20)
CHLORIDE: 106 mmol/L (ref 101–111)
CO2: 27 mmol/L (ref 22–32)
Calcium: 9.3 mg/dL (ref 8.9–10.3)
Creatinine, Ser: 1.09 mg/dL (ref 0.61–1.24)
GFR calc Af Amer: >60 mL/min (ref >60)
GFR, EST NON AFRICAN AMERICAN: 60 mL/min — AB (ref >60)
Glucose, Bld: 176 mg/dL — ABNORMAL HIGH (ref 65–99)
POTASSIUM: 4.7 mmol/L (ref 3.5–5.1)
SODIUM: 139 mmol/L (ref 135–145)

## 2016-06-26 NOTE — Progress Notes (Signed)
Ekg reviewed by Dr. Cherene Altes- ok for surgery.

## 2016-06-28 ENCOUNTER — Encounter (HOSPITAL_BASED_OUTPATIENT_CLINIC_OR_DEPARTMENT_OTHER): Payer: Self-pay | Admitting: Orthopedic Surgery

## 2016-06-28 ENCOUNTER — Ambulatory Visit (HOSPITAL_BASED_OUTPATIENT_CLINIC_OR_DEPARTMENT_OTHER)
Admission: RE | Admit: 2016-06-28 | Discharge: 2016-06-28 | Disposition: A | Discharge status: Home / Self Care | Payer: Medicare Other | Source: Ambulatory Visit | Attending: Orthopedic Surgery | Admitting: Orthopedic Surgery

## 2016-06-28 ENCOUNTER — Ambulatory Visit (HOSPITAL_BASED_OUTPATIENT_CLINIC_OR_DEPARTMENT_OTHER): Payer: Medicare Other | Admitting: Certified Registered"

## 2016-06-28 ENCOUNTER — Encounter (HOSPITAL_BASED_OUTPATIENT_CLINIC_OR_DEPARTMENT_OTHER)
Admission: RE | Disposition: A | Discharge status: Home / Self Care | Payer: Self-pay | Source: Ambulatory Visit | Attending: Orthopedic Surgery

## 2016-06-28 DIAGNOSIS — Z87891 Personal history of nicotine dependence: Secondary | ICD-10-CM | POA: Diagnosis not present

## 2016-06-28 DIAGNOSIS — K219 Gastro-esophageal reflux disease without esophagitis: Secondary | ICD-10-CM | POA: Insufficient documentation

## 2016-06-28 DIAGNOSIS — Z8546 Personal history of malignant neoplasm of prostate: Secondary | ICD-10-CM | POA: Insufficient documentation

## 2016-06-28 DIAGNOSIS — G5601 Carpal tunnel syndrome, right upper limb: Secondary | ICD-10-CM | POA: Diagnosis not present

## 2016-06-28 DIAGNOSIS — Z7984 Long term (current) use of oral hypoglycemic drugs: Secondary | ICD-10-CM | POA: Diagnosis not present

## 2016-06-28 DIAGNOSIS — I10 Essential (primary) hypertension: Secondary | ICD-10-CM | POA: Diagnosis not present

## 2016-06-28 DIAGNOSIS — Z9049 Acquired absence of other specified parts of digestive tract: Secondary | ICD-10-CM | POA: Insufficient documentation

## 2016-06-28 DIAGNOSIS — E119 Type 2 diabetes mellitus without complications: Secondary | ICD-10-CM | POA: Diagnosis not present

## 2016-06-28 HISTORY — DX: Gastro-esophageal reflux disease without esophagitis: K21.9

## 2016-06-28 HISTORY — DX: Essential (primary) hypertension: I10

## 2016-06-28 HISTORY — PX: CARPAL TUNNEL RELEASE: SHX101

## 2016-06-28 HISTORY — DX: Other specified disorders of bone density and structure, unspecified site: M85.80

## 2016-06-28 LAB — GLUCOSE, CAPILLARY
Glucose-Capillary: 187 mg/dL — ABNORMAL HIGH (ref 65–99)
Glucose-Capillary: 196 mg/dL — ABNORMAL HIGH (ref 65–99)

## 2016-06-28 SURGERY — CARPAL TUNNEL RELEASE
Anesthesia: Monitor Anesthesia Care | Site: Wrist | Laterality: Right

## 2016-06-28 MED ORDER — FENTANYL CITRATE (PF) 100 MCG/2ML IJ SOLN
50.0000 ug | INTRAMUSCULAR | Status: DC | PRN
  Administered 2016-06-28: 50 ug via INTRAVENOUS

## 2016-06-28 MED ORDER — FENTANYL CITRATE (PF) 100 MCG/2ML IJ SOLN
INTRAMUSCULAR | Status: AC
  Filled 2016-06-28: qty 2

## 2016-06-28 MED ORDER — LACTATED RINGERS IV SOLN
INTRAVENOUS | Status: DC
  Administered 2016-06-28: 09:00:00 via INTRAVENOUS

## 2016-06-28 MED ORDER — LIDOCAINE HCL (CARDIAC) 20 MG/ML IV SOLN
INTRAVENOUS | Status: DC | PRN
  Administered 2016-06-28: 20 mg via INTRAVENOUS

## 2016-06-28 MED ORDER — HYDROCODONE-ACETAMINOPHEN 5-325 MG PO TABS
1.0000 | ORAL_TABLET | Freq: Four times a day (QID) | ORAL | Status: DC | PRN
Start: 2016-06-28 — End: 2017-01-06

## 2016-06-28 MED ORDER — LIDOCAINE HCL (PF) 0.5 % IJ SOLN
INTRAMUSCULAR | Status: DC | PRN
  Administered 2016-06-28: 25 mL via INTRAVENOUS

## 2016-06-28 MED ORDER — FENTANYL CITRATE (PF) 100 MCG/2ML IJ SOLN: 25.0000 ug | INTRAMUSCULAR | Status: DC | PRN

## 2016-06-28 MED ORDER — PROPOFOL 10 MG/ML IV BOLUS
INTRAVENOUS | Status: DC | PRN
  Administered 2016-06-28: 30 mg via INTRAVENOUS
  Administered 2016-06-28: 20 mg via INTRAVENOUS

## 2016-06-28 MED ORDER — ONDANSETRON HCL 4 MG/2ML IJ SOLN
INTRAMUSCULAR | Status: DC | PRN
  Administered 2016-06-28: 4 mg via INTRAVENOUS

## 2016-06-28 MED ORDER — SCOPOLAMINE 1 MG/3DAYS TD PT72: 1.0000 | MEDICATED_PATCH | Freq: Once | TRANSDERMAL | Status: DC | PRN

## 2016-06-28 MED ORDER — BUPIVACAINE HCL (PF) 0.25 % IJ SOLN
INTRAMUSCULAR | Status: DC | PRN
  Administered 2016-06-28: 6 mL

## 2016-06-28 MED ORDER — CEFAZOLIN SODIUM-DEXTROSE 2-4 GM/100ML-% IV SOLN
INTRAVENOUS | Status: AC
  Filled 2016-06-28: qty 100

## 2016-06-28 MED ORDER — CEFAZOLIN SODIUM-DEXTROSE 2-4 GM/100ML-% IV SOLN
2.0000 g | INTRAVENOUS | Status: AC
  Administered 2016-06-28: 2 g via INTRAVENOUS

## 2016-06-28 MED ORDER — CHLORHEXIDINE GLUCONATE 4 % EX LIQD: 60.0000 mL | Freq: Once | CUTANEOUS | Status: DC

## 2016-06-28 MED ORDER — GLYCOPYRROLATE 0.2 MG/ML IJ SOLN
0.2000 mg | Freq: Once | INTRAMUSCULAR | Status: DC | PRN
Start: 2016-06-28 — End: 2016-06-28

## 2016-06-28 MED ORDER — MIDAZOLAM HCL 2 MG/2ML IJ SOLN: 1.0000 mg | INTRAMUSCULAR | Status: DC | PRN

## 2016-06-28 SURGICAL SUPPLY — 36 items
BLADE SURG 15 STRL LF DISP TIS (BLADE) ×1 IMPLANT
BLADE SURG 15 STRL SS (BLADE) ×2
BNDG CMPR 9X4 STRL LF SNTH (GAUZE/BANDAGES/DRESSINGS)
BNDG COHESIVE 3X5 TAN STRL LF (GAUZE/BANDAGES/DRESSINGS) ×2 IMPLANT
BNDG ESMARK 4X9 LF (GAUZE/BANDAGES/DRESSINGS) IMPLANT
BNDG GAUZE ELAST 4 BULKY (GAUZE/BANDAGES/DRESSINGS) ×2 IMPLANT
CHLORAPREP W/TINT 26ML (MISCELLANEOUS) ×2 IMPLANT
CORDS BIPOLAR (ELECTRODE) ×2 IMPLANT
COVER BACK TABLE 60X90IN (DRAPES) ×2 IMPLANT
COVER MAYO STAND STRL (DRAPES) ×2 IMPLANT
CUFF TOURNIQUET SINGLE 18IN (TOURNIQUET CUFF) ×2 IMPLANT
DRAPE EXTREMITY T 121X128X90 (DRAPE) ×2 IMPLANT
DRAPE SURG 17X23 STRL (DRAPES) ×2 IMPLANT
DRSG PAD ABDOMINAL 8X10 ST (GAUZE/BANDAGES/DRESSINGS) ×2 IMPLANT
GAUZE SPONGE 4X4 12PLY STRL (GAUZE/BANDAGES/DRESSINGS) ×2 IMPLANT
GAUZE XEROFORM 1X8 LF (GAUZE/BANDAGES/DRESSINGS) ×2 IMPLANT
GLOVE BIOGEL PI IND STRL 7.0 (GLOVE) IMPLANT
GLOVE BIOGEL PI IND STRL 8.5 (GLOVE) ×1 IMPLANT
GLOVE BIOGEL PI INDICATOR 7.0 (GLOVE) ×2
GLOVE BIOGEL PI INDICATOR 8.5 (GLOVE) ×2
GLOVE SURG ORTHO 8.0 STRL STRW (GLOVE) ×2 IMPLANT
GLOVE SURG SS PI 8.0 STRL IVOR (GLOVE) ×1 IMPLANT
GOWN STRL REUS W/ TWL LRG LVL3 (GOWN DISPOSABLE) ×1 IMPLANT
GOWN STRL REUS W/TWL LRG LVL3 (GOWN DISPOSABLE) ×4
GOWN STRL REUS W/TWL XL LVL3 (GOWN DISPOSABLE) ×2 IMPLANT
NDL PRECISIONGLIDE 27X1.5 (NEEDLE) IMPLANT
NEEDLE PRECISIONGLIDE 27X1.5 (NEEDLE) ×2 IMPLANT
NS IRRIG 1000ML POUR BTL (IV SOLUTION) ×2 IMPLANT
PACK BASIN DAY SURGERY FS (CUSTOM PROCEDURE TRAY) ×2 IMPLANT
STOCKINETTE 4X48 STRL (DRAPES) ×2 IMPLANT
SUT ETHILON 4 0 PS 2 18 (SUTURE) ×2 IMPLANT
SUT VICRYL 4-0 PS2 18IN ABS (SUTURE) IMPLANT
SYR BULB 3OZ (MISCELLANEOUS) ×2 IMPLANT
SYR CONTROL 10ML LL (SYRINGE) ×1 IMPLANT
TOWEL OR 17X24 6PK STRL BLUE (TOWEL DISPOSABLE) ×2 IMPLANT
UNDERPAD 30X30 (UNDERPADS AND DIAPERS) ×2 IMPLANT

## 2016-06-28 NOTE — Op Note (Signed)
#  [id number] 

## 2016-06-28 NOTE — H&P (Signed)
  Carl Strong is an 82 y.o. male.   Chief Complaint: numbness right hand HPI: Carl Strong is an 82 year old right hand dominant male seen with his son. He is referred by Dr. Felipa Eth for carpal tunnel syndrome. He is complaining of numbness and tingling in his right hand much greater than his left. This has been going on for six months. He states all of his fingers were involved. It is not constant. He is awakened 7/7 nights, he has no history of injury to the hand or to the neck. He states nothing seems to make it better or worse. He has not had any treatment for this. He does have a history of diabetes. There is no history of thyroid problems, arthritis or gout. He is also negative in his family history. He has had nerve conductions done by Dr. Orie Rout. These were performed on June 12th revealing bilateral carpal tunnel syndrome with a motor delay of 77 on the right, 48 on the left. Sensory delay 61 on the right and 34 on the left. These notes also reviewed along with Dr. Carlyle Lipa note.  Past Medical History:  Diagnosis Date  . Diabetes mellitus type II, controlled [facility])   Past Surgical History:  Procedure Laterality Date  . CHOLECYSTECTOMY  . PROSTATE SURGERY    Past Medical History  Diagnosis Date  . Hypertension   . Diabetes mellitus without complication ([place])   . GERD (gastroesophageal reflux disease)   . Osteopenia   . Cancer [facility])     prostate    Past Surgical History  Procedure Laterality Date  . Cholecystectomy    . Prostate surgery      History reviewed. No pertinent family history. Social History:  reports that he has quit smoking. He does not have any smokeless tobacco history on file. He reports that he does not drink alcohol or use illicit drugs.  Allergies: No Known Allergies  No prescriptions prior to admission    No results found for this or any previous visit (from the past 48 hour(s)).  No results found.   Pertinent items are noted in  HPI.  Height 5\' 11"  (1.803 m), weight 91.627 kg (202 lb).  General appearance: alert, cooperative and appears stated age Head: Normocephalic, without obvious abnormality Neck: no JVD Resp: clear to auscultation bilaterally Cardio: regular rate and rhythm, S1, S2 normal, no murmur, click, rub or gallop GI: soft, non-tender; bowel sounds normal; no masses,  no organomegaly Extremities: numb right hand Pulses: 2+ and symmetric Skin: Skin color, texture, turgor normal. No rashes or lesions Neurologic: Grossly normal Incision/Wound: na  Assessment/Plan Assessment:  1. Cervical spondylosis without myelopathy  2. Bilateral hand pain  3. Bilateral carpal tunnel syndrome  4. Primary osteoarthritis of both first carpometacarpal joints  5. Primary osteoarthritis of both hands    Plan: We have discussed with him that there is always potential that could have a double crush injury. We have discussed nerve changes with him and compressive neuropathies. We would recommend he seriously consider surgical decompression to the median nerve on his right side. Pre, peri and postoperative course were discussed along with risks and complications. He is aware that there is no guarantee with the surgery, the possibility of infection, recurrence, injury to arteries, nerves, tendons, incomplete relief of symptoms, dystrophy. He would like to proceed. He is scheduled for right carpel tunnel release in outpatient under regional anesthesia.   Celia Friedland R 06/28/2016, 4:54 AM

## 2016-06-28 NOTE — Anesthesia Procedure Notes (Signed)
Procedure Name: MAC Date/Time: 06/28/2016 9:30 AM Performed by: Baxter Flattery Pre-anesthesia Checklist: Patient identified, Emergency Drugs available, Suction available and Patient being monitored Patient Re-evaluated:Patient Re-evaluated prior to inductionOxygen Delivery Method: Simple face mask Preoxygenation: Pre-oxygenation with 100% oxygen Intubation Type: IV induction Ventilation: Mask ventilation without difficulty Dental Injury: Teeth and Oropharynx as per pre-operative assessment

## 2016-06-28 NOTE — Discharge Instructions (Addendum)

## 2016-06-28 NOTE — Brief Op Note (Signed)
06/28/2016  9:56 AM  PATIENT:  Carl Strong  82 y.o. male  PRE-OPERATIVE DIAGNOSIS:  RIGHT CARPAL TUNNEL SYNDROME  POST-OPERATIVE DIAGNOSIS:  RIGHT CARPAL TUNNEL SYNDROME  PROCEDURE:  Procedure(s) with comments: RIGHT CARPAL TUNNEL RELEASE (Right) - ANESTHESIA:  IV REGIONAL FAB  SURGEON:  Surgeon(s) and Role:    * Daryll Brod, MD - Primary  PHYSICIAN ASSISTANT:   ASSISTANTS: none   ANESTHESIA:   local and regional  EBL:  Total I/O In: 340 [I.V.:340] Out: 1 [Blood:1]  BLOOD ADMINISTERED:none  DRAINS: none   LOCAL MEDICATIONS USED:  BUPIVICAINE   SPECIMEN:  No Specimen  DISPOSITION OF SPECIMEN:  N/A  COUNTS:  YES  TOURNIQUET:   Total Tourniquet Time Documented: Forearm (Right) - 17 minutes Total: Forearm (Right) - 17 minutes   DICTATION: .Other Dictation: Dictation Number [id number]  PLAN OF CARE: Discharge to home after PACU  PATIENT DISPOSITION:  PACU - hemodynamically stable.

## 2016-06-28 NOTE — Transfer of Care (Signed)
Immediate Anesthesia Transfer of Care Note  Patient: Carl Strong  Procedure(s) Performed: Procedure(s) with comments: RIGHT CARPAL TUNNEL RELEASE (Right) - ANESTHESIA:  IV REGIONAL FAB  Patient Location: PACU  Anesthesia Type:MAC and Bier block  Level of Consciousness: awake, alert , oriented and patient cooperative  Airway & Oxygen Therapy: Patient Spontanous Breathing, RA O2 100%  Post-op Assessment: Report given to RN, Post -op Vital signs reviewed and stable and Patient moving all extremities  Post vital signs: Reviewed and stable  Last Vitals:  Filed Vitals:   06/28/16 0806  BP: 172/63  Pulse: 47  Temp: 36.7 C  Resp: 18    Last Pain: There were no vitals filed for this visit.       Complications: No apparent anesthesia complications

## 2016-06-28 NOTE — Anesthesia Postprocedure Evaluation (Signed)
Anesthesia Post Note  Patient: Carl Strong  Procedure(s) Performed: Procedure(s) (LRB): RIGHT CARPAL TUNNEL RELEASE (Right)  Patient location during evaluation: PACU Anesthesia Type: General Level of consciousness: awake and alert Pain management: pain level controlled Vital Signs Assessment: post-procedure vital signs reviewed and stable Respiratory status: spontaneous breathing, nonlabored ventilation and respiratory function stable Cardiovascular status: blood pressure returned to baseline and stable Postop Assessment: no signs of nausea or vomiting Anesthetic complications: no    Last Vitals:  Filed Vitals:   06/28/16 1025 06/28/16 1100  BP:  151/58  Pulse: 59 57  Temp:  36.4 C  Resp: 15 20    Last Pain:  Filed Vitals:   06/28/16 1107  PainSc: 0-No pain                 Domanique Luckett A

## 2016-06-28 NOTE — Anesthesia Preprocedure Evaluation (Addendum)
Anesthesia Evaluation  Patient identified by MRN, date of birth, ID band Patient awake    Reviewed: Allergy & Precautions, NPO status , Patient's Chart, lab work & pertinent test results  Airway Mallampati: I  TM Distance: >3 FB Neck ROM: Full    Dental  (+) Upper Dentures, Lower Dentures, Dental Advisory Given   Pulmonary former smoker,    breath sounds clear to auscultation       Cardiovascular hypertension, Pt. on medications  Rhythm:Regular Rate:Normal     Neuro/Psych    GI/Hepatic GERD  Medicated and Controlled,  Endo/Other  diabetes, Well Controlled, Type 2, Oral Hypoglycemic Agents  Renal/GU      Musculoskeletal   Abdominal   Peds  Hematology   Anesthesia Other Findings   Reproductive/Obstetrics                            Anesthesia Physical Anesthesia Plan  ASA: III  Anesthesia Plan: Bier Block and MAC   Post-op Pain Management:    Induction: Intravenous  Airway Management Planned: Simple Face Mask  Additional Equipment:   Intra-op Plan:   Post-operative Plan:   Informed Consent: I have reviewed the patients History and Physical, chart, labs and discussed the procedure including the risks, benefits and alternatives for the proposed anesthesia with the patient or authorized representative who has indicated his/her understanding and acceptance.   Dental advisory given  Plan Discussed with: CRNA, Anesthesiologist and Surgeon  Anesthesia Plan Comments:        Anesthesia Quick Evaluation

## 2016-06-29 ENCOUNTER — Encounter (HOSPITAL_BASED_OUTPATIENT_CLINIC_OR_DEPARTMENT_OTHER): Payer: Self-pay | Admitting: Orthopedic Surgery

## 2016-06-29 ENCOUNTER — Encounter: Payer: Medicare Other | Admitting: Rehabilitative and Restorative Service Providers"

## 2016-06-29 NOTE — Op Note (Signed)
NAMESALLY, CARLYON              ACCOUNT NO.:  [id number]  MEDICAL RECORD NO.:  [id number]  LOCATION:                                FACILITY:  [facility]  PHYSICIAN:  Daryll Brod, M.D.       DATE OF BIRTH:  11-26-1929  DATE OF PROCEDURE:  06/28/2016 DATE OF DISCHARGE:  06/28/2016                              OPERATIVE REPORT   PREOPERATIVE DIAGNOSIS:  Carpal tunnel syndrome, right hand.  POSTOPERATIVE DIAGNOSIS:  Carpal tunnel syndrome, right hand.  OPERATION:  Decompression of right median nerve.  SURGEON:  Daryll Brod, M.D.  ANESTHESIA:  Forearm-based IV regional with local infiltration.  ANESTHESIOLOGIST:  Lorrene Reid, M.D.  HISTORY:  The patient is an 82 year old male with a history of carpal tunnel syndrome, nerve conduction is positive.  He has elected to undergo surgical decompression of the median nerve.  Pre, peri, and postoperative course have been discussed along with risks and complications.  He is aware that there was no guarantee with the surgery; the possibility of infection; recurrence of injury to arteries, nerves, tendons; incomplete relief of symptoms and dystrophy.  In the preoperative area, the patient is seen, the extremity marked by both patient and surgeon and antibiotic given.  PROCEDURE IN DETAIL:  The patient was brought to the operating room where a forearm-based IV regional anesthetic was carried out without difficulty.  He was prepped using ChloraPrep, supine position with the right arm free.  A 3-minute dry time was allowed.  Time-out taken, confirming the patient and procedure.  A longitudinal incision was made in the right palm, carried down through the subcutaneous tissue. Bleeders were electrocauterized, palmar fascia was split.  Superficial palmar arch identified.  The flexor tendon to the ring and little finger identified.  Retractor was placed protecting the median nerve radially and the ulnar nerve ulnarly.  The flexor retinaculum was then  incised with sharp dissection.  Right angle and Sewall retractor were placed between the skin and forearm fascia.  The fascia was then released after separation of the fascia from underlying tissue and the overlying subcutaneous tissue with blunt scissors.  The forearm fascia was released for approximately 2-3 cm proximal to the wrist crease under direct vision.  Canal was explored.  Motor branch entered into muscle distally.  Persistent median artery was present.  No further lesions were identified.  The wound was copiously irrigated with saline and the skin closed with interrupted 4-0 nylon sutures.  Sterile compressive dressing with the thumb free was applied.  On deflation of the tourniquet, all fingers were immediately pinked.  He was taken to the recovery room for observation in satisfactory condition.  A local infiltration with 0.25% bupivacaine was given just after closure, approximately 8 mL was used.  The patient tolerated the procedure well.  He will be discharged to home to return to the [facility] in 1 week, on Norco.          ______________________________ Daryll Brod, M.D.     GK/MEDQ  D:  06/28/2016  T:  06/29/2016  Job:  [id number]

## 2016-07-06 ENCOUNTER — Encounter: Payer: Medicare Other | Admitting: Rehabilitative and Restorative Service Providers"

## 2016-07-08 DIAGNOSIS — E119 Type 2 diabetes mellitus without complications: Secondary | ICD-10-CM | POA: Diagnosis not present

## 2016-07-20 ENCOUNTER — Ambulatory Visit: Payer: Medicare Other | Attending: Geriatric Medicine | Admitting: Rehabilitative and Restorative Service Providers"

## 2016-07-20 DIAGNOSIS — R2681 Unsteadiness on feet: Secondary | ICD-10-CM | POA: Diagnosis not present

## 2016-07-20 DIAGNOSIS — R2689 Other abnormalities of gait and mobility: Secondary | ICD-10-CM

## 2016-07-20 DIAGNOSIS — R42 Dizziness and giddiness: Secondary | ICD-10-CM | POA: Diagnosis not present

## 2016-07-20 NOTE — Therapy (Signed)
[facility] [address] [place], [place], [address] Phone: [phone]   Fax:  [phone]  Physical Therapy Treatment  Patient Details  Name: Carl Strong MRN: [id number] Date of Birth: Feb 16, 1929 Referring Provider: Lajean Manes, MD  Encounter Date: 07/20/2016      PT End of Session - 07/20/16 0808    Visit Number 3   Number of Visits 5   Date for PT Re-Evaluation 07/05/16   Authorization Type BCBS MCR HMO- G code every 10th visit   PT Start Time 0800   PT Stop Time 0840   PT Time Calculation (min) 40 min   Activity Tolerance Patient tolerated treatment well   Behavior During Therapy [facility] for tasks assessed/performed      Past Medical History  Diagnosis Date  . Hypertension   . Diabetes mellitus without complication ([place])   . GERD (gastroesophageal reflux disease)   . Osteopenia   . Cancer [facility])     prostate    Past Surgical History  Procedure Laterality Date  . Cholecystectomy    . Prostate surgery    . Carpal tunnel release Right 06/28/2016    Procedure: RIGHT CARPAL TUNNEL RELEASE;  Surgeon: Daryll Brod, MD;  Location: [facility];  Service: Orthopedics;  Laterality: Right;  ANESTHESIA:  IV REGIONAL FAB    There were no vitals filed for this visit.      Subjective Assessment - 07/20/16 0807    Subjective The patient had held vestibular rehab due to recent R wrist surgery.  He reports he still has intermittent sensations of vertigo and is doing gaze adaptation exercises.    Patient Stated Goals Improve balance "this is my biggest concern."   Currently in Pain? No/denies            [facility] PT Assessment - 07/20/16 1213    High Level Balance   High Level Balance Activites Other (comment)   High Level Balance Comments single leg 3 seconds L 5 seconds R   Functional Gait  Assessment   Gait assessed  Yes   Gait Level Surface Walks 20 ft in less than 5.5 sec, no assistive  devices, good speed, no evidence for imbalance, normal gait pattern, deviates no more than 6 in outside of the 12 in walkway width.   Change in Gait Speed Able to change speed, demonstrates mild gait deviations, deviates 6-10 in outside of the 12 in walkway width, or no gait deviations, unable to achieve a major change in velocity, or uses a change in velocity, or uses an assistive device.   Gait with Horizontal Head Turns Performs head turns smoothly with slight change in gait velocity (eg, minor disruption to smooth gait path), deviates 6-10 in outside 12 in walkway width, or uses an assistive device.   Gait with Vertical Head Turns Performs head turns with no change in gait. Deviates no more than 6 in outside 12 in walkway width.   Gait and Pivot Turn Pivot turns safely within 3 sec and stops quickly with no loss of balance.   Step Over Obstacle Is able to step over one shoe box (4.5 in total height) without changing gait speed. No evidence of imbalance.   Gait with Narrow Base of Support Ambulates 4-7 steps.   Gait with Eyes Closed Walks 20 ft, slow speed, abnormal gait pattern, evidence for imbalance, deviates 10-15 in outside 12 in walkway width. Requires more than 9 sec to ambulate 20 ft.  Ambulating Backwards Walks 20 ft, uses assistive device, slower speed, mild gait deviations, deviates 6-10 in outside 12 in walkway width.   Steps Alternating feet, must use rail.   Total Score 21   FGA comment: Improved from 14/30            Vestibular Assessment - 07/20/16 0809    Vestibular Assessment   General Observation Patient walks into clinic independently without device.    Occulomotor Exam   Occulomotor Alignment --  L eye appears inferior to R eye position   Spontaneous Absent   Gaze-induced Absent   Smooth Pursuits Intact   Saccades Intact   Comment Patient c/o double vision only with gaze adaptation exercises   Vestibulo-Occular Reflex   VOR 1 Head Only (x 1 viewing) slow VOR  creates a mild sense of target blurring/movement indicating retinal slip during head motion   Positional Testing   Sidelying Test Sidelying Right;Sidelying Left   Horizontal Canal Testing Horizontal Canal Right;Horizontal Canal Left   Sidelying Right   Sidelying Right Duration 5 seconds   Sidelying Right Symptoms Upbeat, right rotatory nystagmus   Sidelying Left   Sidelying Left Duration trace subjective report of vertigo    Sidelying Left Symptoms No nystagmus   Horizontal Canal Right   Horizontal Canal Right Duration none   Horizontal Canal Right Symptoms Normal   Horizontal Canal Left   Horizontal Canal Left Duration none   Horizontal Canal Left Symptoms Normal                 [facility] Adult PT Treatment/Exercise - 07/20/16 1213    Ambulation/Gait   Ambulation/Gait Yes   Ambulation/Gait Assistance 7: Independent   Ambulation Distance (Feet) 200 Feet   Assistive device None   Gait Pattern Within Functional Limits   Gait velocity 3.29 ft/sec         Vestibular Treatment/Exercise - 07/20/16 1212    Vestibular Treatment/Exercise   Vestibular Treatment Provided Gaze;Habituation   Habituation Exercises Nestor Lewandowsky   Gaze Exercises X1 Viewing Horizontal   Nestor Lewandowsky   Number of Reps  3   Symptom Description  no vertigo after first repetitions; symptoms fatigue   Standing Horizontal Head Turns   Number of Reps  10   Symptom Description  with feet in partial heel/toe position   X1 Viewing Horizontal   Foot Position standing   Comments performed x 30 seconds with cues on keeping target in focus               PT Education - 07/20/16 0841    Education provided Yes   Education Details HEP: reviewed habituation, gaze, balance exercises in corner   Person(s) Educated Patient   Methods Explanation;Demonstration;Handout   Comprehension Verbalized understanding;Returned demonstration          PT Short Term Goals - 02/27/16 0908    PT SHORT TERM GOAL #1    Title STGs=LTGs           PT Long Term Goals - 07/20/16 8502    PT LONG TERM GOAL #1   Title The patient will be indep with HEP for habituation, balance and gaze activities.   Baseline Met on 07/20/2016   Time 4   Period Weeks   Status Achieved   PT LONG TERM GOAL #2   Title The patient will improve functional gait assessment from 14/30 to > or equal to 20/30 to demo decreasing risk for falls.   Baseline Patient now scores 21/30.  Time 4   Period Weeks   Status Achieved   PT LONG TERM GOAL #3   Title The patient will improve gait speed from 2.53 ft/sec to > or equal to 3.0 ft/sec to demo improving functional mobiliy.   Baseline Patient improved gait speed to 3.29 ft/sec   Time 4   Period Weeks   Status Achieved   PT LONG TERM GOAL #4   Title The patient will maintain single limb stance for 10 seconds on each leg for improved balance control.    Baseline Patient maintains single limb stance x 3 seconds/5 seconds.   Time 4   Period Weeks   Status Not Met   PT LONG TERM GOAL #5   Title Reduce DHI from 32% to < or equal to 18% to demo dec'd self perception of dizziness.   Baseline *did not retest due to the patient being on hold with wrist surgery.   Time 4   Period Weeks   Status Deferred               Plan - 07/20/16 1209    Clinical Impression Statement The patient has not been seen in >1 month due to wrist surgery.  He continues with a trace amount of R BPPV noted through <5 seconds of upbeating, rotary nystagmus that resolves with repetition.  PT recommended he continue brandt daroff habituation, gaze exercises and high level balance activiites.  He subjectively feels significant improvement since 2nd PT visit and has returned to gym routine and has increased confidence with balance.  PT recommended he call within next month if vertigo returns.  If no f/u indicated, PT will d/c in next month.    Rehab Potential Good   PT Treatment/Interventions ADLs/Self Care Home  Management;Canalith Repostioning;Gait training;Stair training;Functional mobility training;Therapeutic activities;Therapeutic exercise;Balance training;Neuromuscular re-education;Patient/family education;Manual techniques;Vestibular   PT Next Visit Plan Patient to call only if indicated (vertigo returns).  No f/u currently scheduled, on hold to ensure symptoms remain resolved.   Consulted and Agree with Plan of Care Patient      Patient will benefit from skilled therapeutic intervention in order to improve the following deficits and impairments:  Abnormal gait, Decreased balance, Dizziness  Visit Diagnosis: Dizziness and giddiness  Unsteadiness  Other abnormalities of gait and mobility     Problem List There are no active problems to display for this patient.   [place], PT 07/20/2016, 12:14 PM  [facility] [address] [place], [place], [address] Phone: [phone]   Fax:  [phone]  Name: Carl Strong MRN: [id number] Date of Birth: 08-22-1929

## 2016-07-20 NOTE — Patient Instructions (Signed)
Tip Card 1.The goal of habituation training is to assist in decreasing symptoms of vertigo, dizziness, or nausea provoked by specific head and body motions. 2.These exercises may initially increase symptoms; however, be persistent and work through symptoms. With repetition and time, the exercises will assist in reducing or eliminating symptoms. 3.Exercises should be stopped and discussed with the therapist if you experience any of the following: - Sudden change or fluctuation in hearing - New onset of ringing in the ears, or increase in current intensity - Any fluid discharge from the ear - Severe pain in neck or back - Extreme nausea  Copyright  VHI. All rights reserved.  Sit to Side-Lying   Sit on edge of bed. Lie down onto the right side and hold until dizziness stops, plus 20 seconds.  Return to sitting and wait until dizziness stops, plus 20 seconds.  Repeat to the left side. Repeat sequence 5 times per session. Do 2 sessions per day.  Copyright  VHI. All rights reserved.  Gaze Stabilization: Tip Card 1.Target must remain in focus, not blurry, and appear stationary while head is in motion. 2.Perform exercises with small head movements (45 to either side of midline). 3.Increase speed of head motion so long as target is in focus. 4.If you wear eyeglasses, be sure you can see target through lens (therapist will give specific instructions for bifocal / progressive lenses). 5.These exercises may provoke dizziness or nausea. Work through these symptoms. If too dizzy, slow head movement slightly. Rest between each exercise. 6.Exercises demand concentration; avoid distractions. 7.For safety, perform standing exercises close to a counter, wall, corner, or next to someone.  Copyright  VHI. All rights reserved.  Gaze Stabilization: Standing Feet Apart   Feet shoulder width apart, keeping eyes on target on wall 3 feet away, tilt head down slightly and move head side to side for 30 seconds.  Repeat while moving head up and down for 30 seconds. Do 2 sessions per day.   Copyright  VHI. All rights reserved.   Feet Together (Compliant Surface) Varied Arm Positions - Eyes Closed    Stand on compliant surface: _pillow with feet together and arms out. Close eyes and visualize upright position. Hold_30___ seconds. Repeat __3__ times per session. Do __1-2__ sessions per day.  Copyright  VHI. All rights reserved.  Feet Partial Heel-Toe, Head Motion - Eyes Open    With eyes open, right foot partially in front of the other, move head slowly: up and down x 10 times. Switch feet and move head side to side x 10 times. Do _1-2___ sessions per day.  Copyright  VHI. All rights reserved.    [place], PT6/16/2017 8:42 AM [facility] [address] [place], [place], [address] Phone: [phone] Fax: [phone]

## 2016-07-27 ENCOUNTER — Encounter: Payer: Medicare Other | Admitting: Rehabilitative and Restorative Service Providers"

## 2016-08-29 DIAGNOSIS — E119 Type 2 diabetes mellitus without complications: Secondary | ICD-10-CM | POA: Diagnosis not present

## 2016-09-27 DIAGNOSIS — H2513 Age-related nuclear cataract, bilateral: Secondary | ICD-10-CM | POA: Diagnosis not present

## 2016-09-27 DIAGNOSIS — H40013 Open angle with borderline findings, low risk, bilateral: Secondary | ICD-10-CM | POA: Diagnosis not present

## 2016-09-27 DIAGNOSIS — E119 Type 2 diabetes mellitus without complications: Secondary | ICD-10-CM | POA: Diagnosis not present

## 2016-10-10 DIAGNOSIS — Z23 Encounter for immunization: Secondary | ICD-10-CM | POA: Diagnosis not present

## 2016-10-16 DIAGNOSIS — E119 Type 2 diabetes mellitus without complications: Secondary | ICD-10-CM | POA: Diagnosis not present

## 2016-11-30 DIAGNOSIS — E119 Type 2 diabetes mellitus without complications: Secondary | ICD-10-CM | POA: Diagnosis not present

## 2016-12-07 ENCOUNTER — Encounter: Payer: Self-pay | Admitting: Rehabilitative and Restorative Service Providers"

## 2016-12-07 NOTE — Therapy (Signed)
[facility] [address] [place], [place], [address] Phone: [phone]   Fax:  [phone]  Patient Details  Name: Carl Strong MRN: [id number] Date of Birth: [birth year] Referring Provider:  No ref. provider found  Encounter Date: last encounter 07/20/2016  PHYSICAL THERAPY DISCHARGE SUMMARY  Visits from Start of Care: 3  Current functional level related to goals / functional outcomes:     PT Long Term Goals - 07/20/16 5110      PT LONG TERM GOAL #1   Title The patient will be indep with HEP for habituation, balance and gaze activities.   Baseline Met on 07/20/2016   Time 4   Period Weeks   Status Achieved     PT LONG TERM GOAL #2   Title The patient will improve functional gait assessment from 14/30 to > or equal to 20/30 to demo decreasing risk for falls.   Baseline Patient now scores 21/30.   Time 4   Period Weeks   Status Achieved     PT LONG TERM GOAL #3   Title The patient will improve gait speed from 2.53 ft/sec to > or equal to 3.0 ft/sec to demo improving functional mobiliy.   Baseline Patient improved gait speed to 3.29 ft/sec   Time 4   Period Weeks   Status Achieved     PT LONG TERM GOAL #4   Title The patient will maintain single limb stance for 10 seconds on each leg for improved balance control.    Baseline Patient maintains single limb stance x 3 seconds/5 seconds.   Time 4   Period Weeks   Status Not Met     PT LONG TERM GOAL #5   Title Reduce DHI from 32% to < or equal to 18% to demo dec'd self perception of dizziness.   Baseline *did not retest due to the patient being on hold with wrist surgery.   Time 4   Period Weeks   Status Deferred        Remaining deficits: Mild BPPV   Education / Equipment: HEP   Plan: Patient agrees to discharge.  Patient goals were partially met. Patient is being discharged due to meeting the stated rehab goals.  ?????                  Thank you for the referral of this patient. Rudell Cobb, MPT    [place] , [place] 12/07/2016, 12:49 PM  [facility] [address] [place], [place], [address] Phone: [phone]   Fax:  [phone]

## 2017-01-06 ENCOUNTER — Observation Stay (HOSPITAL_COMMUNITY)
Admission: EM | Admit: 2017-01-06 | Discharge: 2017-01-08 | Disposition: A | Discharge status: Home / Self Care | Payer: Medicare Other | Attending: Internal Medicine | Admitting: Internal Medicine

## 2017-01-06 ENCOUNTER — Emergency Department (HOSPITAL_COMMUNITY): Payer: Medicare Other

## 2017-01-06 ENCOUNTER — Encounter (HOSPITAL_COMMUNITY): Payer: Self-pay | Admitting: Emergency Medicine

## 2017-01-06 DIAGNOSIS — Z7983 Long term (current) use of bisphosphonates: Secondary | ICD-10-CM | POA: Diagnosis not present

## 2017-01-06 DIAGNOSIS — Z7982 Long term (current) use of aspirin: Secondary | ICD-10-CM | POA: Insufficient documentation

## 2017-01-06 DIAGNOSIS — E119 Type 2 diabetes mellitus without complications: Secondary | ICD-10-CM

## 2017-01-06 DIAGNOSIS — Z79899 Other long term (current) drug therapy: Secondary | ICD-10-CM | POA: Diagnosis not present

## 2017-01-06 DIAGNOSIS — E11649 Type 2 diabetes mellitus with hypoglycemia without coma: Principal | ICD-10-CM | POA: Insufficient documentation

## 2017-01-06 DIAGNOSIS — N390 Urinary tract infection, site not specified: Secondary | ICD-10-CM | POA: Insufficient documentation

## 2017-01-06 DIAGNOSIS — Z7984 Long term (current) use of oral hypoglycemic drugs: Secondary | ICD-10-CM | POA: Diagnosis not present

## 2017-01-06 DIAGNOSIS — Z8546 Personal history of malignant neoplasm of prostate: Secondary | ICD-10-CM | POA: Insufficient documentation

## 2017-01-06 DIAGNOSIS — Z87891 Personal history of nicotine dependence: Secondary | ICD-10-CM | POA: Diagnosis not present

## 2017-01-06 DIAGNOSIS — E162 Hypoglycemia, unspecified: Secondary | ICD-10-CM | POA: Diagnosis present

## 2017-01-06 DIAGNOSIS — I10 Essential (primary) hypertension: Secondary | ICD-10-CM

## 2017-01-06 DIAGNOSIS — G9341 Metabolic encephalopathy: Secondary | ICD-10-CM | POA: Diagnosis not present

## 2017-01-06 DIAGNOSIS — R059 Cough, unspecified: Secondary | ICD-10-CM

## 2017-01-06 DIAGNOSIS — R05 Cough: Secondary | ICD-10-CM

## 2017-01-06 DIAGNOSIS — G9340 Encephalopathy, unspecified: Secondary | ICD-10-CM | POA: Diagnosis present

## 2017-01-06 DIAGNOSIS — B9689 Other specified bacterial agents as the cause of diseases classified elsewhere: Secondary | ICD-10-CM | POA: Diagnosis not present

## 2017-01-06 DIAGNOSIS — K219 Gastro-esophageal reflux disease without esophagitis: Secondary | ICD-10-CM

## 2017-01-06 DIAGNOSIS — R531 Weakness: Secondary | ICD-10-CM | POA: Diagnosis not present

## 2017-01-06 DIAGNOSIS — R634 Abnormal weight loss: Secondary | ICD-10-CM

## 2017-01-06 DIAGNOSIS — M8580 Other specified disorders of bone density and structure, unspecified site: Secondary | ICD-10-CM | POA: Diagnosis not present

## 2017-01-06 HISTORY — DX: Type 2 diabetes mellitus without complications: E11.9

## 2017-01-06 HISTORY — DX: Pure hypercholesterolemia, unspecified: E78.00

## 2017-01-06 HISTORY — DX: Unspecified osteoarthritis, unspecified site: M19.90

## 2017-01-06 HISTORY — DX: Personal history of urinary calculi: Z87.442

## 2017-01-06 HISTORY — DX: Malignant neoplasm of prostate: C61

## 2017-01-06 LAB — CBC
HCT: 42.1 % (ref 39.0–52.0)
HEMOGLOBIN: 14.6 g/dL (ref 13.0–17.0)
MCH: 30.2 pg (ref 26.0–34.0)
MCHC: 34.7 g/dL (ref 30.0–36.0)
MCV: 87.2 fL (ref 78.0–100.0)
Platelets: 221 10*3/uL (ref 150–400)
RBC: 4.83 MIL/uL (ref 4.22–5.81)
RDW: 13.6 % (ref 11.5–15.5)
WBC: 10 10*3/uL (ref 4.0–10.5)

## 2017-01-06 LAB — COMPREHENSIVE METABOLIC PANEL
ALBUMIN: 3 g/dL — AB (ref 3.5–5.0)
ALK PHOS: 49 U/L (ref 38–126)
ALT: 22 U/L (ref 17–63)
AST: 31 U/L (ref 15–41)
Anion gap: 9 (ref 5–15)
BILIRUBIN TOTAL: 0.3 mg/dL (ref 0.3–1.2)
BUN: 27 mg/dL — ABNORMAL HIGH (ref 6–20)
CALCIUM: 8.6 mg/dL — AB (ref 8.9–10.3)
CO2: 26 mmol/L (ref 22–32)
Chloride: 102 mmol/L (ref 101–111)
Creatinine, Ser: 1.15 mg/dL (ref 0.61–1.24)
GFR calc Af Amer: >60 mL/min (ref >60)
GFR calc non Af Amer: 56 mL/min — ABNORMAL LOW (ref >60)
GLUCOSE: 78 mg/dL (ref 65–99)
Potassium: 3.5 mmol/L (ref 3.5–5.1)
Sodium: 137 mmol/L (ref 135–145)
TOTAL PROTEIN: 6.9 g/dL (ref 6.5–8.1)

## 2017-01-06 LAB — CBC WITH DIFFERENTIAL/PLATELET
BASOS PCT: 0 %
Basophils Absolute: 0 10*3/uL (ref 0.0–0.1)
Eosinophils Absolute: 0 10*3/uL (ref 0.0–0.7)
Eosinophils Relative: 0 %
HEMATOCRIT: 39.3 % (ref 39.0–52.0)
HEMOGLOBIN: 13.5 g/dL (ref 13.0–17.0)
Lymphocytes Relative: 10 %
Lymphs Abs: 0.9 10*3/uL (ref 0.7–4.0)
MCH: 30 pg (ref 26.0–34.0)
MCHC: 34.4 g/dL (ref 30.0–36.0)
MCV: 87.3 fL (ref 78.0–100.0)
MONOS PCT: 7 %
Monocytes Absolute: 0.6 10*3/uL (ref 0.1–1.0)
NEUTROS ABS: 7.6 10*3/uL (ref 1.7–7.7)
NEUTROS PCT: 83 %
Platelets: 210 10*3/uL (ref 150–400)
RBC: 4.5 MIL/uL (ref 4.22–5.81)
RDW: 13.5 % (ref 11.5–15.5)
WBC: 9.1 10*3/uL (ref 4.0–10.5)

## 2017-01-06 LAB — URINALYSIS, ROUTINE W REFLEX MICROSCOPIC
BILIRUBIN URINE: NEGATIVE
Glucose, UA: NEGATIVE mg/dL
KETONES UR: NEGATIVE mg/dL
Leukocytes, UA: NEGATIVE
Nitrite: NEGATIVE
PH: 5 (ref 5.0–8.0)
Protein, ur: 30 mg/dL — AB
Specific Gravity, Urine: 1.019 (ref 1.005–1.030)

## 2017-01-06 LAB — GLUCOSE, CAPILLARY
Glucose-Capillary: 67 mg/dL (ref 65–99)
Glucose-Capillary: 71 mg/dL (ref 65–99)
Glucose-Capillary: 94 mg/dL (ref 65–99)

## 2017-01-06 LAB — CREATININE, SERUM
Creatinine, Ser: 1.1 mg/dL (ref 0.61–1.24)
GFR, EST NON AFRICAN AMERICAN: 59 mL/min — AB (ref >60)

## 2017-01-06 LAB — CBG MONITORING, ED
GLUCOSE-CAPILLARY: 59 mg/dL — AB (ref 65–99)
GLUCOSE-CAPILLARY: 78 mg/dL (ref 65–99)
Glucose-Capillary: 111 mg/dL — ABNORMAL HIGH (ref 65–99)
Glucose-Capillary: 55 mg/dL — ABNORMAL LOW (ref 65–99)
Glucose-Capillary: 54 mg/dL — ABNORMAL LOW (ref 65–99)
Glucose-Capillary: 138 mg/dL — ABNORMAL HIGH (ref 65–99)

## 2017-01-06 LAB — I-STAT TROPONIN, ED: Troponin i, poc: 0.01 ng/mL (ref 0.00–0.08)

## 2017-01-06 MED ORDER — HYDROCHLOROTHIAZIDE 25 MG PO TABS
25.0000 mg | ORAL_TABLET | Freq: Every day | ORAL | Status: DC
  Administered 2017-01-06 – 2017-01-07 (×2): 25 mg via ORAL
  Filled 2017-01-06 (×3): qty 1

## 2017-01-06 MED ORDER — DEXTROSE 5 % IV SOLN
1.0000 g | INTRAVENOUS | Status: DC
  Administered 2017-01-06: 1 g via INTRAVENOUS
  Filled 2017-01-06: qty 10

## 2017-01-06 MED ORDER — AMLODIPINE BESYLATE 10 MG PO TABS
10.0000 mg | ORAL_TABLET | Freq: Every day | ORAL | Status: DC
  Administered 2017-01-06 – 2017-01-07 (×2): 10 mg via ORAL
  Filled 2017-01-06 (×2): qty 1
  Filled 2017-01-06: qty 2

## 2017-01-06 MED ORDER — SODIUM CHLORIDE 0.9% FLUSH
3.0000 mL | Freq: Two times a day (BID) | INTRAVENOUS | Status: DC
  Administered 2017-01-07 – 2017-01-08 (×3): 3 mL via INTRAVENOUS

## 2017-01-06 MED ORDER — ONDANSETRON HCL 4 MG/2ML IJ SOLN
4.0000 mg | Freq: Four times a day (QID) | INTRAMUSCULAR | Status: DC | PRN
Start: 2017-01-06 — End: 2017-01-08

## 2017-01-06 MED ORDER — ASPIRIN 81 MG PO CHEW
81.0000 mg | CHEWABLE_TABLET | Freq: Every day | ORAL | Status: DC
  Administered 2017-01-06 – 2017-01-08 (×3): 81 mg via ORAL
  Filled 2017-01-06 (×3): qty 1

## 2017-01-06 MED ORDER — DEXTROSE 50 % IV SOLN: 1.0000 | Freq: Once | INTRAVENOUS | Status: DC | PRN

## 2017-01-06 MED ORDER — LISINOPRIL 20 MG PO TABS
20.0000 mg | ORAL_TABLET | Freq: Two times a day (BID) | ORAL | Status: DC
  Administered 2017-01-07 (×2): 20 mg via ORAL
  Filled 2017-01-06 (×3): qty 1

## 2017-01-06 MED ORDER — POLYETHYLENE GLYCOL 3350 17 G PO PACK: 17.0000 g | PACK | Freq: Every day | ORAL | Status: DC | PRN

## 2017-01-06 MED ORDER — ATENOLOL 50 MG PO TABS
25.0000 mg | ORAL_TABLET | Freq: Every day | ORAL | Status: DC
  Administered 2017-01-06 – 2017-01-07 (×2): 25 mg via ORAL
  Filled 2017-01-06 (×3): qty 1

## 2017-01-06 MED ORDER — PRAVASTATIN SODIUM 10 MG PO TABS
10.0000 mg | ORAL_TABLET | Freq: Every day | ORAL | Status: DC
  Administered 2017-01-06 – 2017-01-08 (×3): 10 mg via ORAL
  Filled 2017-01-06 (×3): qty 1

## 2017-01-06 MED ORDER — FAMOTIDINE 40 MG/5ML PO SUSR: 40.0000 mg | Freq: Every day | ORAL | Status: DC | PRN

## 2017-01-06 MED ORDER — INSULIN ASPART 100 UNIT/ML ~~LOC~~ SOLN: 0.0000 [IU] | SUBCUTANEOUS | Status: DC

## 2017-01-06 MED ORDER — LORAZEPAM 2 MG/ML IJ SOLN
1.0000 mg | Freq: Once | INTRAMUSCULAR | Status: AC
  Administered 2017-01-06: 1 mg via INTRAVENOUS
  Filled 2017-01-06: qty 1

## 2017-01-06 MED ORDER — ACETAMINOPHEN 325 MG PO TABS
650.0000 mg | ORAL_TABLET | Freq: Four times a day (QID) | ORAL | Status: DC | PRN
  Filled 2017-01-06: qty 2

## 2017-01-06 MED ORDER — ENOXAPARIN SODIUM 40 MG/0.4ML ~~LOC~~ SOLN
40.0000 mg | SUBCUTANEOUS | Status: DC
  Administered 2017-01-06 – 2017-01-07 (×2): 40 mg via SUBCUTANEOUS
  Filled 2017-01-06 (×2): qty 0.4

## 2017-01-06 MED ORDER — KCL IN DEXTROSE-NACL 20-5-0.45 MEQ/L-%-% IV SOLN
INTRAVENOUS | Status: DC
  Administered 2017-01-06 – 2017-01-07 (×2): via INTRAVENOUS
  Filled 2017-01-06 (×3): qty 1000

## 2017-01-06 MED ORDER — ASPIRIN 81 MG PO TABS: 81.0000 mg | ORAL_TABLET | Freq: Every day | ORAL | Status: DC

## 2017-01-06 MED ORDER — SPIRONOLACTONE 25 MG PO TABS
25.0000 mg | ORAL_TABLET | Freq: Every day | ORAL | Status: DC
  Administered 2017-01-06 – 2017-01-08 (×3): 25 mg via ORAL
  Filled 2017-01-06 (×3): qty 1

## 2017-01-06 MED ORDER — ONDANSETRON HCL 4 MG PO TABS: 4.0000 mg | ORAL_TABLET | Freq: Four times a day (QID) | ORAL | Status: DC | PRN

## 2017-01-06 MED ORDER — ACETAMINOPHEN 650 MG RE SUPP: 650.0000 mg | Freq: Four times a day (QID) | RECTAL | Status: DC | PRN

## 2017-01-06 NOTE — Progress Notes (Signed)
Pharmacy Antibiotic Note  Carl Strong is a 84 y.o. male admitted on 01/06/2017 with UTI.  Pharmacy has been consulted for ceftriaxone dosing. Afeb, WBC wnl.  Plan: Ceftriaxone 1g IV q24h F/u c/s, clinical progress, LOT     Temp (24hrs), Avg:98.3 F (36.8 C), Min:98.3 F (36.8 C), Max:98.3 F (36.8 C)   Recent Labs Lab 01/06/17 1410 01/06/17 1706  WBC 9.1 10.0  CREATININE 1.15 1.10    CrCl cannot be calculated (Unknown ideal weight.).    No Known Allergies  Elicia Lamp, PharmD, BCPS Clinical Pharmacist 01/06/2017 6:16 PM

## 2017-01-06 NOTE — ED Provider Notes (Signed)
Carl Strong DEPT Provider Note   CSN: [id number] Arrival date & time: 01/06/17  1126     History   Chief Complaint Chief Complaint  Patient presents with  . Weakness    HPI Carl Strong is a 83 y.o. male.  HPI Carl Strong is a 83 y.o. male with history of diabetes, hypertension, prostate cancer, presents to emergency department complaining of generalized weakness and slurred speech. Patient apparently woke up this morning and noticed that he was feeling weak and has had trouble speaking. He called his friend over who came over and called 911. The dispatcher asked his friend to check for facial droop and weakness and difficulty with ambulating. His friend states that he had no facial droop, he was able to move all extremities equally, however his movements and speech was slow. When EMS got to the scene his blood sugar was 52. He was given [facility] and a chocolate candy bar. He states gradually his symptoms improved over the time he came to the ED, he felt better. He states however he is not back to normal self yet. States he has not eaten yet this morning. He denies any other complaints.  Past Medical History:  Diagnosis Date  . Cancer [facility])    prostate  . Diabetes mellitus without complication ([place])   . GERD (gastroesophageal reflux disease)   . Hypertension   . Osteopenia     There are no active problems to display for this patient.   Past Surgical History:  Procedure Laterality Date  . CARPAL TUNNEL RELEASE Right 06/28/2016   Procedure: RIGHT CARPAL TUNNEL RELEASE;  Surgeon: Daryll Brod, MD;  Location: [facility];  Service: Orthopedics;  Laterality: Right;  ANESTHESIA:  IV REGIONAL FAB  . CHOLECYSTECTOMY    . PROSTATE SURGERY         Home Medications    Prior to Admission medications   Medication Sig Start Date End Date Taking? Authorizing Provider  alendronate (FOSAMAX) 70 MG tablet Take with a full glass of water on an empty stomach.     Historical Provider, MD  amLODipine-benazepril (LOTREL) 10-20 MG capsule Take 1 capsule by mouth daily.    Historical Provider, MD  aspirin 81 MG tablet Take 81 mg by mouth daily.    Historical Provider, MD  atenolol (TENORMIN) 25 MG tablet Take by mouth daily.    Historical Provider, MD  co-enzyme Q-10 30 MG capsule Take by mouth 3 (three) times daily.    Historical Provider, MD  fosinopril (MONOPRIL) 20 MG tablet Take 20 mg by mouth daily.    Historical Provider, MD  glipiZIDE (GLUCOTROL) 10 MG tablet Take 10 mg by mouth daily before breakfast.    Historical Provider, MD  HYDROcodone-acetaminophen (NORCO) 5-325 MG tablet Take 1 tablet by mouth every 6 (six) hours as needed for moderate pain. 06/28/16   Daryll Brod, MD  metFORMIN (GLUCOPHAGE) 1000 MG tablet Take 1,000 mg by mouth 2 (two) times daily with a meal.    Historical Provider, MD  Multiple Vitamins-Minerals (MULTIVITAMIN WITH MINERALS) tablet Take 1 tablet by mouth daily.    Historical Provider, MD  pravastatin (PRAVACHOL) 10 MG tablet Take 10 mg by mouth daily.    Historical Provider, MD  spironolactone (ALDACTONE) 25 MG tablet Take 25 mg by mouth daily.    Historical Provider, MD    Family History No family history on file.  Social History Social History  Substance Use Topics  . Smoking status: Former Research scientist (life sciences)  .  Smokeless tobacco: Not on file  . Alcohol use No     Allergies   Patient has no known allergies.   Review of Systems Review of Systems  Constitutional: Negative for chills and fever.  Respiratory: Negative for cough, chest tightness and shortness of breath.   Cardiovascular: Negative for chest pain, palpitations and leg swelling.  Gastrointestinal: Negative for abdominal distention, abdominal pain, diarrhea, nausea and vomiting.  Genitourinary: Negative for dysuria, frequency, hematuria and urgency.  Musculoskeletal: Negative for arthralgias, myalgias, neck pain and neck stiffness.  Skin: Negative for rash.    Allergic/Immunologic: Negative for immunocompromised state.  Neurological: Positive for speech difficulty and weakness. Negative for dizziness, light-headedness, numbness and headaches.  All other systems reviewed and are negative.    Physical Exam Updated Vital Signs BP 142/55 (BP Location: Right Arm)   Pulse (!) 56   Temp 98.3 F (36.8 C) (Oral)   Resp 22   SpO2 100%   Physical Exam  Constitutional: He is oriented to person, place, and time. He appears well-developed and well-nourished. No distress.  HENT:  Head: Normocephalic and atraumatic.  Eyes: Conjunctivae are normal.  Neck: Neck supple.  Cardiovascular: Normal rate, regular rhythm and normal heart sounds.   Pulmonary/Chest: Effort normal. No respiratory distress. He has no wheezes. He has no rales.  Abdominal: Soft. Bowel sounds are normal. He exhibits no distension. There is no tenderness. There is no rebound.  Musculoskeletal: He exhibits no edema.  Neurological: He is alert and oriented to person, place, and time. No cranial nerve deficit. He exhibits normal muscle tone. Coordination normal.  5/5 and equal upper and lower extremity strength bilaterally. Equal grip strength bilaterally. Normal finger to nose and heel to shin. No pronator drift. Patellar reflexes 2+   Skin: Skin is warm and dry.  Nursing note and vitals reviewed.    ED Treatments / Results  Labs (all labs ordered are listed, but only abnormal results are displayed) Labs Reviewed  CBG MONITORING, ED - Abnormal; Notable for the following:       Result Value   Glucose-Capillary 111 (*)    All other components within normal limits  CBG MONITORING, ED - Abnormal; Notable for the following:    Glucose-Capillary 55 (*)    All other components within normal limits  CBG MONITORING, ED - Abnormal; Notable for the following:    Glucose-Capillary 59 (*)    All other components within normal limits  CBC WITH DIFFERENTIAL/PLATELET  COMPREHENSIVE  METABOLIC PANEL  URINALYSIS, ROUTINE W REFLEX MICROSCOPIC  I-STAT TROPOININ, ED    EKG  EKG Interpretation None       Radiology Dg Chest 2 View  Result Date: 01/06/2017 CLINICAL DATA:  Weakness and slurred speech today. EXAM: CHEST  2 VIEW COMPARISON:  PA and lateral chest 01/05/2016 and 11/23/2009. FINDINGS: Lungs are clear. Heart size is normal. No pneumothorax or pleural effusion. Aortic atherosclerosis. No acute bony abnormality. IMPRESSION: No acute disease. Atherosclerosis. Electronically Signed   By: Inge Rise M.D.   On: 01/06/2017 12:53   Mr Brain Wo Contrast  Result Date: 01/06/2017 CLINICAL DATA:  Weakness and slurred speech.  Hypoglycemia. EXAM: MRI HEAD WITHOUT CONTRAST TECHNIQUE: Multiplanar, multiecho pulse sequences of the brain and surrounding structures were obtained without intravenous contrast. COMPARISON:  01/28/2010 brain MRI FINDINGS: Motion degraded study which could obscure pathology. Brain: No acute infarction, hemorrhage, hydrocephalus, extra-axial collection or mass lesion. Microvascular ischemic gliosis in the cerebral white matter, mild for age. Remote hemorrhagic insult in  the left parietal white matter. No evidence of generalized chronic microhemorrhage. Age congruent generalized volume loss that has progressed since 2011. Vascular: Preserved flow voids. Skull and upper cervical spine: No acute finding. 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 Sinuses/Orbits: Negative IMPRESSION: 1. Motion degraded exam without acute finding. 2. Chronic microvascular disease that is mild and essentially stable from 2011. Electronically Signed   By: Monte Fantasia M.D.   On: 01/06/2017 14:06    Procedures Procedures (including critical care time)  Medications Ordered in ED Medications  LORazepam (ATIVAN) injection 1 mg (1 mg Intravenous Given 01/06/17 1305)     Initial Impression / Assessment and Plan / ED Course  I have reviewed the  triage vital signs and the nursing notes.  Pertinent labs & imaging results that were available during my care of the patient were reviewed by me and considered in my medical decision making (see chart for details).  Clinical Course   Patient emergency department with generalized weakness onset this morning. Last seen normal yesterday. Patient's symptoms have improved after he was given some [facility] and a candy bar after his sugar was found to 52. He states he feels better but he does not feel back to normal. His blood sugar here is 111. Will check labs, we will get EKG, and get MRI brain to rule out CVA.   3:25 PM Patient did require Ativan to proceed with MRI, 1 mg given IV. Patient is slightly sedated from Ativan, otherwise states he feels much better. His MRI is negative. Blood work pending. His blood sugar was rechecked and is 59. He was given a sandwich and some orange juice.   4:31 PM Blood work with no significant findings. PT feeling better. Glucose recheck 78. Discussed with Dr. Zenia Resides who has seen pt. Pt will need admission for persistent hypoglycemia vs tia for observation.  Discussed with hospitalist, will admit.   Vitals:   01/06/17 1139 01/06/17 1500  BP: (!) 139/50 142/55  Pulse: 69 (!) 56  Resp: 20 22  Temp: 98.3 F (36.8 C)   TempSrc: Oral   SpO2: 95% 100%      Final Clinical Impressions(s) / ED Diagnoses   Final diagnoses:  Weakness  Hypoglycemia    New Prescriptions New Prescriptions   No medications on file     Jeannett Senior, PA-C 01/06/17 1633

## 2017-01-06 NOTE — Progress Notes (Signed)
Writer checked blood sugar and it was 67. Writer gave pt a container of orange juice and apple juice and he drank both.

## 2017-01-06 NOTE — ED Triage Notes (Addendum)
Per [facility] patient from home for slurred speech.  Patient has history of diabetes, CBG on scene 63.  Slurred speech resolved after eating, EMS reports CBG of 84 immediately prior to arrival at [facility].  Patient states he started feeling weak this morning.  Patient denies having any complaints at this time.  Patient is alert and oriented and in no apparent distress.

## 2017-01-06 NOTE — H&P (Signed)
[facility] History and Physical  Carl Strong [id number] DOB: [birth year] DOA: 01/06/2017  Referring physician:  PCP: Mathews Argyle, MD   Chief Complaint: "I was weak."  HPI: Carl Strong is a 83 y.o. male  significant for prostate cancer, diabetes, reflux, hypertension who presented to the emergency room with chief complaint of weakness. Patient states over the last few days he has been well. Patient has had no sick contacts. Patient denies any fever, chills, nausea, vomiting, headache or any other symptoms prior to this morning. Patient states this morning when he awoke he was weak and had trouble walking. He was shaky. Overall he just did not feel right. His speech also did not calm out fluently so he activated EMS. When EMS evaluated him they saw that his blood sugar was 52. Patient was given candy and his blood sugar rebounded. Patient was taken to the emergency room for evaluation. Patient states that for the last 3 days he's been taken off his diabetes medications as directed but has had decreased appetite and has only been eating breakfast in no other meals. Patient states lack of appetite is the only reason for this is not abdominal pain or nausea.  ED course: MRI and chest x-ray negative. UA positive for urinary tract infection on microscopy.   Review of Systems:  As per HPI otherwise 10 point review of systems negative.    Past Medical History:  Diagnosis Date  . Cancer [facility])    prostate  . Diabetes mellitus without complication ([place])   . GERD (gastroesophageal reflux disease)   . Hypertension   . Osteopenia    Past Surgical History:  Procedure Laterality Date  . CARPAL TUNNEL RELEASE Right 06/28/2016   Procedure: RIGHT CARPAL TUNNEL RELEASE;  Surgeon: Daryll Brod, MD;  Location: [facility];  Service: Orthopedics;  Laterality: Right;  ANESTHESIA:  IV REGIONAL FAB  . CHOLECYSTECTOMY    . PROSTATE SURGERY     Social History:  reports  that he has quit smoking. He does not have any smokeless tobacco history on file. He reports that he does not drink alcohol or use drugs.  No Known Allergies  No family history on file.   Prior to Admission medications   Medication Sig Start Date End Date Taking? Authorizing Provider  amLODipine (NORVASC) 10 MG tablet Take 10 mg by mouth daily.   Yes Historical Provider, MD  aspirin 81 MG tablet Take 81 mg by mouth daily.   Yes Historical Provider, MD  atenolol (TENORMIN) 25 MG tablet Take 25 mg by mouth daily.    Yes Historical Provider, MD  glipiZIDE (GLUCOTROL XL) 10 MG 24 hr tablet Take 10 mg by mouth 2 (two) times daily.   Yes Historical Provider, MD  hydrochlorothiazide (HYDRODIURIL) 25 MG tablet Take 25 mg by mouth daily.   Yes Historical Provider, MD  lisinopril (PRINIVIL,ZESTRIL) 20 MG tablet Take 20 mg by mouth 2 (two) times daily.   Yes Historical Provider, MD  metFORMIN (GLUCOPHAGE) 1000 MG tablet Take 1,000 mg by mouth 2 (two) times daily with a meal.   Yes Historical Provider, MD  pravastatin (PRAVACHOL) 10 MG tablet Take 10 mg by mouth daily.   Yes Historical Provider, MD  spironolactone (ALDACTONE) 25 MG tablet Take 25 mg by mouth daily.   Yes Historical Provider, MD   Physical Exam: Vitals:   01/06/17 1139 01/06/17 1500 01/06/17 1645 01/06/17 1730  BP: (!) 139/50 142/55 148/66 120/90  Pulse: 69 (!) 56 65  64  Resp: 20 22 20 20   Temp: 98.3 F (36.8 C)     TempSrc: Oral     SpO2: 95% 100% 95% 95%    Wt Readings from Last 3 Encounters:  06/28/16 91.2 kg (201 lb)    General:  Appears calm and comfortable. Alert and oriented 3 Eyes:  PERRL, EOMI, normal lids, iris ENT:  grossly normal hearing, lips & tongue Neck:  no LAD, masses or thyromegaly Cardiovascular:  RRR, no m/r/g. No LE edema.  Respiratory:  CTA bilaterally, no w/r/r. Normal respiratory effort. Abdomen:  soft, ntnd Skin:  no rash or induration seen on limited exam Musculoskeletal:  grossly normal tone  BUE/BLE Psychiatric:  grossly normal mood and affect, speech fluent and appropriate Neurologic:  CN 2-12 grossly intact, moves all extremities in coordinated fashion.No pronator drift.            Labs on Admission:  Basic Metabolic Panel:  Recent Labs Lab 01/06/17 1410 01/06/17 1706  NA 137  --   K 3.5  --   CL 102  --   CO2 26  --   GLUCOSE 78  --   BUN 27*  --   CREATININE 1.15 1.10  CALCIUM 8.6*  --    Liver Function Tests:  Recent Labs Lab 01/06/17 1410  AST 31  ALT 22  ALKPHOS 49  BILITOT 0.3  PROT 6.9  ALBUMIN 3.0*   No results for input(s): LIPASE, AMYLASE in the last 168 hours. No results for input(s): AMMONIA in the last 168 hours. CBC:  Recent Labs Lab 01/06/17 1410 01/06/17 1706  WBC 9.1 10.0  NEUTROABS 7.6  --   HGB 13.5 14.6  HCT 39.3 42.1  MCV 87.3 87.2  PLT 210 221   Cardiac Enzymes: No results for input(s): CKTOTAL, CKMB, CKMBINDEX, TROPONINI in the last 168 hours.  BNP (last 3 results) No results for input(s): BNP in the last 8760 hours.  ProBNP (last 3 results) No results for input(s): PROBNP in the last 8760 hours.   Creatinine clearance cannot be calculated (Unknown ideal weight.)  CBG:  Recent Labs Lab 01/06/17 1446 01/06/17 1513 01/06/17 1541 01/06/17 1651 01/06/17 1740  GLUCAP 55* 59* 78 54* 138*    Radiological Exams on Admission: Dg Chest 2 View  Result Date: 01/06/2017 CLINICAL DATA:  Weakness and slurred speech today. EXAM: CHEST  2 VIEW COMPARISON:  PA and lateral chest 01/05/2016 and 11/23/2009. FINDINGS: Lungs are clear. Heart size is normal. No pneumothorax or pleural effusion. Aortic atherosclerosis. No acute bony abnormality. IMPRESSION: No acute disease. Atherosclerosis. Electronically Signed   By: Inge Rise M.D.   On: 01/06/2017 12:53   Mr Brain Wo Contrast  Result Date: 01/06/2017 CLINICAL DATA:  Weakness and slurred speech.  Hypoglycemia. EXAM: MRI HEAD WITHOUT CONTRAST TECHNIQUE: Multiplanar,  multiecho pulse sequences of the brain and surrounding structures were obtained without intravenous contrast. COMPARISON:  01/28/2010 brain MRI FINDINGS: Motion degraded study which could obscure pathology. Brain: No acute infarction, hemorrhage, hydrocephalus, extra-axial collection or mass lesion. Microvascular ischemic gliosis in the cerebral white matter, mild for age. Remote hemorrhagic insult in the left parietal white matter. No evidence of generalized chronic microhemorrhage. Age congruent generalized volume loss that has progressed since 2011. Vascular: Preserved flow voids. Skull and upper cervical spine: No acute finding. 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 Sinuses/Orbits: Negative IMPRESSION: 1. Motion degraded exam without acute finding. 2. Chronic microvascular disease that is mild and essentially stable from 2011.  Electronically Signed   By: Monte Fantasia M.D.   On: 01/06/2017 14:06    EKG: Independently reviewed. Ventricular rate 72, PR interval 180, QRS 90, QTC 437; sinus rhythm, no STEMI-no acute changes when compared to EKG from June 2017  Assessment/Plan Principal Problem:   Acute encephalopathy Active Problems:   Hypertension   GERD (gastroesophageal reflux disease)   Diabetes mellitus without complication (HCC)   Hypoglycemia  Acute encephalopathy MRI negative Likely due to hypoglycemia from low food intake and continued use of diabetic medications with the decreased appetite caused by urinary tract infection D5 half-normal saline with 20 of K started We'll monitor blood sugars Sliding scale insulin later Liberalize diet When necessary amp D50  DM SSI DM edu ordered  UTI Urine culture sent Rocephin ordered  Hypertension When necessary hydralazine 10 mg IV as needed for severe blood pressure Continue home medication  GERD When necessary Pepcid  Code Status: FULL  DVT Prophylaxis: lovenox Family Communication: family  at bedside Disposition Plan: Pending Improvement  Status: OBS, tele  Elwin Mocha, MD Family Medicine [facility] [email or web address] Password [email or web address]

## 2017-01-06 NOTE — Progress Notes (Signed)
Called pharmacy and Rocephin is compatible with D5 0.45 with 20 meq of Potassium. Writer talked to [place].

## 2017-01-06 NOTE — ED Provider Notes (Signed)
Medical screening examination/treatment/procedure(s) were conducted as a shared visit with non-physician practitioner(s) and myself.  I personally evaluated the patient during the encounter.   EKG Interpretation  Date/Time:  Sunday January 06 2017 11:33:54 EST Ventricular Rate:  72 PR Interval:    QRS Duration: 90 QT Interval:  399 QTC Calculation: 437 R Axis:   75 Text Interpretation:  Sinus rhythm Atrial premature complex Confirmed by Yazlynn Birkeland  MD, Braidon Chermak ([id number]) on 01/06/2017 3:30:53 PM     Patient here with intermittent weakness and slurred speech. EMS called and patient finally hyperglycemic. He does have a history of type 2 diabetes and does take glipizide. Blood sugar was 55 and he was given a meal to eat. Symptoms improved. During his stay in the ED he was again has some increased weakness and was given more food here and his symptoms did improve as well. Will be admitted for hypoglycemia and possible TIA   Lacretia Leigh, MD 01/06/17 [id number]

## 2017-01-06 NOTE — ED Notes (Signed)
Clark RN made aware of cbg at 16:49

## 2017-01-06 NOTE — ED Notes (Signed)
Patient given orange juice for hypoglycemia.

## 2017-01-07 ENCOUNTER — Observation Stay (HOSPITAL_COMMUNITY): Payer: Medicare Other

## 2017-01-07 DIAGNOSIS — C61 Malignant neoplasm of prostate: Secondary | ICD-10-CM | POA: Diagnosis not present

## 2017-01-07 DIAGNOSIS — E119 Type 2 diabetes mellitus without complications: Secondary | ICD-10-CM

## 2017-01-07 DIAGNOSIS — E162 Hypoglycemia, unspecified: Secondary | ICD-10-CM

## 2017-01-07 DIAGNOSIS — I10 Essential (primary) hypertension: Secondary | ICD-10-CM | POA: Diagnosis not present

## 2017-01-07 DIAGNOSIS — G9340 Encephalopathy, unspecified: Secondary | ICD-10-CM | POA: Diagnosis not present

## 2017-01-07 LAB — CBC
HEMATOCRIT: 38 % — AB (ref 39.0–52.0)
HEMOGLOBIN: 12.8 g/dL — AB (ref 13.0–17.0)
MCH: 29.3 pg (ref 26.0–34.0)
MCHC: 33.7 g/dL (ref 30.0–36.0)
MCV: 87 fL (ref 78.0–100.0)
Platelets: 201 10*3/uL (ref 150–400)
RBC: 4.37 MIL/uL (ref 4.22–5.81)
RDW: 13.5 % (ref 11.5–15.5)
WBC: 8.1 10*3/uL (ref 4.0–10.5)

## 2017-01-07 LAB — GLUCOSE, CAPILLARY
GLUCOSE-CAPILLARY: 142 mg/dL — AB (ref 65–99)
GLUCOSE-CAPILLARY: 194 mg/dL — AB (ref 65–99)
GLUCOSE-CAPILLARY: 185 mg/dL — AB (ref 65–99)
Glucose-Capillary: 123 mg/dL — ABNORMAL HIGH (ref 65–99)
Glucose-Capillary: 128 mg/dL — ABNORMAL HIGH (ref 65–99)
Glucose-Capillary: 143 mg/dL — ABNORMAL HIGH (ref 65–99)
Glucose-Capillary: 156 mg/dL — ABNORMAL HIGH (ref 65–99)
Glucose-Capillary: 187 mg/dL — ABNORMAL HIGH (ref 65–99)
Glucose-Capillary: 66 mg/dL (ref 65–99)
Glucose-Capillary: 98 mg/dL (ref 65–99)

## 2017-01-07 LAB — BASIC METABOLIC PANEL
ANION GAP: 8 (ref 5–15)
BUN: 20 mg/dL (ref 6–20)
CHLORIDE: 98 mmol/L — AB (ref 101–111)
CO2: 28 mmol/L (ref 22–32)
Calcium: 8.1 mg/dL — ABNORMAL LOW (ref 8.9–10.3)
Creatinine, Ser: 1.09 mg/dL (ref 0.61–1.24)
GFR calc non Af Amer: 60 mL/min — ABNORMAL LOW (ref >60)
Glucose, Bld: 148 mg/dL — ABNORMAL HIGH (ref 65–99)
POTASSIUM: 4.4 mmol/L (ref 3.5–5.1)
Sodium: 134 mmol/L — ABNORMAL LOW (ref 135–145)

## 2017-01-07 LAB — HEMOGLOBIN A1C
Hgb A1c MFr Bld: 6 % — ABNORMAL HIGH (ref 4.8–5.6)
Mean Plasma Glucose: 126 mg/dL

## 2017-01-07 MED ORDER — INSULIN ASPART 100 UNIT/ML ~~LOC~~ SOLN
0.0000 [IU] | Freq: Three times a day (TID) | SUBCUTANEOUS | Status: DC
  Administered 2017-01-07 – 2017-01-08 (×2): 2 [IU] via SUBCUTANEOUS
  Administered 2017-01-08: 3 [IU] via SUBCUTANEOUS
  Administered 2017-01-08: 7 [IU] via SUBCUTANEOUS

## 2017-01-07 MED ORDER — IOPAMIDOL (ISOVUE-300) INJECTION 61%
INTRAVENOUS | Status: AC
  Administered 2017-01-07: 100 mL
  Filled 2017-01-07: qty 100

## 2017-01-07 MED ORDER — GLUCERNA SHAKE PO LIQD
237.0000 mL | Freq: Two times a day (BID) | ORAL | Status: DC
  Administered 2017-01-08: 237 mL via ORAL

## 2017-01-07 MED ORDER — INSULIN ASPART 100 UNIT/ML ~~LOC~~ SOLN: 0.0000 [IU] | Freq: Every day | SUBCUTANEOUS | Status: DC

## 2017-01-07 MED ORDER — IOPAMIDOL (ISOVUE-300) INJECTION 61%
15.0000 mL | INTRAVENOUS | Status: AC
  Administered 2017-01-07 (×2): 15 mL via ORAL

## 2017-01-07 NOTE — Progress Notes (Signed)
Pt's blood sugar is 71. Writer gave pt 1 container of orange juice. Pt is aware that his blood sugar will be checked every 2 hours.

## 2017-01-07 NOTE — Care Management Obs Status (Signed)
[facility] NOTIFICATION   Patient Details  Name: Carl Strong MRN: [id number] Date of Birth: 17-Aug-1929   Medicare Observation Status Notification Given:  Yes    Carles Collet, RN 01/07/2017, 2:03 PM

## 2017-01-07 NOTE — Progress Notes (Signed)
PROGRESS NOTE    Carl Strong  [id number] DOB: Sep 29, 1929 DOA: 01/06/2017 PCP: Carl Argyle, MD   Outpatient Specialists:    Brief Narrative:  Carl Strong is a 83 y.o. male  significant for prostate cancer, diabetes, reflux, hypertension who presented to the emergency room with chief complaint of weakness. Patient states over the last few days he has been well. Patient has had no sick contacts. Patient denies any fever, chills, nausea, vomiting, headache or any other symptoms prior to this morning. Patient states this morning when he awoke he was weak and had trouble walking. He was shaky. Overall he just did not feel right. His speech also did not calm out fluently so he activated EMS. When EMS evaluated him they saw that his blood sugar was 52. Patient was given candy and his blood sugar rebounded. Patient was taken to the emergency room for evaluation. Patient states that for the last 3 days he's been taken off his diabetes medications as directed but has had decreased appetite and has only been eating breakfast in no other meals. Patient states lack of appetite is the only reason for this is not abdominal pain or nausea  + weight loss +cough  Assessment & Plan:   Principal Problem:   Acute encephalopathy Active Problems:   Hypertension   GERD (gastroesophageal reflux disease)   Diabetes mellitus without complication (HCC)   Hypoglycemia   Acute encephalopathy due to hypoglycemia MRI negative Likely due to hypoglycemia from low food intake and continued use of diabetic medications with the decreased appetite caused by urinary tract infection Resolved  Weight loss -h/o prostate cancer -CT abd/pelvis/chest  DM SSI Plan to stop glucotrol upon d/c  Do not think patient has UTI -d/c abx  Hypertension When necessary hydralazine 10 mg IV as needed for severe blood pressure Continue home medication  GERD When necessary Pepcid   DVT prophylaxis:    Lovenox   Code Status: Full Code   Family Communication: patient  Disposition Plan:         Subjective: Says he is back to baseline  Objective: Vitals:   01/06/17 1931 01/06/17 2211 01/07/17 0403 01/07/17 0935  BP: (!) 136/50 (!) 104/46 (!) 136/48 128/66  Pulse: (!) 59 (!) 53 64 77  Resp: 18 20 20    Temp: 98.8 F (37.1 C) 100.1 F (37.8 C) 98.4 F (36.9 C)   TempSrc: Oral Oral Oral   SpO2: 95% 93% 92%   Weight:   85.3 kg (188 lb)     Intake/Output Summary (Last 24 hours) at 01/07/17 1149 Last data filed at 01/07/17 0944  Gross per 24 hour  Intake             1490 ml  Output              625 ml  Net              865 ml   Filed Weights   01/07/17 0403  Weight: 85.3 kg (188 lb)    Examination:  General exam: Appears calm and comfortable  Respiratory system: diminished. Respiratory effort normal. Cardiovascular system: S1 & S2 heard, RRR. No JVD, murmurs, rubs, gallops or clicks. No pedal edema. Gastrointestinal system: Abdomen is nondistended, soft and nontender. No organomegaly or masses felt. Normal bowel sounds heard.     Data Reviewed: I have personally reviewed following labs and imaging studies  CBC:  Recent Labs Lab 01/06/17 1410 01/06/17 1706 01/07/17 0831  WBC 9.1 10.0  8.1  NEUTROABS 7.6  --   --   HGB 13.5 14.6 12.8*  HCT 39.3 42.1 38.0*  MCV 87.3 87.2 87.0  PLT 210 221 [id number]   Basic Metabolic Panel:  Recent Labs Lab 01/06/17 1410 01/06/17 1706 01/07/17 0831  NA 137  --  134*  K 3.5  --  4.4  CL 102  --  98*  CO2 26  --  28  GLUCOSE 78  --  148*  BUN 27*  --  20  CREATININE 1.15 1.10 1.09  CALCIUM 8.6*  --  8.1*   GFR: CrCl cannot be calculated (Unknown ideal weight.). Liver Function Tests:  Recent Labs Lab 01/06/17 1410  AST 31  ALT 22  ALKPHOS 49  BILITOT 0.3  PROT 6.9  ALBUMIN 3.0*   No results for input(s): LIPASE, AMYLASE in the last 168 hours. No results for input(s): AMMONIA in the last 168  hours. Coagulation Profile: No results for input(s): INR, PROTIME in the last 168 hours. Cardiac Enzymes: No results for input(s): CKTOTAL, CKMB, CKMBINDEX, TROPONINI in the last 168 hours. BNP (last 3 results) No results for input(s): PROBNP in the last 8760 hours. HbA1C:  Recent Labs  01/06/17 1706  HGBA1C 6.0*   CBG:  Recent Labs Lab 01/07/17 0158 01/07/17 0410 01/07/17 0559 01/07/17 0807 01/07/17 1120  GLUCAP 123* 98 142* 143* 194*   Lipid Profile: No results for input(s): CHOL, HDL, LDLCALC, TRIG, CHOLHDL, LDLDIRECT in the last 72 hours. Thyroid Function Tests: No results for input(s): TSH, T4TOTAL, FREET4, T3FREE, THYROIDAB in the last 72 hours. Anemia Panel: No results for input(s): VITAMINB12, FOLATE, FERRITIN, TIBC, IRON, RETICCTPCT in the last 72 hours. Urine analysis:    Component Value Date/Time   COLORURINE YELLOW 01/06/2017 1445   APPEARANCEUR HAZY (A) 01/06/2017 1445   LABSPEC 1.019 01/06/2017 1445   PHURINE 5.0 01/06/2017 1445   GLUCOSEU NEGATIVE 01/06/2017 1445   HGBUR SMALL (A) 01/06/2017 1445   BILIRUBINUR NEGATIVE 01/06/2017 1445   KETONESUR NEGATIVE 01/06/2017 1445   PROTEINUR 30 (A) 01/06/2017 1445   UROBILINOGEN 0.2 11/23/2009 1133   NITRITE NEGATIVE 01/06/2017 1445   LEUKOCYTESUR NEGATIVE 01/06/2017 1445   Sepsis Labs:   )No results found for this or any previous visit (from the past 240 hour(s)).    Anti-infectives    Start     Dose/Rate Route Frequency Ordered Stop   01/06/17 1930  cefTRIAXone (ROCEPHIN) 1 g in dextrose 5 % 50 mL IVPB  Status:  Discontinued     1 g 100 mL/hr over 30 Minutes Intravenous Every 24 hours 01/06/17 1812 01/07/17 0933       Radiology Studies: Dg Chest 2 View  Result Date: 01/06/2017 CLINICAL DATA:  Weakness and slurred speech today. EXAM: CHEST  2 VIEW COMPARISON:  PA and lateral chest 01/05/2016 and 11/23/2009. FINDINGS: Lungs are clear. Heart size is normal. No pneumothorax or pleural effusion.  Aortic atherosclerosis. No acute bony abnormality. IMPRESSION: No acute disease. Atherosclerosis. Electronically Signed   By: Inge Rise M.D.   On: 01/06/2017 12:53   Mr Brain Wo Contrast  Result Date: 01/06/2017 CLINICAL DATA:  Weakness and slurred speech.  Hypoglycemia. EXAM: MRI HEAD WITHOUT CONTRAST TECHNIQUE: Multiplanar, multiecho pulse sequences of the brain and surrounding structures were obtained without intravenous contrast. COMPARISON:  01/28/2010 brain MRI FINDINGS: Motion degraded study which could obscure pathology. Brain: No acute infarction, hemorrhage, hydrocephalus, extra-axial collection or mass lesion. Microvascular ischemic gliosis in the cerebral white matter, mild for age.  Remote hemorrhagic insult in the left parietal white matter. No evidence of generalized chronic microhemorrhage. Age congruent generalized volume loss that has progressed since 2011. Vascular: Preserved flow voids. Skull and upper cervical spine: No acute finding. 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 Sinuses/Orbits: Negative IMPRESSION: 1. Motion degraded exam without acute finding. 2. Chronic microvascular disease that is mild and essentially stable from 2011. Electronically Signed   By: Monte Fantasia M.D.   On: 01/06/2017 14:06        Scheduled Meds: . amLODipine  10 mg Oral Daily  . aspirin  81 mg Oral Daily  . atenolol  25 mg Oral Daily  . enoxaparin (LOVENOX) injection  40 mg Subcutaneous Q24H  . hydrochlorothiazide  25 mg Oral Daily  . insulin aspart  0-9 Units Subcutaneous Q4H  . lisinopril  20 mg Oral BID  . pravastatin  10 mg Oral Daily  . sodium chloride flush  3 mL Intravenous Q12H  . spironolactone  25 mg Oral Daily   Continuous Infusions:   LOS: 0 days    Time spent: 25 min    [place], DO [facility] Pager [phone]  If 7PM-7AM, please contact night-coverage [email or web address] Password [email or web address] 01/07/2017, 11:49 AM

## 2017-01-07 NOTE — Progress Notes (Signed)
Inpatient Diabetes Program Recommendations  AACE/ADA: New Consensus Statement on Inpatient Glycemic Control (2015)  Target Ranges:  Prepandial:   less than 140 mg/dL      Peak postprandial:   less than 180 mg/dL (1-2 hours)      Critically ill patients:  140 - 180 mg/dL   Lab Results  Component Value Date   GLUCAP 194 (H) 01/07/2017   HGBA1C 6.0 (H) 01/06/2017    Review of Glycemic Control:  Results for TORIE, DONICA (MRN [id number]) as of 01/07/2017 11:29  Ref. Range 01/06/2017 18:35 01/06/2017 20:43 01/06/2017 23:13 01/06/2017 23:59 01/07/2017 01:58 01/07/2017 04:10 01/07/2017 05:59 01/07/2017 08:07 01/07/2017 11:20  Glucose-Capillary Latest Ref Range: 65 - 99 mg/dL 66 94 67 71 123 (H) 98 142 (H) 143 (H) 194 (H)    Diabetes history: Type 2 diabetes Outpatient Diabetes medications: Glucotrol XL 10 mg bid, Metformin 1000 mg bid Current orders for Inpatient glycemic control:  Novolog sensitive q 4 hours  Inpatient Diabetes Program Recommendations:    Note low blood sugars on admit.  A1C is 6%.  Consider d/c of Glucotrol due to history of lows and low A1C at discharge.    Thanks, Adah Perl, RN, BC-ADM Inpatient Diabetes Coordinator Pager [phone] (8a-5p)

## 2017-01-07 NOTE — Progress Notes (Signed)
Initial Nutrition Assessment  DOCUMENTATION CODES:   Not applicable  INTERVENTION:   -Glucerna Shake po BID, each supplement provides 220 kcal and 10 grams of protein  NUTRITION DIAGNOSIS:   Inadequate oral intake related to poor appetite as evidenced by per patient/family report (hypoglycemia).  GOAL:   Patient will meet greater than or equal to 90% of their needs  MONITOR:   PO intake, Supplement acceptance, Labs, Weight trends, Skin, I & O's  REASON FOR ASSESSMENT:   Consult Assessment of nutrition requirement/status  ASSESSMENT:   Carl Strong is a 83 y.o. male  significant for prostate cancer, diabetes, reflux, hypertension who presented to the emergency room with chief complaint of weakness. Patient states over the last few days he has been well. Patient has had no sick contacts. Patient denies any fever, chills, nausea, vomiting, headache or any other symptoms prior to this morning. Patient states this morning when he awoke he was weak and had trouble walking. He was shaky. Overall he just did not feel right. His speech also did not calm out fluently so he activated EMS. When EMS evaluated him they saw that his blood sugar was 52. Patient was given candy and his blood sugar rebounded. Patient was taken to the emergency room for evaluation. Patient states that for the last 3 days he's been taken off his diabetes medications as directed but has had decreased appetite and has only been eating breakfast in no other meals. Patient states lack of appetite is the only reason for this is not abdominal pain or nausea.  Pt admitted with acute encephalopathy and hypoglycemia.   Pt sleeping soundly at time of visit and RD unable to arouse. No family available to provide further hx.   Per H&P, pt was taken off DM medications 3 days ago, but experienced poor appetite and consumed only one meal per day (breakfast). Noted good glycemic control PTA (last Hgb A1c: 6.0).   Noted pt has  experienced a modest 6.4% wt loss over the past 6 months. Suspect this may be related to lifestyle changes to assist with optimal glycemic control. Pt did not appear to have signs of fat or muscle wasting.   RD will add supplements in light of poor oral intake.   Labs reviewed: Na: 134, CBGS: 128-194.   Diet Order:  Diet heart healthy/carb modified Room service appropriate? Yes; Fluid consistency: Thin  Skin:  Reviewed, no issues  Last BM:  01/08/16  Height:   Ht Readings from Last 1 Encounters:  06/28/16 5\' 11"  (1.803 m)    Weight:   Wt Readings from Last 1 Encounters:  01/07/17 188 lb (85.3 kg)    Ideal Body Weight:  78.2 kg  BMI:  Body mass index is 26.22 kg/m.  Estimated Nutritional Needs:   Kcal:  1700-1900  Protein:  85-100 grams  Fluid:  1.7-1.9 L  EDUCATION NEEDS:   No education needs identified at this time  Nikky Duba A. Jimmye Norman, RD, LDN, CDE Pager: [phone] After hours Pager: [phone]

## 2017-01-07 NOTE — Progress Notes (Signed)
Spoke briefly with patient regarding hypoglycemia and Glucotrol. He states that he was taking glucotrol prior to admit. Explained that this could cause low blood sugars especially with decreased intake. Also explained that MD may d/c this medication and that d/c summary would specifically tell him what to take and not take once he is home.   Patient states he wants to go home.  Discussed with RN and went back to tell patient that he would not be going home today due to more testing.   Thanks, Adah Perl, RN, BC-ADM Inpatient Diabetes Coordinator Pager [phone] (8a-5p)

## 2017-01-08 ENCOUNTER — Encounter (HOSPITAL_COMMUNITY): Payer: Self-pay | Admitting: General Practice

## 2017-01-08 DIAGNOSIS — M8580 Other specified disorders of bone density and structure, unspecified site: Secondary | ICD-10-CM | POA: Diagnosis not present

## 2017-01-08 DIAGNOSIS — N390 Urinary tract infection, site not specified: Secondary | ICD-10-CM | POA: Diagnosis not present

## 2017-01-08 DIAGNOSIS — I10 Essential (primary) hypertension: Secondary | ICD-10-CM | POA: Diagnosis not present

## 2017-01-08 DIAGNOSIS — G9341 Metabolic encephalopathy: Secondary | ICD-10-CM | POA: Diagnosis not present

## 2017-01-08 DIAGNOSIS — E162 Hypoglycemia, unspecified: Secondary | ICD-10-CM | POA: Diagnosis not present

## 2017-01-08 DIAGNOSIS — E119 Type 2 diabetes mellitus without complications: Secondary | ICD-10-CM | POA: Diagnosis not present

## 2017-01-08 DIAGNOSIS — E11649 Type 2 diabetes mellitus with hypoglycemia without coma: Secondary | ICD-10-CM | POA: Diagnosis not present

## 2017-01-08 DIAGNOSIS — G9340 Encephalopathy, unspecified: Secondary | ICD-10-CM | POA: Diagnosis not present

## 2017-01-08 DIAGNOSIS — Z7982 Long term (current) use of aspirin: Secondary | ICD-10-CM | POA: Diagnosis not present

## 2017-01-08 DIAGNOSIS — Z7984 Long term (current) use of oral hypoglycemic drugs: Secondary | ICD-10-CM | POA: Diagnosis not present

## 2017-01-08 DIAGNOSIS — R634 Abnormal weight loss: Secondary | ICD-10-CM | POA: Diagnosis not present

## 2017-01-08 DIAGNOSIS — Z79899 Other long term (current) drug therapy: Secondary | ICD-10-CM | POA: Diagnosis not present

## 2017-01-08 DIAGNOSIS — Z7983 Long term (current) use of bisphosphonates: Secondary | ICD-10-CM | POA: Diagnosis not present

## 2017-01-08 LAB — GLUCOSE, CAPILLARY
GLUCOSE-CAPILLARY: 191 mg/dL — AB (ref 65–99)
GLUCOSE-CAPILLARY: 318 mg/dL — AB (ref 65–99)
GLUCOSE-CAPILLARY: 213 mg/dL — AB (ref 65–99)

## 2017-01-08 MED ORDER — GLUCERNA SHAKE PO LIQD: 237.0000 mL | Freq: Two times a day (BID) | ORAL | 0 refills | Status: DC

## 2017-01-08 MED ORDER — LEVOFLOXACIN 750 MG PO TABS
750.0000 mg | ORAL_TABLET | ORAL | Status: DC
  Administered 2017-01-08: 750 mg via ORAL
  Filled 2017-01-08: qty 1

## 2017-01-08 MED ORDER — ENSURE ENLIVE PO LIQD: 237.0000 mL | Freq: Two times a day (BID) | ORAL | Status: DC

## 2017-01-08 MED ORDER — ACETAMINOPHEN 325 MG PO TABS: 650.0000 mg | ORAL_TABLET | Freq: Four times a day (QID) | ORAL | Status: DC | PRN

## 2017-01-08 MED ORDER — LEVOFLOXACIN 750 MG PO TABS: 750.0000 mg | ORAL_TABLET | ORAL | 0 refills | Status: DC

## 2017-01-08 NOTE — Discharge Summary (Signed)
Physician Discharge Summary  Carl Strong [id number] DOB: January 11, 1929 DOA: 01/06/2017  PCP: Mathews Argyle, MD  Admit date: 01/06/2017 Discharge date: 01/08/2017   Recommendations for Outpatient Follow-Up:   1. Follow up urine culture to final--- sensitivities pending (sent home on 7 days of levaquin- to cover URI/UTI) 2. MRI of pancreatic cyst: 2 year follow up should be considered (ideally with pre and post contrast abdominal MRI)    Discharge Diagnosis:   Principal Problem:   Acute encephalopathy Active Problems:   Hypertension   GERD (gastroesophageal reflux disease)   Diabetes mellitus without complication ([place])   Hypoglycemia   Discharge disposition:  Home.  Discharge Condition: Improved.  Diet recommendation: Low sodium, heart healthy.  Carbohydrate-modified.   Wound care: None.   History of Present Illness:   Carl Strong is a 83 y.o. male  significant for prostate cancer, diabetes, reflux, hypertension who presented to the emergency room with chief complaint of weakness. Patient states over the last few days he has been well. Patient has had no sick contacts. Patient denies any fever, chills, nausea, vomiting, headache or any other symptoms prior to this morning. Patient states this morning when he awoke he was weak and had trouble walking. He was shaky. Overall he just did not feel right. His speech also did not calm out fluently so he activated EMS. When EMS evaluated him they saw that his blood sugar was 52. Patient was given candy and his blood sugar rebounded. Patient was taken to the emergency room for evaluation. Patient states that for the last 3 days he's been taken off his diabetes medications as directed but has had decreased appetite and has only been eating breakfast in no other meals. Patient states lack of appetite is the only reason for this is not abdominal pain or nausea.  ED course: MRI and chest x-ray negative. UA positive for urinary  tract infection on microscopy.    Hospital Course by Problem:   Acute encephalopathy due to hypoglycemia MRI negative for CVA Likely due to hypoglycemia from low food intake and continued use of diabetic medications with the decreased appetite caused by urinary tract infection D/c glucotrol Resolved  Weight loss -h/o prostate cancer -CT abd/pelvis/chest no sign of metatasis  DM Plan to stop glucotrol upon d/c  UTI-enterococcus -final culture pending  Hypertension Continue home medication- with d/c of norvasc  GERD When necessary Pepcid     Medical Consultants:    None.   Discharge Exam:   Vitals:   01/08/17 0847 01/08/17 1433  BP: (!) 118/54 (!) 121/53  Pulse: (!) 56 (!) 57  Resp:  20  Temp:  98 F (36.7 C)   Vitals:   01/08/17 0545 01/08/17 0847 01/08/17 0900 01/08/17 1433  BP: (!) 106/36 (!) 118/54  (!) 121/53  Pulse: (!) 52 (!) 56  (!) 57  Resp: 18   20  Temp: 100.2 F (37.9 C)   98 F (36.7 C)  TempSrc: Oral   Oral  SpO2: 93%   96%  Weight:      Height:   5' 10.87" (1.8 m)     Gen:  NAD- feeling much better  The results of significant diagnostics from this hospitalization (including imaging, microbiology, ancillary and laboratory) are listed below for reference.     Procedures and Diagnostic Studies:   Dg Chest 2 View  Result Date: 01/06/2017 CLINICAL DATA:  Weakness and slurred speech today. EXAM: CHEST  2 VIEW COMPARISON:  PA and lateral  chest 01/05/2016 and 11/23/2009. FINDINGS: Lungs are clear. Heart size is normal. No pneumothorax or pleural effusion. Aortic atherosclerosis. No acute bony abnormality. IMPRESSION: No acute disease. Atherosclerosis. Electronically Signed   By: Inge Rise M.D.   On: 01/06/2017 12:53   Ct Chest W Contrast  Result Date: 01/07/2017 CLINICAL DATA:  Weight loss. Loss of appetite. Cough. Diabetes. Prostate cancer. EXAM: CT CHEST, ABDOMEN, AND PELVIS WITH CONTRAST TECHNIQUE: Multidetector CT imaging  of the chest, abdomen and pelvis was performed following the standard protocol during bolus administration of intravenous contrast. CONTRAST:  126mL ISOVUE-300 IOPAMIDOL (ISOVUE-300) INJECTION 61% COMPARISON:  Plain films of the chest of 01/06/2017. No prior screws at no prior CTs. FINDINGS: CT CHEST FINDINGS Cardiovascular: Bovine arch. Aortic and branch vessel atherosclerosis. Mild cardiomegaly, without pericardial effusion. Multivessel coronary artery atherosclerosis. Pulmonary artery enlargement, 3.5 cm outflow tract. No central pulmonary embolism, on this non-dedicated study. Mediastinum/Nodes: No supraclavicular adenopathy. Mild subcarinal adenopathy 1.6 cm on image 34/series 2. Right infrahilar adenopathy at 12 mm on image 41/series 2. Small hiatal hernia. Fluid level in the distal esophagus and/or herniated stomach on image 51/series 2. Lungs/Pleura: Minimal left-sided pleural thickening. Moderate centrilobular and paraseptal emphysema. Minimal motion degradation. Lower lobe predominant bronchial wall thickening. Biapical pleural-parenchymal scarring. No lobar consolidation. Musculoskeletal: No acute osseous abnormality. CT ABDOMEN PELVIS FINDINGS Hepatobiliary: Normal liver. Cholecystectomy, without biliary ductal dilatation. Pancreas: No pancreatic duct dilatation or acute pancreatitis. A cystic lesion within the dorsal pancreatic tail measures 1.4 cm on image 62/series 2 and up to 1.5 cm on coronal image 77. Spleen: Normal in size, without focal abnormality. Adrenals/Urinary Tract: Bilateral adrenal thickening, with maintenance of adreniform shape. Normal kidneys, without hydronephrosis. Normal urinary bladder. Stomach/Bowel: Gastric antral underdistention. Scattered colonic diverticula. Normal terminal ileum. Appendiceal size upper normal, including at 8 mm on coronal image 76. No surrounding inflammation. Normal small bowel. Vascular/Lymphatic: Aortic and branch vessel atherosclerosis. No abdominopelvic  adenopathy. Reproductive: Prostatectomy, without locally recurrent disease. Incompletely imaged right sided hydrocele is small. Other: No significant free fluid. Bilateral fat containing inguinal hernias. Musculoskeletal: No acute osseous abnormality. Degenerate disc disease at the lumbosacral junction. IMPRESSION: 1. No acute process or explanation for weight loss/cough. 2. Mild thoracic adenopathy, favored to be reactive. Consider follow-up chest CT at 3 months to confirms stability or resolution. 3. Small hiatal hernia. Esophageal air fluid level suggests dysmotility or gastroesophageal reflux. 4. Pulmonary artery enlargement suggests pulmonary arterial hypertension. 5.  Coronary artery atherosclerosis. Aortic atherosclerosis. 6. Cystic lesion the pancreas is most likely a pseudocyst. Indolent cystic neoplasm could look similar. Per consensus criteria, given patient age, followup at 2 years should be considered (ideally with pre and post contrast abdominal MRI). This recommendation follows ACR consensus guidelines: Management of Incidental Pancreatic Cysts: A White Paper of the ACR Incidental Findings Committee. [place] [id number]. 7. Prostatectomy without abdominopelvic adenopathy. 8. Centrilobular and paraseptal emphysema. Electronically Signed   By: Abigail Miyamoto M.D.   On: 01/07/2017 20:12   Mr Brain Wo Contrast  Result Date: 01/06/2017 CLINICAL DATA:  Weakness and slurred speech.  Hypoglycemia. EXAM: MRI HEAD WITHOUT CONTRAST TECHNIQUE: Multiplanar, multiecho pulse sequences of the brain and surrounding structures were obtained without intravenous contrast. COMPARISON:  01/28/2010 brain MRI FINDINGS: Motion degraded study which could obscure pathology. Brain: No acute infarction, hemorrhage, hydrocephalus, extra-axial collection or mass lesion. Microvascular ischemic gliosis in the cerebral white matter, mild for age. Remote hemorrhagic insult in the left parietal white matter. No evidence of  generalized chronic microhemorrhage.  Age congruent generalized volume loss that has progressed since 2011. Vascular: Preserved flow voids. Skull and upper cervical spine: No acute finding. 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 Sinuses/Orbits: Negative IMPRESSION: 1. Motion degraded exam without acute finding. 2. Chronic microvascular disease that is mild and essentially stable from 2011. Electronically Signed   By: Monte Fantasia M.D.   On: 01/06/2017 14:06   Ct Abdomen Pelvis W Contrast  Result Date: 01/07/2017 CLINICAL DATA:  Weight loss. Loss of appetite. Cough. Diabetes. Prostate cancer. EXAM: CT CHEST, ABDOMEN, AND PELVIS WITH CONTRAST TECHNIQUE: Multidetector CT imaging of the chest, abdomen and pelvis was performed following the standard protocol during bolus administration of intravenous contrast. CONTRAST:  116mL ISOVUE-300 IOPAMIDOL (ISOVUE-300) INJECTION 61% COMPARISON:  Plain films of the chest of 01/06/2017. No prior screws at no prior CTs. FINDINGS: CT CHEST FINDINGS Cardiovascular: Bovine arch. Aortic and branch vessel atherosclerosis. Mild cardiomegaly, without pericardial effusion. Multivessel coronary artery atherosclerosis. Pulmonary artery enlargement, 3.5 cm outflow tract. No central pulmonary embolism, on this non-dedicated study. Mediastinum/Nodes: No supraclavicular adenopathy. Mild subcarinal adenopathy 1.6 cm on image 34/series 2. Right infrahilar adenopathy at 12 mm on image 41/series 2. Small hiatal hernia. Fluid level in the distal esophagus and/or herniated stomach on image 51/series 2. Lungs/Pleura: Minimal left-sided pleural thickening. Moderate centrilobular and paraseptal emphysema. Minimal motion degradation. Lower lobe predominant bronchial wall thickening. Biapical pleural-parenchymal scarring. No lobar consolidation. Musculoskeletal: No acute osseous abnormality. CT ABDOMEN PELVIS FINDINGS Hepatobiliary: Normal liver. Cholecystectomy,  without biliary ductal dilatation. Pancreas: No pancreatic duct dilatation or acute pancreatitis. A cystic lesion within the dorsal pancreatic tail measures 1.4 cm on image 62/series 2 and up to 1.5 cm on coronal image 77. Spleen: Normal in size, without focal abnormality. Adrenals/Urinary Tract: Bilateral adrenal thickening, with maintenance of adreniform shape. Normal kidneys, without hydronephrosis. Normal urinary bladder. Stomach/Bowel: Gastric antral underdistention. Scattered colonic diverticula. Normal terminal ileum. Appendiceal size upper normal, including at 8 mm on coronal image 76. No surrounding inflammation. Normal small bowel. Vascular/Lymphatic: Aortic and branch vessel atherosclerosis. No abdominopelvic adenopathy. Reproductive: Prostatectomy, without locally recurrent disease. Incompletely imaged right sided hydrocele is small. Other: No significant free fluid. Bilateral fat containing inguinal hernias. Musculoskeletal: No acute osseous abnormality. Degenerate disc disease at the lumbosacral junction. IMPRESSION: 1. No acute process or explanation for weight loss/cough. 2. Mild thoracic adenopathy, favored to be reactive. Consider follow-up chest CT at 3 months to confirms stability or resolution. 3. Small hiatal hernia. Esophageal air fluid level suggests dysmotility or gastroesophageal reflux. 4. Pulmonary artery enlargement suggests pulmonary arterial hypertension. 5.  Coronary artery atherosclerosis. Aortic atherosclerosis. 6. Cystic lesion the pancreas is most likely a pseudocyst. Indolent cystic neoplasm could look similar. Per consensus criteria, given patient age, followup at 2 years should be considered (ideally with pre and post contrast abdominal MRI). This recommendation follows ACR consensus guidelines: Management of Incidental Pancreatic Cysts: A White Paper of the ACR Incidental Findings Committee. [place] [id number]. 7. Prostatectomy without abdominopelvic adenopathy.  8. Centrilobular and paraseptal emphysema. Electronically Signed   By: Abigail Miyamoto M.D.   On: 01/07/2017 20:12     Labs:   Basic Metabolic Panel:  Recent Labs Lab 01/06/17 1410 01/06/17 1706 01/07/17 0831  NA 137  --  134*  K 3.5  --  4.4  CL 102  --  98*  CO2 26  --  28  GLUCOSE 78  --  148*  BUN 27*  --  20  CREATININE 1.15  1.10 1.09  CALCIUM 8.6*  --  8.1*   GFR Estimated Creatinine Clearance: 52.6 mL/min (by C-G formula based on SCr of 1.09 mg/dL). Liver Function Tests:  Recent Labs Lab 01/06/17 1410  AST 31  ALT 22  ALKPHOS 49  BILITOT 0.3  PROT 6.9  ALBUMIN 3.0*   No results for input(s): LIPASE, AMYLASE in the last 168 hours. No results for input(s): AMMONIA in the last 168 hours. Coagulation profile No results for input(s): INR, PROTIME in the last 168 hours.  CBC:  Recent Labs Lab 01/06/17 1410 01/06/17 1706 01/07/17 0831  WBC 9.1 10.0 8.1  NEUTROABS 7.6  --   --   HGB 13.5 14.6 12.8*  HCT 39.3 42.1 38.0*  MCV 87.3 87.2 87.0  PLT 210 221 201   Cardiac Enzymes: No results for input(s): CKTOTAL, CKMB, CKMBINDEX, TROPONINI in the last 168 hours. BNP: Invalid input(s): POCBNP CBG:  Recent Labs Lab 01/07/17 1714 01/07/17 2335 01/08/17 0810 01/08/17 1205 01/08/17 1724  GLUCAP 185* 156* 191* 318* 213*   D-Dimer No results for input(s): DDIMER in the last 72 hours. Hgb A1c  Recent Labs  01/06/17 1706  HGBA1C 6.0*   Lipid Profile No results for input(s): CHOL, HDL, LDLCALC, TRIG, CHOLHDL, LDLDIRECT in the last 72 hours. Thyroid function studies No results for input(s): TSH, T4TOTAL, T3FREE, THYROIDAB in the last 72 hours.  Invalid input(s): FREET3 Anemia work up No results for input(s): VITAMINB12, FOLATE, FERRITIN, TIBC, IRON, RETICCTPCT in the last 72 hours. Microbiology Recent Results (from the past 240 hour(s))  Culture, Urine     Status: Abnormal   Collection Time: 01/06/17  2:45 PM  Result Value Ref Range Status    Specimen Description URINE, RANDOM  Final   Special Requests ADDED 2128  Final   Culture >=100,000 COLONIES/mL ENTEROBACTER SPECIES (A)  Final   Report Status 01/09/2017 FINAL  Final   Organism ID, Bacteria ENTEROBACTER SPECIES (A)  Final      Susceptibility   Enterobacter species - MIC*    CEFAZOLIN >=64 RESISTANT Resistant     CEFTRIAXONE <=1 SENSITIVE Sensitive     CIPROFLOXACIN <=0.25 SENSITIVE Sensitive     GENTAMICIN <=1 SENSITIVE Sensitive     IMIPENEM <=0.25 SENSITIVE Sensitive     NITROFURANTOIN 32 SENSITIVE Sensitive     TRIMETH/SULFA <=20 SENSITIVE Sensitive     PIP/TAZO <=4 SENSITIVE Sensitive     * >=100,000 COLONIES/mL ENTEROBACTER SPECIES     Discharge Instructions:   Discharge Instructions    Call MD for:  temperature >100.4    Complete by:  As directed    Diet - low sodium heart healthy    Complete by:  As directed    Diet Carb Modified    Complete by:  As directed    Increase activity slowly    Complete by:  As directed      Allergies as of 01/08/2017   No Known Allergies     Medication List    STOP taking these medications   amLODipine 10 MG tablet Commonly known as:  NORVASC   glipiZIDE 10 MG 24 hr tablet Commonly known as:  GLUCOTROL XL   hydrochlorothiazide 25 MG tablet Commonly known as:  HYDRODIURIL   spironolactone 25 MG tablet Commonly known as:  ALDACTONE     TAKE these medications   acetaminophen 325 MG tablet Commonly known as:  TYLENOL Take 2 tablets (650 mg total) by mouth every 6 (six) hours as needed for mild pain (  or Fever >/= 101).   aspirin 81 MG tablet Take 81 mg by mouth daily.   atenolol 25 MG tablet Commonly known as:  TENORMIN Take 25 mg by mouth daily.   feeding supplement (GLUCERNA SHAKE) Liqd Take 237 mLs by mouth 2 (two) times daily between meals.   levofloxacin 750 MG tablet Commonly known as:  LEVAQUIN Take 1 tablet (750 mg total) by mouth daily.   lisinopril 20 MG tablet Commonly known as:   PRINIVIL,ZESTRIL Take 20 mg by mouth 2 (two) times daily.   metFORMIN 1000 MG tablet Commonly known as:  GLUCOPHAGE Take 1,000 mg by mouth 2 (two) times daily with a meal.   pravastatin 10 MG tablet Commonly known as:  PRAVACHOL Take 10 mg by mouth daily.      Follow-up [place], MD Follow up in 1 week(s).   Specialty:  Internal Medicine Why:  for final urine culture results and hospital follow up Contact information: [address] [place] [address] [phone]            Time coordinating discharge: 35 min  Signed:  JESSICA Alison Stalling   [facility] 01/09/2017, 12:48 PM

## 2017-01-08 NOTE — Progress Notes (Signed)
Carl Strong to be D/C'd Home per MD order.  Discussed with the patient and all questions fully answered.  VSS, Skin clean, dry and intact without evidence of skin break down, no evidence of skin tears noted. IV catheter discontinued intact. Site without signs and symptoms of complications. Dressing and pressure applied.  An After Visit Summary was printed and given to the patient. Patient received prescription.  D/c education completed with patient/family including follow up instructions, medication list, d/c activities limitations if indicated, with other d/c instructions as indicated by MD - patient able to verbalize understanding, all questions fully answered.   Patient instructed to return to ED, call 911, or call MD for any changes in condition.   Patient escorted via [name], and D/C home via private auto.  [name] 01/08/2017 5:49 PM

## 2017-01-08 NOTE — Care Management Note (Signed)
Case Management Note  Patient Details  Name: RIDGE DUMOND MRN: [id number] Date of Birth: [birth year]  Subjective/Objective:                 Spoke to patient at bedside. Patient from home alone, supported by daughter.  No CM needs identified.   Action/Plan:  DC to home self care.  Expected Discharge Date:                  Expected Discharge Plan:  Home/Self Care  In-House Referral:     Discharge planning Services  CM Consult  Post Acute Care Choice:    Choice offered to:     DME Arranged:    DME Agency:     HH Arranged:    [place] Agency:     Status of Service:  Completed, signed off  If discussed at [employer] of Stay Meetings, dates discussed:    Additional Comments:  Carles Collet, RN 01/08/2017, 2:59 PM

## 2017-01-08 NOTE — Progress Notes (Signed)
Physical Therapy Evaluation Patient Details Name: Carl Strong MRN: [id number] DOB: 05/14/1929 Today's Date: 01/08/2017   History of Present Illness  Patient present s to the hospital with complaint of weakness. He reports he has not been able to eat for several days. He has eaten this morning and reports feeling much better. He was found in his room at his sink. PMH: Osteopenia, DMII, cancer, HTN  Clinical Impression  Patient is indepndent with mobility at this time. He reports improved strength since beginning to eat. He ambualted 300' without syncope or reports of fatigue. No skilled PT required at this time.     Follow Up Recommendations No PT follow up    Equipment Recommendations  Rolling walker with 5" wheels    Recommendations for Other Services Rehab consult     Precautions / Restrictions Precautions Precautions: None Restrictions Weight Bearing Restrictions: No      Mobility  Bed Mobility Overal bed mobility: Independent             General bed mobility comments: able to get in and back out of bed without diffciulty   Transfers Overall transfer level: Independent Equipment used: None             General transfer comment: transferd sit to stand 2x with therapy with no loss of balance   Ambulation/Gait Ambulation/Gait assistance: Independent Ambulation Distance (Feet): 300 Feet Assistive device: None       General Gait Details: sterady gait pattern. Able to make turns with no falls. No fatigue noted after ambulation   Stairs            Wheelchair Mobility    Modified Rankin (Stroke Patients Only)       Balance                                             Pertinent Vitals/Pain Pain Assessment: No/denies pain    Home Living Family/patient expects to be discharged to:: Private residence Living Arrangements: Alone   Type of Home: House           Additional Comments: 3 steps wo get into the house      Prior Function Level of Independence: Independent               Hand Dominance        Extremity/Trunk Assessment   Upper Extremity Assessment Upper Extremity Assessment: Overall WFL for tasks assessed    Lower Extremity Assessment Lower Extremity Assessment: Overall WFL for tasks assessed       Communication   Communication: No difficulties  Cognition Arousal/Alertness: Awake/alert Behavior During Therapy: WFL for tasks assessed/performed Overall Cognitive Status: Within Functional Limits for tasks assessed                      General Comments      Exercises     Assessment/Plan    PT Assessment Patent does not need any further PT services  PT Problem List            PT Treatment Interventions      PT Goals (Current goals can be found in the Care Plan section)  Acute Rehab PT Goals Patient Stated Goal: To go home PT Goal Formulation: With patient Time For Goal Achievement: 01/22/17 Potential to Achieve Goals: Good    Frequency     Barriers  to discharge        Co-evaluation               End of Session Equipment Utilized During Treatment: Gait belt          Functional Assessment Tool Used: Clinical judgement  Functional Limitation: Mobility: Walking and moving around Mobility: Walking and Moving Around Current Status [phone]): 0 percent impaired, limited or restricted Mobility: Walking and Moving Around Discharge Status [phone]): 0 percent impaired, limited or restricted    Time: 0950-1005 PT Time Calculation (min) (ACUTE ONLY): 15 min   Charges:   PT Evaluation $PT Eval Low Complexity: 1 Procedure     PT G Codes:   PT G-Codes **NOT FOR INPATIENT CLASS** Functional Assessment Tool Used: Clinical judgement  Functional Limitation: Mobility: Walking and moving around Mobility: Walking and Moving Around Current Status [id number]): 0 percent impaired, limited or restricted Mobility: Walking and Moving Around Discharge Status  [phone]): 0 percent impaired, limited or restricted    Carney Living PT DPT  01/08/2017, 10:18 AM

## 2017-01-09 DIAGNOSIS — R404 Transient alteration of awareness: Secondary | ICD-10-CM | POA: Diagnosis not present

## 2017-01-09 DIAGNOSIS — R531 Weakness: Secondary | ICD-10-CM | POA: Diagnosis not present

## 2017-01-09 LAB — URINE CULTURE: Culture: >=100000 — AB

## 2017-01-15 DIAGNOSIS — N3000 Acute cystitis without hematuria: Secondary | ICD-10-CM | POA: Diagnosis not present

## 2017-01-15 DIAGNOSIS — E1141 Type 2 diabetes mellitus with diabetic mononeuropathy: Secondary | ICD-10-CM | POA: Diagnosis not present

## 2017-01-15 DIAGNOSIS — R591 Generalized enlarged lymph nodes: Secondary | ICD-10-CM | POA: Diagnosis not present

## 2017-01-15 DIAGNOSIS — I700 Atherosclerosis of aorta: Secondary | ICD-10-CM | POA: Diagnosis not present

## 2017-01-15 DIAGNOSIS — K862 Cyst of pancreas: Secondary | ICD-10-CM | POA: Diagnosis not present

## 2017-01-15 DIAGNOSIS — I10 Essential (primary) hypertension: Secondary | ICD-10-CM | POA: Diagnosis not present

## 2017-01-30 DIAGNOSIS — R590 Localized enlarged lymph nodes: Secondary | ICD-10-CM | POA: Diagnosis not present

## 2017-01-30 DIAGNOSIS — Z0000 Encounter for general adult medical examination without abnormal findings: Secondary | ICD-10-CM | POA: Diagnosis not present

## 2017-01-30 DIAGNOSIS — I10 Essential (primary) hypertension: Secondary | ICD-10-CM | POA: Diagnosis not present

## 2017-01-30 DIAGNOSIS — E7800 Pure hypercholesterolemia, unspecified: Secondary | ICD-10-CM | POA: Diagnosis not present

## 2017-01-30 DIAGNOSIS — I700 Atherosclerosis of aorta: Secondary | ICD-10-CM | POA: Diagnosis not present

## 2017-01-30 DIAGNOSIS — Z1389 Encounter for screening for other disorder: Secondary | ICD-10-CM | POA: Diagnosis not present

## 2017-01-30 DIAGNOSIS — Z79899 Other long term (current) drug therapy: Secondary | ICD-10-CM | POA: Diagnosis not present

## 2017-01-30 DIAGNOSIS — E1142 Type 2 diabetes mellitus with diabetic polyneuropathy: Secondary | ICD-10-CM | POA: Diagnosis not present

## 2017-02-20 DIAGNOSIS — E1142 Type 2 diabetes mellitus with diabetic polyneuropathy: Secondary | ICD-10-CM | POA: Diagnosis not present

## 2017-03-13 ENCOUNTER — Other Ambulatory Visit: Payer: Self-pay | Admitting: Geriatric Medicine

## 2017-03-13 DIAGNOSIS — R599 Enlarged lymph nodes, unspecified: Secondary | ICD-10-CM

## 2017-03-13 DIAGNOSIS — E7800 Pure hypercholesterolemia, unspecified: Secondary | ICD-10-CM | POA: Diagnosis not present

## 2017-03-13 DIAGNOSIS — R591 Generalized enlarged lymph nodes: Secondary | ICD-10-CM | POA: Diagnosis not present

## 2017-03-13 DIAGNOSIS — I10 Essential (primary) hypertension: Secondary | ICD-10-CM | POA: Diagnosis not present

## 2017-03-13 DIAGNOSIS — E1142 Type 2 diabetes mellitus with diabetic polyneuropathy: Secondary | ICD-10-CM | POA: Diagnosis not present

## 2017-03-28 ENCOUNTER — Ambulatory Visit
Admission: RE | Admit: 2017-03-28 | Discharge: 2017-03-28 | Disposition: A | Discharge status: Home / Self Care | Payer: Medicare Other | Source: Ambulatory Visit | Attending: Geriatric Medicine | Admitting: Geriatric Medicine

## 2017-03-28 DIAGNOSIS — R591 Generalized enlarged lymph nodes: Secondary | ICD-10-CM

## 2017-03-28 DIAGNOSIS — R599 Enlarged lymph nodes, unspecified: Secondary | ICD-10-CM

## 2017-03-28 DIAGNOSIS — R918 Other nonspecific abnormal finding of lung field: Secondary | ICD-10-CM | POA: Diagnosis not present

## 2017-04-24 DIAGNOSIS — H5032 Intermittent alternating esotropia: Secondary | ICD-10-CM | POA: Diagnosis not present

## 2017-04-24 DIAGNOSIS — H532 Diplopia: Secondary | ICD-10-CM | POA: Diagnosis not present

## 2017-04-24 DIAGNOSIS — H2513 Age-related nuclear cataract, bilateral: Secondary | ICD-10-CM | POA: Diagnosis not present

## 2017-04-24 DIAGNOSIS — H40013 Open angle with borderline findings, low risk, bilateral: Secondary | ICD-10-CM | POA: Diagnosis not present

## 2017-05-13 DIAGNOSIS — R42 Dizziness and giddiness: Secondary | ICD-10-CM | POA: Diagnosis not present

## 2017-05-13 DIAGNOSIS — I10 Essential (primary) hypertension: Secondary | ICD-10-CM | POA: Diagnosis not present

## 2017-05-13 DIAGNOSIS — Z7984 Long term (current) use of oral hypoglycemic drugs: Secondary | ICD-10-CM | POA: Diagnosis not present

## 2017-05-13 DIAGNOSIS — E1142 Type 2 diabetes mellitus with diabetic polyneuropathy: Secondary | ICD-10-CM | POA: Diagnosis not present

## 2017-05-24 DIAGNOSIS — E1142 Type 2 diabetes mellitus with diabetic polyneuropathy: Secondary | ICD-10-CM | POA: Diagnosis not present

## 2017-06-12 DIAGNOSIS — I10 Essential (primary) hypertension: Secondary | ICD-10-CM | POA: Diagnosis not present

## 2017-06-20 DIAGNOSIS — E1142 Type 2 diabetes mellitus with diabetic polyneuropathy: Secondary | ICD-10-CM | POA: Diagnosis not present

## 2017-06-26 DIAGNOSIS — M5136 Other intervertebral disc degeneration, lumbar region: Secondary | ICD-10-CM | POA: Diagnosis not present

## 2017-06-26 DIAGNOSIS — M5116 Intervertebral disc disorders with radiculopathy, lumbar region: Secondary | ICD-10-CM | POA: Diagnosis not present

## 2017-06-27 ENCOUNTER — Other Ambulatory Visit: Payer: Self-pay | Admitting: Internal Medicine

## 2017-06-27 DIAGNOSIS — M5116 Intervertebral disc disorders with radiculopathy, lumbar region: Secondary | ICD-10-CM

## 2017-07-07 ENCOUNTER — Ambulatory Visit
Admission: RE | Admit: 2017-07-07 | Discharge: 2017-07-07 | Disposition: A | Discharge status: Home / Self Care | Payer: Medicare Other | Source: Ambulatory Visit | Attending: Internal Medicine | Admitting: Internal Medicine

## 2017-07-07 DIAGNOSIS — S32039A Unspecified fracture of third lumbar vertebra, initial encounter for closed fracture: Secondary | ICD-10-CM | POA: Diagnosis not present

## 2017-07-07 DIAGNOSIS — M5116 Intervertebral disc disorders with radiculopathy, lumbar region: Secondary | ICD-10-CM

## 2017-07-25 DIAGNOSIS — I10 Essential (primary) hypertension: Secondary | ICD-10-CM | POA: Diagnosis not present

## 2017-07-25 DIAGNOSIS — E1142 Type 2 diabetes mellitus with diabetic polyneuropathy: Secondary | ICD-10-CM | POA: Diagnosis not present

## 2017-07-25 DIAGNOSIS — S32030A Wedge compression fracture of third lumbar vertebra, initial encounter for closed fracture: Secondary | ICD-10-CM | POA: Diagnosis not present

## 2017-07-30 DIAGNOSIS — E1142 Type 2 diabetes mellitus with diabetic polyneuropathy: Secondary | ICD-10-CM | POA: Diagnosis not present

## 2017-07-30 DIAGNOSIS — C61 Malignant neoplasm of prostate: Secondary | ICD-10-CM | POA: Diagnosis not present

## 2017-07-30 DIAGNOSIS — I10 Essential (primary) hypertension: Secondary | ICD-10-CM | POA: Diagnosis not present

## 2017-08-29 DIAGNOSIS — I10 Essential (primary) hypertension: Secondary | ICD-10-CM | POA: Diagnosis not present

## 2017-08-29 DIAGNOSIS — H8110 Benign paroxysmal vertigo, unspecified ear: Secondary | ICD-10-CM | POA: Diagnosis not present

## 2017-08-29 DIAGNOSIS — M4850XA Collapsed vertebra, not elsewhere classified, site unspecified, initial encounter for fracture: Secondary | ICD-10-CM | POA: Diagnosis not present

## 2017-08-29 DIAGNOSIS — Z79899 Other long term (current) drug therapy: Secondary | ICD-10-CM | POA: Diagnosis not present

## 2017-09-03 DIAGNOSIS — I10 Essential (primary) hypertension: Secondary | ICD-10-CM | POA: Diagnosis not present

## 2017-09-03 DIAGNOSIS — C61 Malignant neoplasm of prostate: Secondary | ICD-10-CM | POA: Diagnosis not present

## 2017-09-03 DIAGNOSIS — E1142 Type 2 diabetes mellitus with diabetic polyneuropathy: Secondary | ICD-10-CM | POA: Diagnosis not present

## 2017-09-23 DIAGNOSIS — M8588 Other specified disorders of bone density and structure, other site: Secondary | ICD-10-CM | POA: Diagnosis not present

## 2017-09-23 DIAGNOSIS — S32030A Wedge compression fracture of third lumbar vertebra, initial encounter for closed fracture: Secondary | ICD-10-CM | POA: Diagnosis not present

## 2017-10-14 DIAGNOSIS — I10 Essential (primary) hypertension: Secondary | ICD-10-CM | POA: Diagnosis not present

## 2017-10-14 DIAGNOSIS — Z79899 Other long term (current) drug therapy: Secondary | ICD-10-CM | POA: Diagnosis not present

## 2017-10-14 DIAGNOSIS — M85851 Other specified disorders of bone density and structure, right thigh: Secondary | ICD-10-CM | POA: Diagnosis not present

## 2017-10-14 DIAGNOSIS — E1142 Type 2 diabetes mellitus with diabetic polyneuropathy: Secondary | ICD-10-CM | POA: Diagnosis not present

## 2017-12-13 DIAGNOSIS — E1142 Type 2 diabetes mellitus with diabetic polyneuropathy: Secondary | ICD-10-CM | POA: Diagnosis not present

## 2018-02-05 DIAGNOSIS — E7800 Pure hypercholesterolemia, unspecified: Secondary | ICD-10-CM | POA: Diagnosis not present

## 2018-02-05 DIAGNOSIS — I700 Atherosclerosis of aorta: Secondary | ICD-10-CM | POA: Diagnosis not present

## 2018-02-05 DIAGNOSIS — Z0000 Encounter for general adult medical examination without abnormal findings: Secondary | ICD-10-CM | POA: Diagnosis not present

## 2018-02-05 DIAGNOSIS — I10 Essential (primary) hypertension: Secondary | ICD-10-CM | POA: Diagnosis not present

## 2018-02-25 DIAGNOSIS — E1142 Type 2 diabetes mellitus with diabetic polyneuropathy: Secondary | ICD-10-CM | POA: Diagnosis not present

## 2018-02-25 DIAGNOSIS — Z7984 Long term (current) use of oral hypoglycemic drugs: Secondary | ICD-10-CM | POA: Diagnosis not present

## 2018-02-25 DIAGNOSIS — Z8546 Personal history of malignant neoplasm of prostate: Secondary | ICD-10-CM | POA: Diagnosis not present

## 2018-02-25 DIAGNOSIS — I10 Essential (primary) hypertension: Secondary | ICD-10-CM | POA: Diagnosis not present

## 2018-02-27 DIAGNOSIS — E1142 Type 2 diabetes mellitus with diabetic polyneuropathy: Secondary | ICD-10-CM | POA: Diagnosis not present

## 2018-02-27 DIAGNOSIS — Z79899 Other long term (current) drug therapy: Secondary | ICD-10-CM | POA: Diagnosis not present

## 2018-02-27 DIAGNOSIS — Z7984 Long term (current) use of oral hypoglycemic drugs: Secondary | ICD-10-CM | POA: Diagnosis not present

## 2018-02-27 DIAGNOSIS — I10 Essential (primary) hypertension: Secondary | ICD-10-CM | POA: Diagnosis not present

## 2018-03-03 DIAGNOSIS — Z79899 Other long term (current) drug therapy: Secondary | ICD-10-CM | POA: Diagnosis not present

## 2018-04-24 DIAGNOSIS — H02831 Dermatochalasis of right upper eyelid: Secondary | ICD-10-CM | POA: Diagnosis not present

## 2018-04-24 DIAGNOSIS — H5034 Intermittent alternating exotropia: Secondary | ICD-10-CM | POA: Diagnosis not present

## 2018-04-24 DIAGNOSIS — H2513 Age-related nuclear cataract, bilateral: Secondary | ICD-10-CM | POA: Diagnosis not present

## 2018-04-24 DIAGNOSIS — H532 Diplopia: Secondary | ICD-10-CM | POA: Diagnosis not present

## 2018-06-02 DIAGNOSIS — I700 Atherosclerosis of aorta: Secondary | ICD-10-CM | POA: Diagnosis not present

## 2018-06-02 DIAGNOSIS — I10 Essential (primary) hypertension: Secondary | ICD-10-CM | POA: Diagnosis not present

## 2018-06-02 DIAGNOSIS — E1142 Type 2 diabetes mellitus with diabetic polyneuropathy: Secondary | ICD-10-CM | POA: Diagnosis not present

## 2018-06-02 DIAGNOSIS — E7800 Pure hypercholesterolemia, unspecified: Secondary | ICD-10-CM | POA: Diagnosis not present

## 2018-06-11 DIAGNOSIS — E119 Type 2 diabetes mellitus without complications: Secondary | ICD-10-CM | POA: Diagnosis not present

## 2018-08-05 ENCOUNTER — Other Ambulatory Visit: Payer: Self-pay | Admitting: Geriatric Medicine

## 2018-08-05 DIAGNOSIS — I10 Essential (primary) hypertension: Secondary | ICD-10-CM | POA: Diagnosis not present

## 2018-08-05 DIAGNOSIS — I700 Atherosclerosis of aorta: Secondary | ICD-10-CM | POA: Diagnosis not present

## 2018-08-05 DIAGNOSIS — E1142 Type 2 diabetes mellitus with diabetic polyneuropathy: Secondary | ICD-10-CM | POA: Diagnosis not present

## 2018-08-05 DIAGNOSIS — I739 Peripheral vascular disease, unspecified: Secondary | ICD-10-CM

## 2018-08-05 DIAGNOSIS — E7800 Pure hypercholesterolemia, unspecified: Secondary | ICD-10-CM | POA: Diagnosis not present

## 2018-08-13 ENCOUNTER — Ambulatory Visit
Admission: RE | Admit: 2018-08-13 | Discharge: 2018-08-13 | Disposition: A | Discharge status: Home / Self Care | Payer: Medicare Other | Source: Ambulatory Visit | Attending: Geriatric Medicine | Admitting: Geriatric Medicine

## 2018-08-13 DIAGNOSIS — I739 Peripheral vascular disease, unspecified: Secondary | ICD-10-CM

## 2018-08-13 DIAGNOSIS — R531 Weakness: Secondary | ICD-10-CM | POA: Diagnosis not present

## 2018-09-19 DIAGNOSIS — Z23 Encounter for immunization: Secondary | ICD-10-CM | POA: Diagnosis not present

## 2018-11-05 DIAGNOSIS — E1142 Type 2 diabetes mellitus with diabetic polyneuropathy: Secondary | ICD-10-CM | POA: Diagnosis not present

## 2018-11-05 DIAGNOSIS — E7800 Pure hypercholesterolemia, unspecified: Secondary | ICD-10-CM | POA: Diagnosis not present

## 2018-11-05 DIAGNOSIS — M6281 Muscle weakness (generalized): Secondary | ICD-10-CM | POA: Diagnosis not present

## 2018-11-05 DIAGNOSIS — I10 Essential (primary) hypertension: Secondary | ICD-10-CM | POA: Diagnosis not present

## 2018-12-03 DIAGNOSIS — E119 Type 2 diabetes mellitus without complications: Secondary | ICD-10-CM | POA: Diagnosis not present

## 2019-01-05 IMAGING — CT CT CHEST W/O CM
3 of 4 series · 16 of 30 positions shown, 18 images · non-contrast
Comparison: January 06, 2017 chest radiograph and chest CT [place]

CLINICAL DATA: History of prostate carcinoma. Hypertension.
Adenopathy on prior CT

EXAM:
CT CHEST WITHOUT CONTRAST
TECHNIQUE: Multidetector CT imaging of the chest was performed following the
standard protocol without IV contrast.

[Series 3: chest w/o · axial · non-contrast · 0.77mm/px · z∈[-240,+18]mm · 7 of 139 slices shown, 9 images]
[im 18/139  mediastinal]
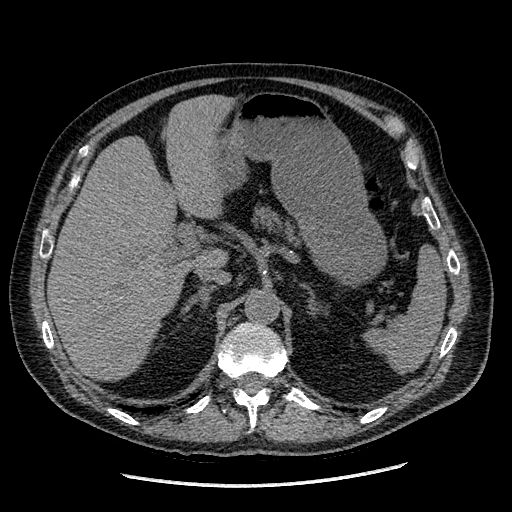
[im 18/139  lung]
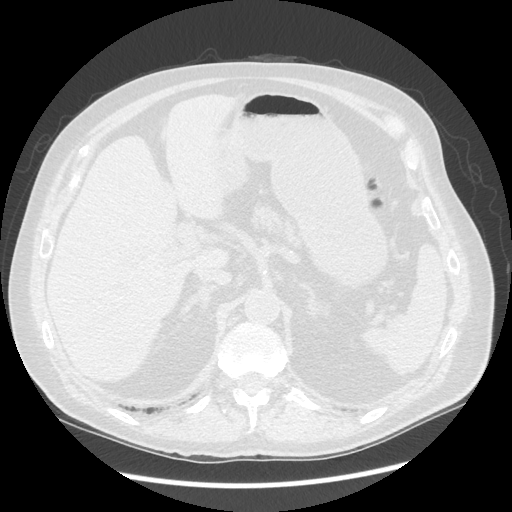
[im 35/139  lung]
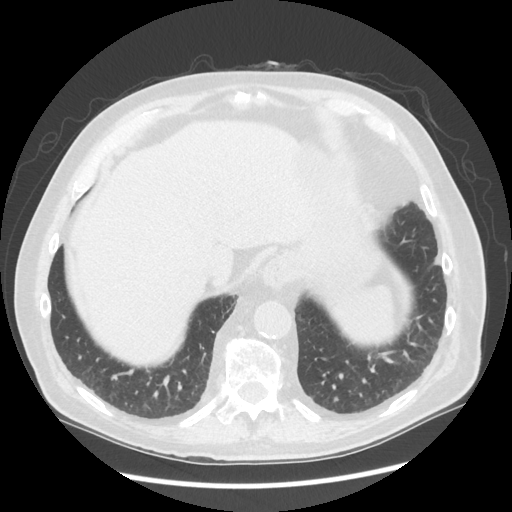
[im 52/139  lung]
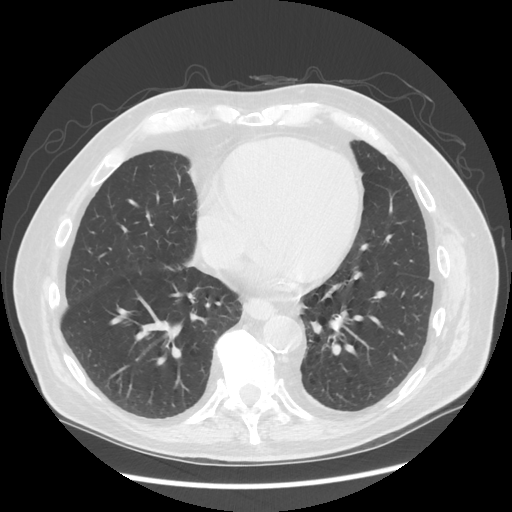
[im 70/139  lung]
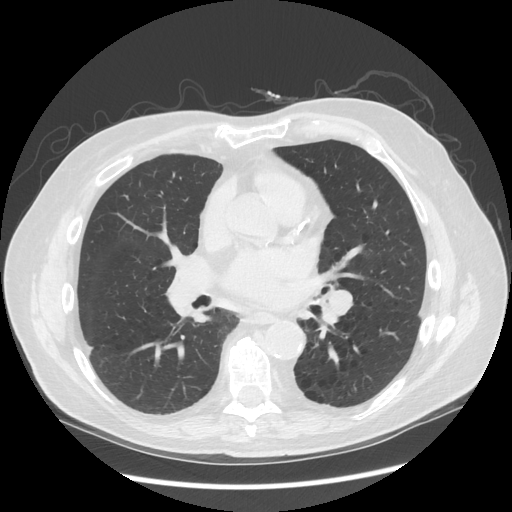
[im 87/139  mediastinal]
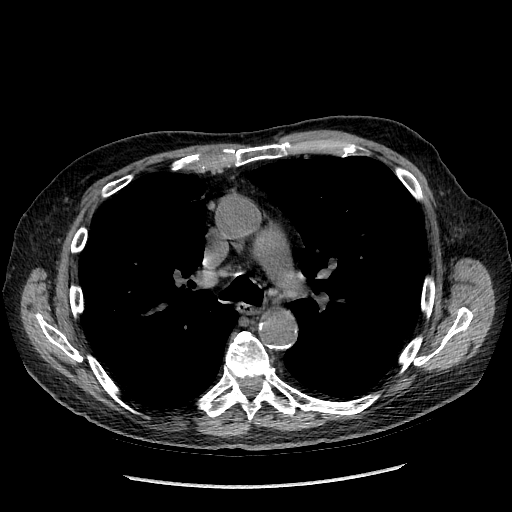
[im 87/139  lung]
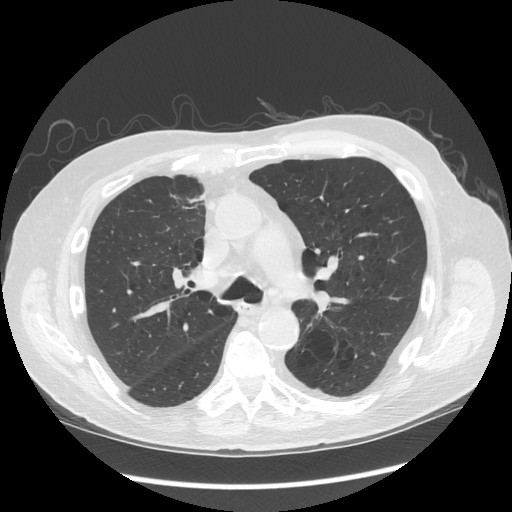
[im 104/139  lung]
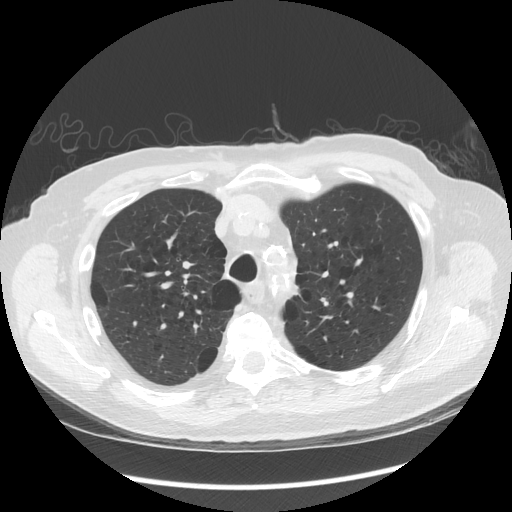
[im 121/139  lung]
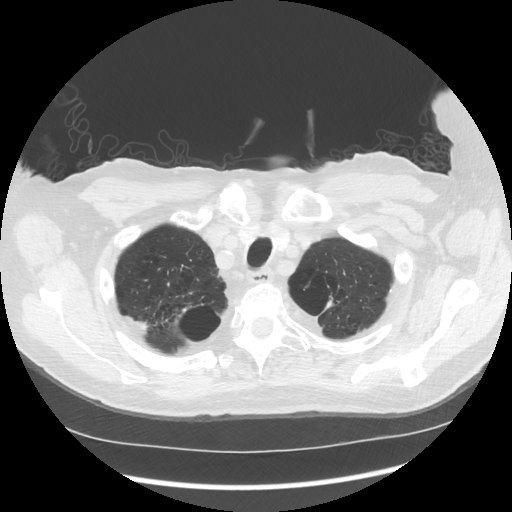

[Series 4: lung windows · axial · 0.77mm/px · z∈[-240,+18]mm · 7 of 139 slices shown]
[im 18/139  lung]
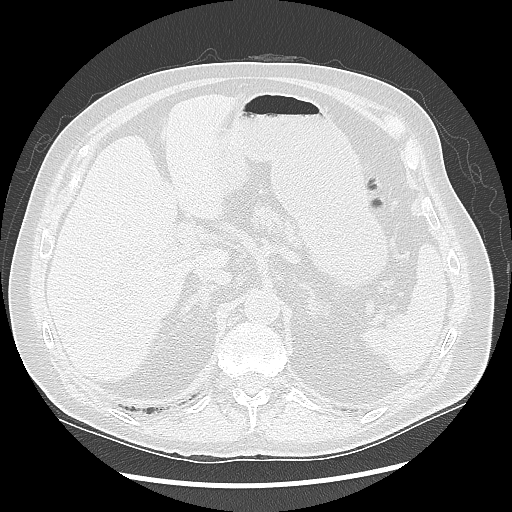
[im 35/139  lung]
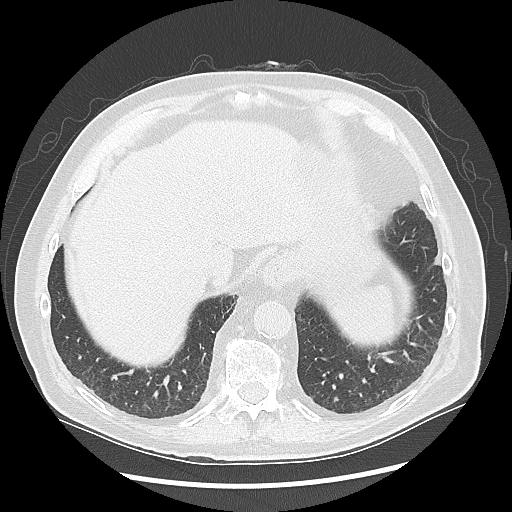
[im 52/139  lung]
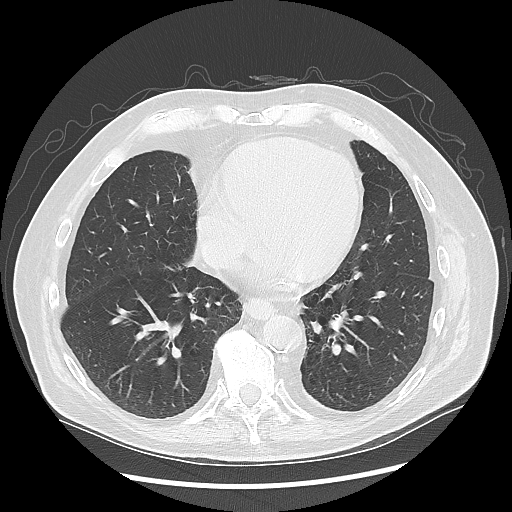
[im 70/139  lung]
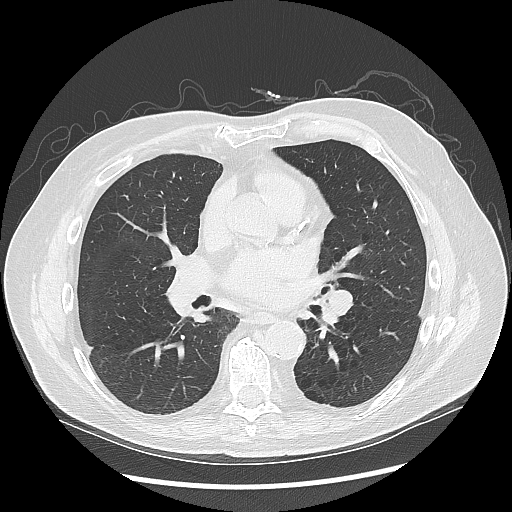
[im 87/139  lung]
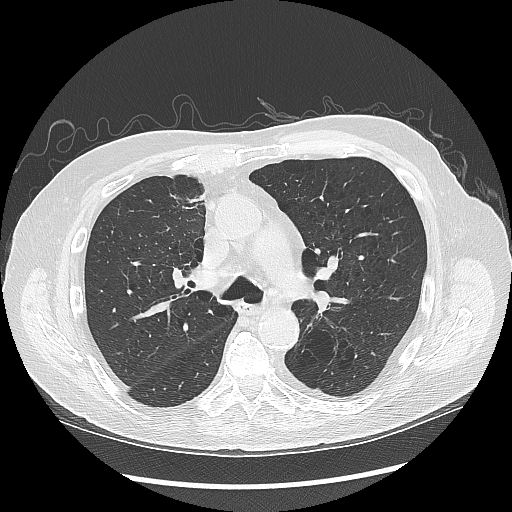
[im 104/139  lung]
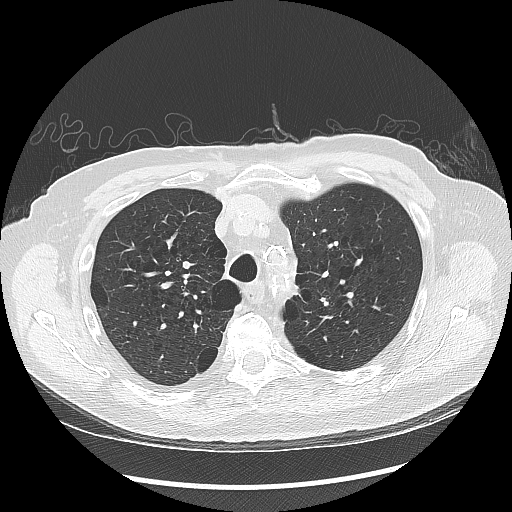
[im 121/139  lung]
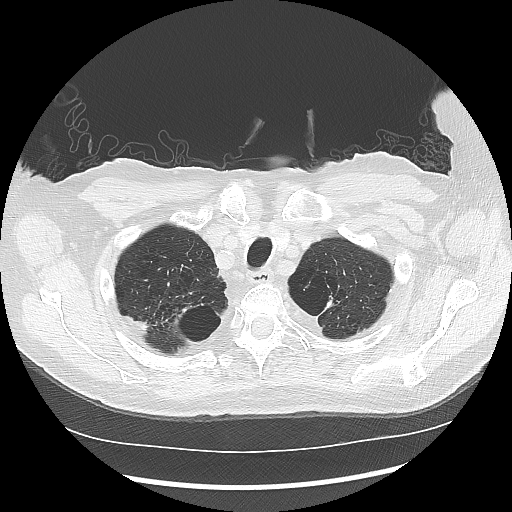

[Series 602: sagittal body · sagittal · 0.77mm/px · 2 of 158 slices shown]
[im 18/158  mediastinal]
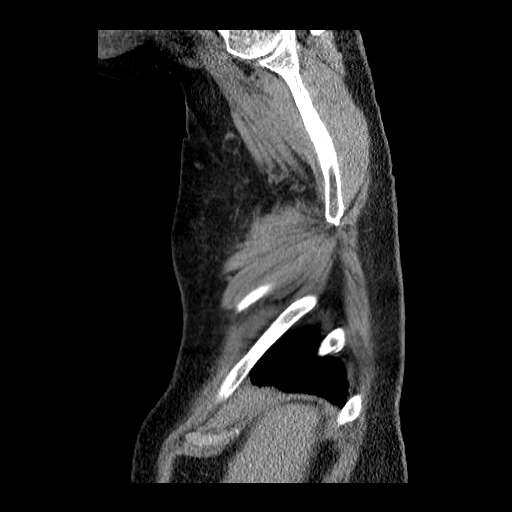
[im 35/158  mediastinal]
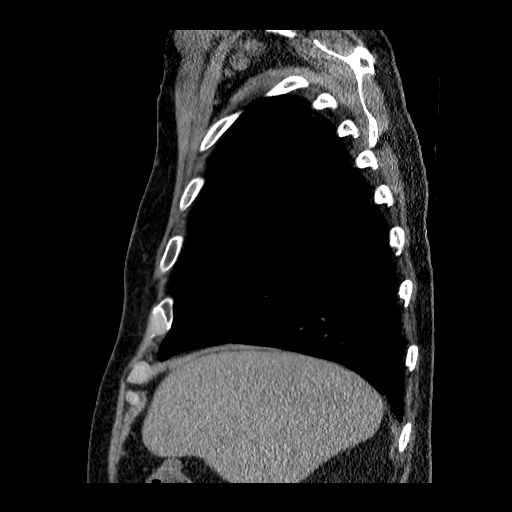

[16 of 30 positions shown; findings below may reference images not displayed]

FINDINGS: Cardiovascular: There is no evident thoracic aortic aneurysm. There
is moderate atherosclerotic calcification in the proximal great
vessels. Note that the right common and left common carotid arteries
arise as a common trunk, an anatomic variant. There is
atherosclerotic calcification throughout the thoracic aorta. There
are foci of coronary artery calcification. Pericardium is not
appreciably thickened. Main pulmonary outflow tract measures 3.5 cm
in diameter, stable.

Mediastinum/Nodes: Thyroid appears unremarkable. The previously
noted subcarinal lymph node which measured 2.4 x 1.6 cm on the prior
study currently measures 1.5 x 0.9 cm, smaller. A previous lymph
node seen in the infrahilar region on the right on the previous
study measuring 1.0 x 0.9 cm. No new lymph node enlargement is
evident. There are several scattered subcentimeter lymph nodes
elsewhere, essentially stable. There is a small hiatal hernia.

Lungs/Pleura: There is persistent bullous disease in lung apices
with scarring in each lung apex, stable. There is underlying
centrilobular and paraseptal emphysematous type change, stable.
There is no edema or consolidation. There is slight lower lobe
bronchiectatic change, stable. No pleural effusion or pleural
thickening evident.

Upper Abdomen: There is atherosclerotic calcification in the aorta.
Gallbladder is absent. There is mild adrenal hypertrophy
bilaterally, stable. Cystic area in the tail of the pancreas is
again noted but better seen on prior study.

Musculoskeletal: There is degenerative change in the thoracic spine.
No blastic or lytic bone lesions.
IMPRESSION: The previously noted prominent lymph nodes in the sub- carinal and
right infrahilar regions have decreased in size. No adenopathy is
evident in the thoracic region currently.

Underlying emphysematous change. Stable bullae and scarring in the
apices. Mild bibasilar atelectasis, stable. No edema or
consolidation. No new parenchymal lung opacity.

Areas of atherosclerotic calcification. Foci of coronary artery
calcification noted. There may be a degree of pulmonary arterial
hypertension given a prominent main pulmonary outflow tract
measuring 3.5 cm in diameter, stable.

Gallbladder absent. Stable mild adrenal hypertrophy. Small cystic
area in the tail of pancreas stable; please see prior study for
recommendations with respect to this small cystic pancreatic mass
which remain in effect.

## 2019-02-18 DIAGNOSIS — Z0000 Encounter for general adult medical examination without abnormal findings: Secondary | ICD-10-CM | POA: Diagnosis not present

## 2019-02-18 DIAGNOSIS — I700 Atherosclerosis of aorta: Secondary | ICD-10-CM | POA: Diagnosis not present

## 2019-02-18 DIAGNOSIS — Z1389 Encounter for screening for other disorder: Secondary | ICD-10-CM | POA: Diagnosis not present

## 2019-02-18 DIAGNOSIS — I10 Essential (primary) hypertension: Secondary | ICD-10-CM | POA: Diagnosis not present

## 2019-02-18 DIAGNOSIS — E7800 Pure hypercholesterolemia, unspecified: Secondary | ICD-10-CM | POA: Diagnosis not present

## 2019-02-18 DIAGNOSIS — D539 Nutritional anemia, unspecified: Secondary | ICD-10-CM | POA: Diagnosis not present

## 2019-06-24 DIAGNOSIS — E1142 Type 2 diabetes mellitus with diabetic polyneuropathy: Secondary | ICD-10-CM | POA: Diagnosis not present

## 2019-06-24 DIAGNOSIS — Z7984 Long term (current) use of oral hypoglycemic drugs: Secondary | ICD-10-CM | POA: Diagnosis not present

## 2019-06-24 DIAGNOSIS — I10 Essential (primary) hypertension: Secondary | ICD-10-CM | POA: Diagnosis not present

## 2019-06-24 DIAGNOSIS — R197 Diarrhea, unspecified: Secondary | ICD-10-CM | POA: Diagnosis not present

## 2019-08-11 DIAGNOSIS — H5032 Intermittent alternating esotropia: Secondary | ICD-10-CM | POA: Diagnosis not present

## 2019-08-11 DIAGNOSIS — H532 Diplopia: Secondary | ICD-10-CM | POA: Diagnosis not present

## 2019-08-11 DIAGNOSIS — H02831 Dermatochalasis of right upper eyelid: Secondary | ICD-10-CM | POA: Diagnosis not present

## 2019-08-11 DIAGNOSIS — E119 Type 2 diabetes mellitus without complications: Secondary | ICD-10-CM | POA: Diagnosis not present

## 2019-08-26 DIAGNOSIS — I10 Essential (primary) hypertension: Secondary | ICD-10-CM | POA: Diagnosis not present

## 2019-08-26 DIAGNOSIS — I700 Atherosclerosis of aorta: Secondary | ICD-10-CM | POA: Diagnosis not present

## 2019-08-26 DIAGNOSIS — E1142 Type 2 diabetes mellitus with diabetic polyneuropathy: Secondary | ICD-10-CM | POA: Diagnosis not present

## 2019-08-26 DIAGNOSIS — Z79899 Other long term (current) drug therapy: Secondary | ICD-10-CM | POA: Diagnosis not present

## 2019-09-30 DIAGNOSIS — Z79899 Other long term (current) drug therapy: Secondary | ICD-10-CM | POA: Diagnosis not present

## 2019-09-30 DIAGNOSIS — E1142 Type 2 diabetes mellitus with diabetic polyneuropathy: Secondary | ICD-10-CM | POA: Diagnosis not present

## 2019-09-30 DIAGNOSIS — I129 Hypertensive chronic kidney disease with stage 1 through stage 4 chronic kidney disease, or unspecified chronic kidney disease: Secondary | ICD-10-CM | POA: Diagnosis not present

## 2019-09-30 DIAGNOSIS — N183 Chronic kidney disease, stage 3 (moderate): Secondary | ICD-10-CM | POA: Diagnosis not present

## 2019-10-29 DIAGNOSIS — Z8546 Personal history of malignant neoplasm of prostate: Secondary | ICD-10-CM | POA: Diagnosis not present

## 2019-10-29 DIAGNOSIS — I10 Essential (primary) hypertension: Secondary | ICD-10-CM | POA: Diagnosis not present

## 2019-10-29 DIAGNOSIS — N1831 Chronic kidney disease, stage 3a: Secondary | ICD-10-CM | POA: Diagnosis not present

## 2019-10-29 DIAGNOSIS — E1142 Type 2 diabetes mellitus with diabetic polyneuropathy: Secondary | ICD-10-CM | POA: Diagnosis not present

## 2019-11-11 DIAGNOSIS — E1142 Type 2 diabetes mellitus with diabetic polyneuropathy: Secondary | ICD-10-CM | POA: Diagnosis not present

## 2019-11-11 DIAGNOSIS — I10 Essential (primary) hypertension: Secondary | ICD-10-CM | POA: Diagnosis not present

## 2019-11-11 DIAGNOSIS — L89621 Pressure ulcer of left heel, stage 1: Secondary | ICD-10-CM | POA: Diagnosis not present

## 2019-11-30 DIAGNOSIS — Z8546 Personal history of malignant neoplasm of prostate: Secondary | ICD-10-CM | POA: Diagnosis not present

## 2019-11-30 DIAGNOSIS — E1142 Type 2 diabetes mellitus with diabetic polyneuropathy: Secondary | ICD-10-CM | POA: Diagnosis not present

## 2019-11-30 DIAGNOSIS — I10 Essential (primary) hypertension: Secondary | ICD-10-CM | POA: Diagnosis not present

## 2019-11-30 DIAGNOSIS — N1831 Chronic kidney disease, stage 3a: Secondary | ICD-10-CM | POA: Diagnosis not present

## 2019-12-30 DIAGNOSIS — Z8546 Personal history of malignant neoplasm of prostate: Secondary | ICD-10-CM | POA: Diagnosis not present

## 2019-12-30 DIAGNOSIS — E1121 Type 2 diabetes mellitus with diabetic nephropathy: Secondary | ICD-10-CM | POA: Diagnosis not present

## 2019-12-30 DIAGNOSIS — E1142 Type 2 diabetes mellitus with diabetic polyneuropathy: Secondary | ICD-10-CM | POA: Diagnosis not present

## 2019-12-30 DIAGNOSIS — N1831 Chronic kidney disease, stage 3a: Secondary | ICD-10-CM | POA: Diagnosis not present

## 2020-01-11 DIAGNOSIS — I129 Hypertensive chronic kidney disease with stage 1 through stage 4 chronic kidney disease, or unspecified chronic kidney disease: Secondary | ICD-10-CM | POA: Diagnosis not present

## 2020-01-11 DIAGNOSIS — N1831 Chronic kidney disease, stage 3a: Secondary | ICD-10-CM | POA: Diagnosis not present

## 2020-01-11 DIAGNOSIS — E1121 Type 2 diabetes mellitus with diabetic nephropathy: Secondary | ICD-10-CM | POA: Diagnosis not present

## 2020-01-11 DIAGNOSIS — E1142 Type 2 diabetes mellitus with diabetic polyneuropathy: Secondary | ICD-10-CM | POA: Diagnosis not present

## 2020-01-27 DIAGNOSIS — Z8546 Personal history of malignant neoplasm of prostate: Secondary | ICD-10-CM | POA: Diagnosis not present

## 2020-01-27 DIAGNOSIS — E1121 Type 2 diabetes mellitus with diabetic nephropathy: Secondary | ICD-10-CM | POA: Diagnosis not present

## 2020-01-27 DIAGNOSIS — I10 Essential (primary) hypertension: Secondary | ICD-10-CM | POA: Diagnosis not present

## 2020-01-27 DIAGNOSIS — N183 Chronic kidney disease, stage 3 unspecified: Secondary | ICD-10-CM | POA: Diagnosis not present

## 2020-01-27 DIAGNOSIS — E1142 Type 2 diabetes mellitus with diabetic polyneuropathy: Secondary | ICD-10-CM | POA: Diagnosis not present

## 2020-02-01 DIAGNOSIS — Z79899 Other long term (current) drug therapy: Secondary | ICD-10-CM | POA: Diagnosis not present

## 2020-02-08 DIAGNOSIS — H40013 Open angle with borderline findings, low risk, bilateral: Secondary | ICD-10-CM | POA: Diagnosis not present

## 2020-02-08 DIAGNOSIS — E119 Type 2 diabetes mellitus without complications: Secondary | ICD-10-CM | POA: Diagnosis not present

## 2020-02-08 DIAGNOSIS — H02831 Dermatochalasis of right upper eyelid: Secondary | ICD-10-CM | POA: Diagnosis not present

## 2020-02-08 DIAGNOSIS — H02834 Dermatochalasis of left upper eyelid: Secondary | ICD-10-CM | POA: Diagnosis not present

## 2020-02-22 DIAGNOSIS — N183 Chronic kidney disease, stage 3 unspecified: Secondary | ICD-10-CM | POA: Diagnosis not present

## 2020-02-22 DIAGNOSIS — E1142 Type 2 diabetes mellitus with diabetic polyneuropathy: Secondary | ICD-10-CM | POA: Diagnosis not present

## 2020-02-22 DIAGNOSIS — E1121 Type 2 diabetes mellitus with diabetic nephropathy: Secondary | ICD-10-CM | POA: Diagnosis not present

## 2020-02-22 DIAGNOSIS — Z8546 Personal history of malignant neoplasm of prostate: Secondary | ICD-10-CM | POA: Diagnosis not present

## 2020-02-26 DIAGNOSIS — I10 Essential (primary) hypertension: Secondary | ICD-10-CM | POA: Diagnosis not present

## 2020-03-16 DIAGNOSIS — E7800 Pure hypercholesterolemia, unspecified: Secondary | ICD-10-CM | POA: Diagnosis not present

## 2020-03-16 DIAGNOSIS — E1142 Type 2 diabetes mellitus with diabetic polyneuropathy: Secondary | ICD-10-CM | POA: Diagnosis not present

## 2020-03-16 DIAGNOSIS — Z0000 Encounter for general adult medical examination without abnormal findings: Secondary | ICD-10-CM | POA: Diagnosis not present

## 2020-03-16 DIAGNOSIS — Z1389 Encounter for screening for other disorder: Secondary | ICD-10-CM | POA: Diagnosis not present

## 2020-03-16 DIAGNOSIS — I700 Atherosclerosis of aorta: Secondary | ICD-10-CM | POA: Diagnosis not present

## 2020-03-30 DIAGNOSIS — I129 Hypertensive chronic kidney disease with stage 1 through stage 4 chronic kidney disease, or unspecified chronic kidney disease: Secondary | ICD-10-CM | POA: Diagnosis not present

## 2020-03-30 DIAGNOSIS — E1142 Type 2 diabetes mellitus with diabetic polyneuropathy: Secondary | ICD-10-CM | POA: Diagnosis not present

## 2020-03-30 DIAGNOSIS — E1121 Type 2 diabetes mellitus with diabetic nephropathy: Secondary | ICD-10-CM | POA: Diagnosis not present

## 2020-03-30 DIAGNOSIS — Z8546 Personal history of malignant neoplasm of prostate: Secondary | ICD-10-CM | POA: Diagnosis not present

## 2020-03-30 DIAGNOSIS — I10 Essential (primary) hypertension: Secondary | ICD-10-CM | POA: Diagnosis not present

## 2020-04-26 DIAGNOSIS — I129 Hypertensive chronic kidney disease with stage 1 through stage 4 chronic kidney disease, or unspecified chronic kidney disease: Secondary | ICD-10-CM | POA: Diagnosis not present

## 2020-04-26 DIAGNOSIS — Z8546 Personal history of malignant neoplasm of prostate: Secondary | ICD-10-CM | POA: Diagnosis not present

## 2020-04-26 DIAGNOSIS — E1142 Type 2 diabetes mellitus with diabetic polyneuropathy: Secondary | ICD-10-CM | POA: Diagnosis not present

## 2020-04-26 DIAGNOSIS — E1121 Type 2 diabetes mellitus with diabetic nephropathy: Secondary | ICD-10-CM | POA: Diagnosis not present

## 2020-04-29 DIAGNOSIS — I10 Essential (primary) hypertension: Secondary | ICD-10-CM | POA: Diagnosis not present

## 2020-05-27 DIAGNOSIS — I10 Essential (primary) hypertension: Secondary | ICD-10-CM | POA: Diagnosis not present

## 2020-06-15 DIAGNOSIS — E1121 Type 2 diabetes mellitus with diabetic nephropathy: Secondary | ICD-10-CM | POA: Diagnosis not present

## 2020-06-15 DIAGNOSIS — I129 Hypertensive chronic kidney disease with stage 1 through stage 4 chronic kidney disease, or unspecified chronic kidney disease: Secondary | ICD-10-CM | POA: Diagnosis not present

## 2020-06-15 DIAGNOSIS — E1142 Type 2 diabetes mellitus with diabetic polyneuropathy: Secondary | ICD-10-CM | POA: Diagnosis not present

## 2020-06-15 DIAGNOSIS — N1831 Chronic kidney disease, stage 3a: Secondary | ICD-10-CM | POA: Diagnosis not present

## 2020-06-29 DIAGNOSIS — I10 Essential (primary) hypertension: Secondary | ICD-10-CM | POA: Diagnosis not present

## 2020-07-14 DIAGNOSIS — N1831 Chronic kidney disease, stage 3a: Secondary | ICD-10-CM | POA: Diagnosis not present

## 2020-07-14 DIAGNOSIS — I700 Atherosclerosis of aorta: Secondary | ICD-10-CM | POA: Diagnosis not present

## 2020-07-14 DIAGNOSIS — I129 Hypertensive chronic kidney disease with stage 1 through stage 4 chronic kidney disease, or unspecified chronic kidney disease: Secondary | ICD-10-CM | POA: Diagnosis not present

## 2020-07-14 DIAGNOSIS — E7800 Pure hypercholesterolemia, unspecified: Secondary | ICD-10-CM | POA: Diagnosis not present

## 2020-07-20 DIAGNOSIS — Z8546 Personal history of malignant neoplasm of prostate: Secondary | ICD-10-CM | POA: Diagnosis not present

## 2020-07-20 DIAGNOSIS — I129 Hypertensive chronic kidney disease with stage 1 through stage 4 chronic kidney disease, or unspecified chronic kidney disease: Secondary | ICD-10-CM | POA: Diagnosis not present

## 2020-07-20 DIAGNOSIS — E7800 Pure hypercholesterolemia, unspecified: Secondary | ICD-10-CM | POA: Diagnosis not present

## 2020-07-20 DIAGNOSIS — E1142 Type 2 diabetes mellitus with diabetic polyneuropathy: Secondary | ICD-10-CM | POA: Diagnosis not present

## 2020-07-29 DIAGNOSIS — I10 Essential (primary) hypertension: Secondary | ICD-10-CM | POA: Diagnosis not present

## 2020-07-30 DIAGNOSIS — I10 Essential (primary) hypertension: Secondary | ICD-10-CM | POA: Diagnosis not present

## 2020-08-30 DIAGNOSIS — I10 Essential (primary) hypertension: Secondary | ICD-10-CM | POA: Diagnosis not present

## 2020-09-02 DIAGNOSIS — E7800 Pure hypercholesterolemia, unspecified: Secondary | ICD-10-CM | POA: Diagnosis not present

## 2020-09-02 DIAGNOSIS — Z8546 Personal history of malignant neoplasm of prostate: Secondary | ICD-10-CM | POA: Diagnosis not present

## 2020-09-02 DIAGNOSIS — E1142 Type 2 diabetes mellitus with diabetic polyneuropathy: Secondary | ICD-10-CM | POA: Diagnosis not present

## 2020-09-02 DIAGNOSIS — I129 Hypertensive chronic kidney disease with stage 1 through stage 4 chronic kidney disease, or unspecified chronic kidney disease: Secondary | ICD-10-CM | POA: Diagnosis not present

## 2020-09-29 DIAGNOSIS — I10 Essential (primary) hypertension: Secondary | ICD-10-CM | POA: Diagnosis not present

## 2020-10-18 DIAGNOSIS — E7800 Pure hypercholesterolemia, unspecified: Secondary | ICD-10-CM | POA: Diagnosis not present

## 2020-10-18 DIAGNOSIS — I129 Hypertensive chronic kidney disease with stage 1 through stage 4 chronic kidney disease, or unspecified chronic kidney disease: Secondary | ICD-10-CM | POA: Diagnosis not present

## 2020-10-18 DIAGNOSIS — Z8546 Personal history of malignant neoplasm of prostate: Secondary | ICD-10-CM | POA: Diagnosis not present

## 2020-10-18 DIAGNOSIS — E1142 Type 2 diabetes mellitus with diabetic polyneuropathy: Secondary | ICD-10-CM | POA: Diagnosis not present

## 2020-10-28 DIAGNOSIS — I10 Essential (primary) hypertension: Secondary | ICD-10-CM | POA: Diagnosis not present

## 2020-11-22 ENCOUNTER — Other Ambulatory Visit: Payer: Self-pay | Admitting: Geriatric Medicine

## 2020-11-22 DIAGNOSIS — I129 Hypertensive chronic kidney disease with stage 1 through stage 4 chronic kidney disease, or unspecified chronic kidney disease: Secondary | ICD-10-CM | POA: Diagnosis not present

## 2020-11-22 DIAGNOSIS — I10 Essential (primary) hypertension: Secondary | ICD-10-CM | POA: Diagnosis not present

## 2020-11-22 DIAGNOSIS — E1142 Type 2 diabetes mellitus with diabetic polyneuropathy: Secondary | ICD-10-CM | POA: Diagnosis not present

## 2020-11-22 DIAGNOSIS — K862 Cyst of pancreas: Secondary | ICD-10-CM

## 2020-11-22 DIAGNOSIS — N1831 Chronic kidney disease, stage 3a: Secondary | ICD-10-CM | POA: Diagnosis not present

## 2020-11-29 DIAGNOSIS — I10 Essential (primary) hypertension: Secondary | ICD-10-CM | POA: Diagnosis not present

## 2020-12-09 DIAGNOSIS — E1142 Type 2 diabetes mellitus with diabetic polyneuropathy: Secondary | ICD-10-CM | POA: Diagnosis not present

## 2020-12-09 DIAGNOSIS — I129 Hypertensive chronic kidney disease with stage 1 through stage 4 chronic kidney disease, or unspecified chronic kidney disease: Secondary | ICD-10-CM | POA: Diagnosis not present

## 2020-12-09 DIAGNOSIS — E7800 Pure hypercholesterolemia, unspecified: Secondary | ICD-10-CM | POA: Diagnosis not present

## 2020-12-09 DIAGNOSIS — I10 Essential (primary) hypertension: Secondary | ICD-10-CM | POA: Diagnosis not present

## 2020-12-14 ENCOUNTER — Other Ambulatory Visit: Payer: Self-pay

## 2020-12-14 ENCOUNTER — Ambulatory Visit
Admission: RE | Admit: 2020-12-14 | Discharge: 2020-12-14 | Disposition: A | Discharge status: Home / Self Care | Payer: Medicare Other | Source: Ambulatory Visit | Attending: Geriatric Medicine | Admitting: Geriatric Medicine

## 2020-12-14 DIAGNOSIS — K862 Cyst of pancreas: Secondary | ICD-10-CM

## 2020-12-14 DIAGNOSIS — Z8546 Personal history of malignant neoplasm of prostate: Secondary | ICD-10-CM | POA: Diagnosis not present

## 2020-12-14 DIAGNOSIS — K449 Diaphragmatic hernia without obstruction or gangrene: Secondary | ICD-10-CM | POA: Diagnosis not present

## 2020-12-14 DIAGNOSIS — N281 Cyst of kidney, acquired: Secondary | ICD-10-CM | POA: Diagnosis not present

## 2020-12-14 MED ORDER — GADOBENATE DIMEGLUMINE 529 MG/ML IV SOLN
18.0000 mL | Freq: Once | INTRAVENOUS | Status: AC | PRN
  Administered 2020-12-14: 18 mL via INTRAVENOUS

## 2020-12-30 DIAGNOSIS — I10 Essential (primary) hypertension: Secondary | ICD-10-CM | POA: Diagnosis not present

## 2021-01-18 DIAGNOSIS — E7800 Pure hypercholesterolemia, unspecified: Secondary | ICD-10-CM | POA: Diagnosis not present

## 2021-01-18 DIAGNOSIS — E1142 Type 2 diabetes mellitus with diabetic polyneuropathy: Secondary | ICD-10-CM | POA: Diagnosis not present

## 2021-01-18 DIAGNOSIS — Z8546 Personal history of malignant neoplasm of prostate: Secondary | ICD-10-CM | POA: Diagnosis not present

## 2021-01-18 DIAGNOSIS — I10 Essential (primary) hypertension: Secondary | ICD-10-CM | POA: Diagnosis not present

## 2021-01-30 DIAGNOSIS — I10 Essential (primary) hypertension: Secondary | ICD-10-CM | POA: Diagnosis not present

## 2021-02-08 DIAGNOSIS — H02834 Dermatochalasis of left upper eyelid: Secondary | ICD-10-CM | POA: Diagnosis not present

## 2021-02-08 DIAGNOSIS — H02831 Dermatochalasis of right upper eyelid: Secondary | ICD-10-CM | POA: Diagnosis not present

## 2021-02-08 DIAGNOSIS — E119 Type 2 diabetes mellitus without complications: Secondary | ICD-10-CM | POA: Diagnosis not present

## 2021-02-08 DIAGNOSIS — H40013 Open angle with borderline findings, low risk, bilateral: Secondary | ICD-10-CM | POA: Diagnosis not present

## 2021-02-22 DIAGNOSIS — E1121 Type 2 diabetes mellitus with diabetic nephropathy: Secondary | ICD-10-CM | POA: Diagnosis not present

## 2021-02-22 DIAGNOSIS — I10 Essential (primary) hypertension: Secondary | ICD-10-CM | POA: Diagnosis not present

## 2021-02-22 DIAGNOSIS — N183 Chronic kidney disease, stage 3 unspecified: Secondary | ICD-10-CM | POA: Diagnosis not present

## 2021-02-22 DIAGNOSIS — E7800 Pure hypercholesterolemia, unspecified: Secondary | ICD-10-CM | POA: Diagnosis not present

## 2021-02-27 DIAGNOSIS — I10 Essential (primary) hypertension: Secondary | ICD-10-CM | POA: Diagnosis not present

## 2021-03-27 DIAGNOSIS — Z0000 Encounter for general adult medical examination without abnormal findings: Secondary | ICD-10-CM | POA: Diagnosis not present

## 2021-03-27 DIAGNOSIS — Z1389 Encounter for screening for other disorder: Secondary | ICD-10-CM | POA: Diagnosis not present

## 2021-03-27 DIAGNOSIS — E1142 Type 2 diabetes mellitus with diabetic polyneuropathy: Secondary | ICD-10-CM | POA: Diagnosis not present

## 2021-03-27 DIAGNOSIS — I700 Atherosclerosis of aorta: Secondary | ICD-10-CM | POA: Diagnosis not present

## 2021-03-27 DIAGNOSIS — E7800 Pure hypercholesterolemia, unspecified: Secondary | ICD-10-CM | POA: Diagnosis not present

## 2021-03-30 DIAGNOSIS — I10 Essential (primary) hypertension: Secondary | ICD-10-CM | POA: Diagnosis not present

## 2021-04-08 DIAGNOSIS — E1142 Type 2 diabetes mellitus with diabetic polyneuropathy: Secondary | ICD-10-CM | POA: Diagnosis not present

## 2021-04-08 DIAGNOSIS — E7800 Pure hypercholesterolemia, unspecified: Secondary | ICD-10-CM | POA: Diagnosis not present

## 2021-04-08 DIAGNOSIS — N1831 Chronic kidney disease, stage 3a: Secondary | ICD-10-CM | POA: Diagnosis not present

## 2021-04-08 DIAGNOSIS — E1121 Type 2 diabetes mellitus with diabetic nephropathy: Secondary | ICD-10-CM | POA: Diagnosis not present

## 2021-04-28 DIAGNOSIS — I10 Essential (primary) hypertension: Secondary | ICD-10-CM | POA: Diagnosis not present

## 2021-05-01 DIAGNOSIS — Z8546 Personal history of malignant neoplasm of prostate: Secondary | ICD-10-CM | POA: Diagnosis not present

## 2021-05-01 DIAGNOSIS — I129 Hypertensive chronic kidney disease with stage 1 through stage 4 chronic kidney disease, or unspecified chronic kidney disease: Secondary | ICD-10-CM | POA: Diagnosis not present

## 2021-05-01 DIAGNOSIS — E1142 Type 2 diabetes mellitus with diabetic polyneuropathy: Secondary | ICD-10-CM | POA: Diagnosis not present

## 2021-05-01 DIAGNOSIS — E7800 Pure hypercholesterolemia, unspecified: Secondary | ICD-10-CM | POA: Diagnosis not present

## 2021-05-05 NOTE — Therapy (Signed)
OUTPATIENT PHYSICAL THERAPY VESTIBULAR EVALUATION  Patient Name: Carl Strong MRN: [id number] DOB:[birth year], 87 y.o., male Today's Date: 05/08/2021  PCP: Lajean Manes, MD REFERRING PROVIDER: Lajean Manes, MD   PT End of Session - 05/08/21 1401    Visit Number 1    Number of Visits 6    Date for PT Re-Evaluation 07/07/21   POC for 5 weeks, Cert for 60 days   Authorization Type BCBS Medicare (10th Visit PN)    PT Start Time 1402    PT Stop Time 1445    PT Time Calculation (min) 43 min    Activity Tolerance Patient tolerated treatment well    Behavior During Therapy WFL for tasks assessed/performed           Past Medical History:  Diagnosis Date  . Arthritis    "left hand" (01/08/2017)  . GERD (gastroesophageal reflux disease)   . High cholesterol   . History of kidney stones   . Hypertension   . Osteopenia   . Prostate cancer ([facility]) 2005  . Type II diabetes mellitus ([facility])    Past Surgical History:  Procedure Laterality Date  . CARPAL TUNNEL RELEASE Right 06/28/2016   Procedure: RIGHT CARPAL TUNNEL RELEASE;  Surgeon: Daryll Brod, MD;  Location: [facility];  Service: Orthopedics;  Laterality: Right;  ANESTHESIA:  IV REGIONAL FAB  . CYSTOSCOPY W/ STONE MANIPULATION    . LAPAROSCOPIC CHOLECYSTECTOMY    . PROSTATECTOMY  04/2004   Archie Endo 05/16/2011  . TONSILLECTOMY    . URETHRAL STRICTURE DILATATION  07/2005   Archie Endo 05/16/2011   Patient Active Problem List   Diagnosis Date Noted  . Acute encephalopathy 01/06/2017  . Hypoglycemia 01/06/2017  . Hypertension   . GERD (gastroesophageal reflux disease)   . Diabetes mellitus without complication ([facility])     REFERRING DIAG: Vertigo  Onset date: 05/02/21 (Referral Date)   THERAPY DIAG:  Dizziness and giddiness  BPPV (benign paroxysmal positional vertigo), right  Unsteadiness on feet  SUBJECTIVE:  PATIENT HISTORY:  Known to this clinic for prior history of Vertigo/BPPV (2017). Patient reports  reoccurrence of vertigo that bothers him with turning over in the bed, and when he stands up. Patient reports some spinning sensation along with a nauseous sensation when completing Brandt-Daroff that was provided at prior POC. Always experiencing in the morning. Started back approx 3-4 weeks ago. Patient reports occasional diplopia.  Denies Hearing Changes. Reports a fall few months prior when carrying groceries. Denies sudden change in weakness/sensation.   PAIN: is patient experiencing pain? No  PRECAUTIONS: Other: Prostate CA, HTN, DM II, L2-3 Stenosis, L3 Compression Fracture, Osteopenia, B-12 Deficiency  WEIGHT BEARING RESTRICTIONS: No  FALLS: Has patient fallen in last 6 months? Yes, Number of falls: 2  LIVING ENVIRONMENT: Lives Alone Lives in: House/apartment Stairs: Yes; External: 6 steps; Rail on Both; going up Has following equipment at home: None  PLOF: Independent and Retired  PATIENT GOALS: Resolve Dizziness  OBJECTIVE:   DIAGNOSTIC FINDINGS: None  COGNITION: Overall cognitive status: Within functional limits for tasks assessed    SENSATION: Light touch: Appears intact   STRENGTH: WFL   TRANSFERS:  Assistive device utilized: None Sit to stand: Modified independence Stand to sit: Modified independence  GAIT: Distance walked: into/out of therapy gym without AD; mild unsteadiness after BPPV treatment Assistive device utilized: None Level of assistance: Modified independence   PATIENT SURVEYS:  FOTO DPS: 46, DFS: 55.5   VESTIBULAR ASSESSMENT      SYMPTOM BEHAVIOR:  Subjective history: Prior History of BPPV, known to clinic in 2017   Non-Vestibular symptoms: diplopia, headaches and nausea/vomiting   Type of dizziness: Diplopia, Spinning/Vertigo, Unsteady with head/body turns and Lightheadedness/Faint   Frequency: daily   Duration: < 30 seconds   Aggravating factors: Induced by position change: lying supine, rolling to the right and sit to stand and  Worse in the morning   Relieving factors: head stationary, lying supine and stay still   Progression of symptoms: unchanged   OCULOMOTOR EXAM:   Ocular Alignment: normal   Ocular ROM: No Limitations   Spontaneous Nystagmus: absent   Gaze-Induced Nystagmus: absent   Smooth Pursuits: intact  Reports mild dizziness with completion   Saccades: intact    VESTIBULAR - OCULAR REFLEX:    Slow VOR: Normal   VOR Cancellation: Normal   Head-Impulse Test: HIT Right: positive HIT Left: positive       POSITIONAL TESTING: Right Dix-Hallpike: upbeating, right nystagmus; Duration: 10-15 seconds Left Dix-Hallpike: none; Duration: Right Roll Test: none; Duration: Left Roll Test: none; Duration:    VESTIBULAR TREATMENT:  Canalith Repositioning:   Epley Right: Number of Reps: 2, Response to Treatment: symptoms improved and Comment: mild nauseous with completion   PATIENT EDUCATION:  Education details: Educated on Surveyor, quantity; BPPV Person educated: Patient Education method: Explanation Education comprehension: verbalized understanding  HOME EXERCISE PROGRAM: No HEP initiated today  ASSESSMENT:  CLINICAL IMPRESSION:  Patient is a 87 y.o. male who was seen today for Vertigo. Objective impairments include decreased activity tolerance, decreased balance and dizziness. Upon evaluation patient presents with positive HIT bilaterally indicating impaired VOR. Patient also presenting with R Upbeating Nystagmus of short duration, indicating reoccurrence of R Posterior Canal Canalithiasis. Completed CRM x 2 reps, with improved symptoms noted. These impairments are limiting patient from community activity, driving and yard work. Personal factors including Age and 3+ comorbidities: Prostate CA, HTN, DM II, L2-3 Stenosis, L3 Compression Fracture, Osteopenia, B-12 Deficiency are also affecting patient's functional outcome. Patient will benefit from skilled PT to address above impairments and  improve overall function.  REHAB POTENTIAL: Good  CLINICAL DECISION MAKING: Stable/uncomplicated  EVALUATION COMPLEXITY: Low   GOALS: Goals reviewed with patient? Yes  SHORT TERM GOALS:  STG Name Target Date Goal status  1 Patient will undergo assessment of FGA and LTG to be set as appropriate Comments: TBA 05/26/21 INITIAL   LONG TERM GOALS:   LTG Name Target Date Goal status  1 Patient will improve DFS >/= 59 , and DPS >/= 55 Comments: DPS" 46, DFS: 55.5 06/16/21 INITIAL  2 Patient will demonstrate (-) positional testing to indicate resolution of BPPV Comments:  06/16/21 INITIAL  3 LTG to be set for FGA as appropriate Comments:  06/16/21 INITIAL  4 Patient will be independent with Vestibular/Balance HEP Comments:  06/16/21 INITIAL   PLAN: PT frequency: 1x/week  PT duration: other: 5 weeks  Planned Interventions: Therapeutic exercises, Therapeutic activity, Neuro Muscular re-education, Balance training, Gait training, Patient/Family education, Joint mobilization, Vestibular training and Canalith repositioning  Plan for next session: Reassess R BPPV and treat as indicated. Assess FGA. Initiate VOR x 1 and Balance HEP  Jones Bales, PT, DPT 05/08/2021, 2:57 PM  [facility] [address] [place], [place], [address] Phone: [phone]   Fax:  [phone]  Patient name: Carl Strong MRN: [id number] DOB: January 07, 1929

## 2021-05-08 ENCOUNTER — Other Ambulatory Visit: Payer: Self-pay

## 2021-05-08 ENCOUNTER — Ambulatory Visit: Payer: Medicare Other | Attending: Geriatric Medicine

## 2021-05-08 DIAGNOSIS — R42 Dizziness and giddiness: Secondary | ICD-10-CM | POA: Diagnosis not present

## 2021-05-08 DIAGNOSIS — R2681 Unsteadiness on feet: Secondary | ICD-10-CM | POA: Insufficient documentation

## 2021-05-08 DIAGNOSIS — H8111 Benign paroxysmal vertigo, right ear: Secondary | ICD-10-CM | POA: Insufficient documentation

## 2021-05-15 NOTE — Therapy (Signed)
OUTPATIENT PHYSICAL THERAPY VESTIBULAR TREATMENT NOTE   Patient Name: Carl Strong MRN: [id number] DOB:19-Mar-1929, 87 y.o., male Today's Date: 05/16/2021  PCP: Lajean Manes, MD REFERRING PROVIDER: Lajean Manes, MD   PT End of Session - 05/16/21 1101    Visit Number 2    Number of Visits 6    Date for PT Re-Evaluation 07/07/21   POC for 5 weeks, Cert for 60 days   Authorization Type BCBS Medicare (10th Visit PN)    PT Start Time 1102    PT Stop Time 1130    PT Time Calculation (min) 28 min    Activity Tolerance Patient tolerated treatment well    Behavior During Therapy WFL for tasks assessed/performed           Past Medical History:  Diagnosis Date  . Arthritis    "left hand" (01/08/2017)  . GERD (gastroesophageal reflux disease)   . High cholesterol   . History of kidney stones   . Hypertension   . Osteopenia   . Prostate cancer ([facility]) 2005  . Type II diabetes mellitus ([facility])    Past Surgical History:  Procedure Laterality Date  . CARPAL TUNNEL RELEASE Right 06/28/2016   Procedure: RIGHT CARPAL TUNNEL RELEASE;  Surgeon: Daryll Brod, MD;  Location: [facility];  Service: Orthopedics;  Laterality: Right;  ANESTHESIA:  IV REGIONAL FAB  . CYSTOSCOPY W/ STONE MANIPULATION    . LAPAROSCOPIC CHOLECYSTECTOMY    . PROSTATECTOMY  04/2004   Archie Endo 05/16/2011  . TONSILLECTOMY    . URETHRAL STRICTURE DILATATION  07/2005   Archie Endo 05/16/2011   Patient Active Problem List   Diagnosis Date Noted  . Acute encephalopathy 01/06/2017  . Hypoglycemia 01/06/2017  . Hypertension   . GERD (gastroesophageal reflux disease)   . Diabetes mellitus without complication ([facility])     REFERRING DIAG: Vertigo   THERAPY DIAG:  Dizziness and giddiness  BPPV (benign paroxysmal positional vertigo), right  Unsteadiness on feet  SUBJECTIVE: Continue to report spinning sensation in morning, very mild. Patient reports no other new changes/complaints.  PAIN:  Are you having  pain? No   OBJECTIVE:   POSITIONAL TESTS:  Right Dix-Hallpike: upbeating, right nystagmus; Duration: <10 seconds Left Dix-Hallpike: none; Duration: None  VESTIBULAR TREATMENT:  Canalith Repositioning: Epley Right: Number of Reps: 3, Response to Treatment: symptoms improved and Comment: no nystagmus on last rep   Other: Due to patient copay, Pt requesting if he could attempt to treat at home. PT educating on R epley maneuver, with PT demonstrating and reviewing handout. Patient verbalized understanding.    PATIENT EDUCATION:  Education details: Land (See Patient Instructions)  Person educated: Patient Education method: Explanation Education comprehension: verbalized understanding  HOME EXERCISE PROGRAM: Access Code: [email or web address] URL: [email or web address] Date: 05/16/2021 Prepared by: Baldomero Lamy  Patient Education Right Epley Maneuver  ASSESSMENT:  CLINICAL IMPRESSION:  Today's skilled PT session included reassessment of BPPV. Patient continue to demo mild R Upbeating Nystagmus with R Dix-Hallpike. Completed epley x 3 reps with resolution noted. PT educating on R Epley maneuver for self management per patient request due to copay. Pt will follow up in approx 2-3 weeks. Will continue to progress toward all LTGs.   REHAB POTENTIAL: Good  CLINICAL DECISION MAKING: Stable/uncomplicated  EVALUATION COMPLEXITY: Low   GOALS: Goals reviewed with patient? Yes  SHORT TERM GOALS:  STG Name Target Date Goal status  1 Patient will undergo assessment of FGA and LTG to be set as appropriate Comments: TBA  05/26/21 INITIAL   LONG TERM GOALS:   LTG Name Target Date Goal status  1 Patient will improve DFS >/= 59 , and DPS >/= 55 Comments: DPS" 46, DFS: 55.5 06/16/21 INITIAL  2 Patient will demonstrate (-) positional testing to indicate resolution of BPPV Comments:  06/16/21 INITIAL  3 LTG to be set for FGA as appropriate Comments:  06/16/21  INITIAL  4 Patient will be independent with Vestibular/Balance HEP Comments:  06/16/21 INITIAL   PLAN: PT frequency: 1x/week  PT duration: other: 5 weeks  Planned Interventions: Therapeutic exercises, Therapeutic activity, Neuro Muscular re-education, Balance training, Gait training, Patient/Family education, Joint mobilization, Vestibular training and Canalith repositioning  Plan for next session: Reassess R BPPV and treat as indicated. How was treating at home?      Jones Bales, PT, DPT 05/16/2021, 11:35 AM    [facility] [address] [place], [place], [address] Phone: [phone]   Fax:  [phone]  Patient name: Carl Strong MRN: [id number] DOB: 08/29/1929

## 2021-05-16 ENCOUNTER — Other Ambulatory Visit: Payer: Self-pay

## 2021-05-16 ENCOUNTER — Ambulatory Visit: Payer: Medicare Other

## 2021-05-16 DIAGNOSIS — R2681 Unsteadiness on feet: Secondary | ICD-10-CM

## 2021-05-16 DIAGNOSIS — R42 Dizziness and giddiness: Secondary | ICD-10-CM | POA: Diagnosis not present

## 2021-05-16 DIAGNOSIS — H8111 Benign paroxysmal vertigo, right ear: Secondary | ICD-10-CM

## 2021-05-30 DIAGNOSIS — I10 Essential (primary) hypertension: Secondary | ICD-10-CM | POA: Diagnosis not present

## 2021-06-08 NOTE — Therapy (Addendum)
OUTPATIENT PHYSICAL THERAPY VESTIBULAR TREATMENT NOTE/DISCHARGE SUMMARY   Patient Name: Carl Strong MRN: [id number] DOB:11-14-1929, 87 y.o., male Today's Date: 06/09/2021  PCP: Lajean Manes, MD REFERRING PROVIDER: Lajean Manes, MD  PHYSICAL THERAPY DISCHARGE SUMMARY  Visits from Start of Care: 3  Current functional level related to goals / functional outcomes: See Clinical Impression Statement   Remaining deficits: None   Education / Equipment: HEP Provided   Patient agrees to discharge. Patient goals were met. Patient is being discharged due to meeting the stated rehab goals.     PT End of Session - 06/09/21 1447     Visit Number 3    Number of Visits 6    Date for PT Re-Evaluation 07/07/21   POC for 5 weeks, Cert for 60 days   Authorization Type BCBS Medicare (10th Visit PN)    PT Start Time 1446    PT Stop Time 1528    PT Time Calculation (min) 42 min    Activity Tolerance Patient tolerated treatment well    Behavior During Therapy WFL for tasks assessed/performed              Past Medical History:  Diagnosis Date   Arthritis    "left hand" (01/08/2017)   GERD (gastroesophageal reflux disease)    High cholesterol    History of kidney stones    Hypertension    Osteopenia    Prostate cancer ([facility]) 2005   Type II diabetes mellitus ([facility])    Past Surgical History:  Procedure Laterality Date   CARPAL TUNNEL RELEASE Right 06/28/2016   Procedure: RIGHT CARPAL TUNNEL RELEASE;  Surgeon: Daryll Brod, MD;  Location: [facility];  Service: Orthopedics;  Laterality: Right;  ANESTHESIA:  IV REGIONAL FAB   CYSTOSCOPY W/ STONE MANIPULATION     LAPAROSCOPIC CHOLECYSTECTOMY     PROSTATECTOMY  04/2004   /notes 05/16/2011   TONSILLECTOMY     URETHRAL STRICTURE DILATATION  07/2005   Archie Endo 05/16/2011   Patient Active Problem List   Diagnosis Date Noted   Acute encephalopathy 01/06/2017   Hypoglycemia 01/06/2017   Hypertension    GERD  (gastroesophageal reflux disease)    Diabetes mellitus without complication ([facility])     REFERRING DIAG: Vertigo   THERAPY DIAG:  Dizziness and giddiness  BPPV (benign paroxysmal positional vertigo), right  Unsteadiness on feet  SUBJECTIVE: Patient reports no episodes of the dizziness since last visit. Patient reports that he has had some lightheadedness.   PAIN:  Are you having pain? No   OBJECTIVE:   Reassessed FOTO: DPS: 70, DFS: 62.2  ORTHOSTATICS:   Supine: 141/67, HR: 63 (no symptoms)  Seated: 157/75, HR: 70 (mild lightheadedness)    Standing (1 Minute): 155/75, HR: 69 (no symptoms)  Standing (3 Minute):  152/774, HR: 72 (no symptoms)   POSITIONAL TESTS:  Right Dix-Hallpike: none; Duration: None Left Dix-Hallpike: none; Duration: None Right Horizontal Roll: None; Duration: None Left Horizontal Roll: None; Duration: None   VESTIBULAR TREATMENT:  Reviewed R Epley with patient to ensure proper completion for treatment at home. Patient able to demonstrate properly; education on how to assess and when to complete treatment based upon assessment. Patient verbalized understanding.   PT TREATMENT:   Completed all of the following during session and established balance HEP:  Tandem Stance    Right foot in front of left, heel touching toe both feet "straight ahead". Stand on Foot Triangle of Support with both feet. Balance in this position 30 seconds.  Do with left foot in front of right.   Balance: Eyes Closed - Bilateral (Varied Surfaces)    Stand, feet shoulder width, close eyes. Maintain balance 30 seconds. Repeat 3 times per set. Do 1 sets per session. Do 2-3 sessions per week. Repeat on compliant surface: pillow  Copyright  VHI. All rights reserved.    Feet Together (Compliant Surface) Head Motion - Eyes Open    With eyes open, standing on compliant surface: pillow, feet together, move head slowly: up and down, followed by right and left.  Repeat 10  times per session. Do 1 sessions per day.  Copyright  VHI. All rights reserved.   PATIENT EDUCATION:  Education details: Reviewed Land; Balance HEP Person educated: Patient Education method: Explanation Education comprehension: verbalized understanding   HOME EXERCISE PROGRAM: Access Code: [email or web address] URL: [email or web address] Date: 05/16/2021 Prepared by: Baldomero Lamy  Patient Education Right Epley Maneuver   ASSESSMENT:   CLINICAL IMPRESSION:  Today's skilled PT session included reassessment of BPPV, no nystagmus or symptoms with positional testing indicating resolution of R Posterior Canal Canalithiasis. PT reviewed R Epley with patient to ensure proper completion for treatment at home in case of reoccurrence upon d/c. Assessed orthostatics, with no drop noted today. Rest of session spent establishing initial HEP focused on corner balance. Patient demonstrating readiness for d/c, patient verbalizing agreement.    REHAB POTENTIAL: Good   CLINICAL DECISION MAKING: Stable/uncomplicated   EVALUATION COMPLEXITY: Low     GOALS: Goals reviewed with patient? Yes   SHORT TERM GOALS:   STG Name Target Date Goal status  1 Patient will undergo assessment of FGA and LTG to be set as appropriate Comments: patient not wanting to continue PT services  05/26/21 DEFERRED    LONG TERM GOALS:    LTG Name Target Date Goal status  1 Patient will improve DFS >/= 59 , and DPS >/= 55 Comments: DPS" 46, DFS: 55.5; DPS: 70, DFS: 62.2 06/16/21 MET  2 Patient will demonstrate (-) positional testing to indicate resolution of BPPV Comments: Negative Positional Testing 06/09/21 06/16/21 MET  3 LTG to be set for FGA as appropriate Comments: Not Assessed 06/16/21 DEFERRED  4 Patient will be independent with Vestibular/Balance HEP Comments: reports independence with Vestibular/Balance HEP 06/16/21 MET    PLAN: PT frequency: 1x/week   PT duration: other: 5 weeks   Planned  Interventions: Therapeutic exercises, Therapeutic activity, Neuro Muscular re-education, Balance training, Gait training, Patient/Family education, Joint mobilization, Vestibular training and Canalith repositioning   Plan for next session: D/C     Jones Bales, PT, DPT 06/09/2021, 3:46 PM    [facility] [address] [place], [place], [address] Phone: [phone]   Fax:  [phone]  Patient name: Carl Strong MRN: [id number] DOB: [birth year]

## 2021-06-09 ENCOUNTER — Other Ambulatory Visit: Payer: Self-pay

## 2021-06-09 ENCOUNTER — Ambulatory Visit: Payer: Medicare Other | Attending: Geriatric Medicine

## 2021-06-09 DIAGNOSIS — H8111 Benign paroxysmal vertigo, right ear: Secondary | ICD-10-CM | POA: Diagnosis not present

## 2021-06-09 DIAGNOSIS — R2681 Unsteadiness on feet: Secondary | ICD-10-CM

## 2021-06-09 DIAGNOSIS — R42 Dizziness and giddiness: Secondary | ICD-10-CM | POA: Diagnosis not present

## 2021-06-14 DIAGNOSIS — Z8546 Personal history of malignant neoplasm of prostate: Secondary | ICD-10-CM | POA: Diagnosis not present

## 2021-06-14 DIAGNOSIS — I129 Hypertensive chronic kidney disease with stage 1 through stage 4 chronic kidney disease, or unspecified chronic kidney disease: Secondary | ICD-10-CM | POA: Diagnosis not present

## 2021-06-14 DIAGNOSIS — E7800 Pure hypercholesterolemia, unspecified: Secondary | ICD-10-CM | POA: Diagnosis not present

## 2021-06-14 DIAGNOSIS — E1142 Type 2 diabetes mellitus with diabetic polyneuropathy: Secondary | ICD-10-CM | POA: Diagnosis not present

## 2021-06-15 ENCOUNTER — Ambulatory Visit: Payer: Medicare Other

## 2021-06-29 DIAGNOSIS — I10 Essential (primary) hypertension: Secondary | ICD-10-CM | POA: Diagnosis not present

## 2021-07-18 DIAGNOSIS — E1121 Type 2 diabetes mellitus with diabetic nephropathy: Secondary | ICD-10-CM | POA: Diagnosis not present

## 2021-07-18 DIAGNOSIS — I129 Hypertensive chronic kidney disease with stage 1 through stage 4 chronic kidney disease, or unspecified chronic kidney disease: Secondary | ICD-10-CM | POA: Diagnosis not present

## 2021-07-18 DIAGNOSIS — N1831 Chronic kidney disease, stage 3a: Secondary | ICD-10-CM | POA: Diagnosis not present

## 2021-07-27 DIAGNOSIS — I10 Essential (primary) hypertension: Secondary | ICD-10-CM | POA: Diagnosis not present

## 2021-09-06 DIAGNOSIS — E1142 Type 2 diabetes mellitus with diabetic polyneuropathy: Secondary | ICD-10-CM | POA: Diagnosis not present

## 2021-09-06 DIAGNOSIS — I129 Hypertensive chronic kidney disease with stage 1 through stage 4 chronic kidney disease, or unspecified chronic kidney disease: Secondary | ICD-10-CM | POA: Diagnosis not present

## 2021-09-06 DIAGNOSIS — E7800 Pure hypercholesterolemia, unspecified: Secondary | ICD-10-CM | POA: Diagnosis not present

## 2021-09-06 DIAGNOSIS — E1121 Type 2 diabetes mellitus with diabetic nephropathy: Secondary | ICD-10-CM | POA: Diagnosis not present

## 2021-10-30 DIAGNOSIS — I10 Essential (primary) hypertension: Secondary | ICD-10-CM | POA: Diagnosis not present

## 2021-11-06 DIAGNOSIS — R42 Dizziness and giddiness: Secondary | ICD-10-CM | POA: Diagnosis not present

## 2021-11-06 DIAGNOSIS — H903 Sensorineural hearing loss, bilateral: Secondary | ICD-10-CM | POA: Diagnosis not present

## 2021-11-08 DIAGNOSIS — R42 Dizziness and giddiness: Secondary | ICD-10-CM | POA: Diagnosis not present

## 2021-11-15 DIAGNOSIS — R42 Dizziness and giddiness: Secondary | ICD-10-CM | POA: Diagnosis not present

## 2021-11-15 DIAGNOSIS — H903 Sensorineural hearing loss, bilateral: Secondary | ICD-10-CM | POA: Diagnosis not present

## 2021-11-15 DIAGNOSIS — H832X3 Labyrinthine dysfunction, bilateral: Secondary | ICD-10-CM | POA: Diagnosis not present

## 2021-11-22 DIAGNOSIS — E1142 Type 2 diabetes mellitus with diabetic polyneuropathy: Secondary | ICD-10-CM | POA: Diagnosis not present

## 2021-11-22 DIAGNOSIS — R260 Ataxic gait: Secondary | ICD-10-CM | POA: Diagnosis not present

## 2021-11-22 DIAGNOSIS — I129 Hypertensive chronic kidney disease with stage 1 through stage 4 chronic kidney disease, or unspecified chronic kidney disease: Secondary | ICD-10-CM | POA: Diagnosis not present

## 2021-11-22 DIAGNOSIS — N1831 Chronic kidney disease, stage 3a: Secondary | ICD-10-CM | POA: Diagnosis not present

## 2021-11-29 DIAGNOSIS — I10 Essential (primary) hypertension: Secondary | ICD-10-CM | POA: Diagnosis not present

## 2021-12-12 ENCOUNTER — Other Ambulatory Visit: Payer: Self-pay

## 2021-12-12 ENCOUNTER — Encounter: Payer: Self-pay | Admitting: Physical Therapy

## 2021-12-12 ENCOUNTER — Ambulatory Visit: Payer: Medicare Other | Attending: Otolaryngology | Admitting: Physical Therapy

## 2021-12-12 DIAGNOSIS — H8112 Benign paroxysmal vertigo, left ear: Secondary | ICD-10-CM | POA: Insufficient documentation

## 2021-12-12 DIAGNOSIS — R2681 Unsteadiness on feet: Secondary | ICD-10-CM | POA: Insufficient documentation

## 2021-12-12 DIAGNOSIS — R2689 Other abnormalities of gait and mobility: Secondary | ICD-10-CM | POA: Diagnosis not present

## 2021-12-13 NOTE — Therapy (Signed)
[facility] [address] [place], [place], [address] Phone: [phone]   Fax:  [phone]  Physical Therapy Evaluation  Patient Details  Name: DIRON HADDON MRN: [id number] Date of Birth: November 20, 1929 Referring Provider (PT): Dr. Kasandra Knudsen Jonathon Bellows Teoh   Encounter Date: 12/12/2021   PT End of Session - 12/13/21 1615     Visit Number 1    Number of Visits 5   eval + 4 visits   Date for PT Re-Evaluation 01/12/22    Authorization Type BCBS Medicare    Authorization Time Period 12-12-21 - 02-12-22    PT Start Time 1445    PT Stop Time 1532    PT Time Calculation (min) 47 min    Activity Tolerance Patient tolerated treatment well    Behavior During Therapy [facility] for tasks assessed/performed             Past Medical History:  Diagnosis Date   Arthritis    "left hand" (01/08/2017)   GERD (gastroesophageal reflux disease)    High cholesterol    History of kidney stones    Hypertension    Osteopenia    Prostate cancer ([place]) 2005   Type II diabetes mellitus ([place])     Past Surgical History:  Procedure Laterality Date   CARPAL TUNNEL RELEASE Right 06/28/2016   Procedure: RIGHT CARPAL TUNNEL RELEASE;  Surgeon: Daryll Brod, MD;  Location: [place];  Service: Orthopedics;  Laterality: Right;  ANESTHESIA:  IV REGIONAL FAB   CYSTOSCOPY W/ STONE MANIPULATION     LAPAROSCOPIC CHOLECYSTECTOMY     PROSTATECTOMY  04/2004   Archie Endo 05/16/2011   TONSILLECTOMY     URETHRAL STRICTURE DILATATION  07/2005   Archie Endo 05/16/2011    There were no vitals filed for this visit.    Subjective Assessment - 12/12/21 1444     Subjective Pt reports he gets very light-headed and off balance when he transfers from sitting to standing; states this has been going on for about 3 months weeks but has gotten worse within past week or past few days.  Pt states the dizziness/light-headedness is worse in the morning but gets some better in the  pm    Patient Stated Goals Improve balance    Currently in Pain? No/denies                [facility] PT Assessment - 12/13/21 0001       Assessment   Medical Diagnosis Dizziness    Referring Provider (PT) Dr. Kasandra Knudsen Jonathon Bellows Teoh    Onset Date/Surgical Date --   approx. 3 months ago (Sept. 2022)   Prior Therapy June 2022 last visit seen at this facility      Precautions   Precautions Fall      Balance Screen   Has the patient fallen in the past 6 months No    Has the patient had a decrease in activity level because of a fear of falling?  No    Is the patient reluctant to leave their home because of a fear of falling?  No      Prior Function   Level of Independence Independent      Observation/Other Assessments   Focus on Therapeutic Outcomes (FOTO)  DPS 39; risk adjusted 51/100                    Vestibular Assessment - 12/13/21 0001       Vestibular Assessment   General  Observation Pt is a 87 yr old gentleman with c/o dizziness and imbalance that started approx. 3 months ago; states it has recently gotten worse      Symptom Behavior   Type of Dizziness  Imbalance;Unsteady with head/body turns;Lightheadedness   diplopia is intermittent - occurs while watching TV   Frequency of Dizziness daily    Duration of Dizziness lasts most of the am    Symptom Nature Variable;Motion provoked    Aggravating Factors Sit to stand;Supine to sit;Activity in general;Forward bending;Looking up to the ceiling   looking up to ceiling in standing aggravates - not in seated position   Relieving Factors Slow movements    Progression of Symptoms Worse    History of similar episodes Pt was treated at this facility in May - June 2022 for Rt BPPV      Oculomotor Exam   Oculomotor Alignment Normal      Positional Testing   Dix-Hallpike Dix-Hallpike Right;Dix-Hallpike Left    Sidelying Test Sidelying Right;Sidelying Left      Dix-Hallpike Right   Dix-Hallpike Right Duration none     Dix-Hallpike Right Symptoms No nystagmus      Dix-Hallpike Left   Dix-Hallpike Left Duration approx. 5 secs    Dix-Hallpike Left Symptoms Upbeat, left rotatory nystagmus      Sidelying Right   Sidelying Right Duration none    Sidelying Right Symptoms No nystagmus      Sidelying Left   Sidelying Left Duration none in sidelying but mod. c/o dizziness with return to sitting    Sidelying Left Symptoms No nystagmus                Objective measurements completed on examination: See above findings.        Vestibular Treatment/Exercise - 12/13/21 0001       Vestibular Treatment/Exercise   Vestibular Treatment Provided Canalith Repositioning    Canalith Repositioning Epley Manuever Left       EPLEY MANUEVER LEFT   Number of Reps  3    Overall Response  Improved Symptoms     RESPONSE DETAILS LEFT pt had mild to mod. c/o dizziness with Lt sidelying to sitting on all 3 reps                    PT Education - 12/13/21 1614     Education Details reviewed Laruth Bouchard- Daroff exercises as previously instructed (pt declined another handout)    Person(s) Educated Patient    Methods Explanation;Demonstration    Comprehension Verbalized understanding;Returned demonstration                 PT Long Term Goals - 12/13/21 1622       PT LONG TERM GOAL #1   Title The patient will be indep with HEP for habituation and balance exercises.    Period Weeks    Status New    Target Date 01/12/22      PT LONG TERM GOAL #2   Title Pt will have (-) Lt Dix-Hallpike test with no c/o vertigo and no nystagmus to indicate resolution of Lt BPPV.    Time 4    Period Weeks    Status New    Target Date 01/12/22      PT LONG TERM GOAL #3   Title Pt will report at least 50% improvement in dizziness and balance.    Time 4    Period Weeks    Status New    Target Date  01/12/22      PT LONG TERM GOAL #4   Title Increase FOTO DPS score from 39 to >/= 49/100 to demo improvement in  dizziness.    Time 4    Period Weeks    Status New    Target Date 01/12/22                    Plan - 12/13/21 1617     Clinical Impression Statement Pt had (+) Lt Dix-Hallpike test with Lt rotary, upbeating nystagmus indicative of Lt BPPV posterior canalithiasis.  Epley maneuver was performed x 3 reps and symptoms were much improved with no nystagmus noted on the 3rd rep.  Pt will benefit from PT to continue to assess for Lt BPPV and treat prn.    Personal Factors and Comorbidities Age;Past/Current Experience;Comorbidity 2    Comorbidities h/o BPPV, type 2 DM, HTN, osteopenia, GERD, arthritis    Examination-Activity Limitations Locomotion Level;Transfers;Bed Mobility;Bend;Reach Overhead;Squat;Stairs    Examination-Participation Restrictions Cleaning;Community Activity;Yard Work;Shop;Laundry;Meal Prep    Stability/Clinical Decision Making Evolving/Moderate complexity    Clinical Decision Making Moderate    Rehab Potential Good    PT Frequency 1x / week    PT Duration 4 weeks    PT Treatment/Interventions ADLs/Self Care Home Management;Canalith Repostioning;Gait training;Stair training;Therapeutic activities;Therapeutic exercise;Balance training;Neuromuscular re-education;Patient/family education;Vestibular    PT Next Visit Plan recheck Lt BPPV and treat prn - balance HEP?    PT Home Exercise Plan Laruth Bouchard- Daroff exs    Consulted and Agree with Plan of Care Patient             Patient will benefit from skilled therapeutic intervention in order to improve the following deficits and impairments:  Dizziness, Difficulty walking, Decreased balance  Visit Diagnosis: BPPV (benign paroxysmal positional vertigo), left - Plan: PT plan of care cert/re-cert  Other abnormalities of gait and mobility - Plan: PT plan of care cert/re-cert  Unsteadiness on feet - Plan: PT plan of care cert/re-cert     Problem List Patient Active Problem List   Diagnosis Date Noted   Acute  encephalopathy 01/06/2017   Hypoglycemia 01/06/2017   Hypertension    GERD (gastroesophageal reflux disease)    Diabetes mellitus without complication ([place])     Arabia Nylund, [place], PT 12/13/2021, 4:30 PM  [facility] [address] [place], [place], [address] Phone: [phone]   Fax:  [phone]  Name: MELECIO CUETO MRN: [id number] Date of Birth: Nov 08, 1929

## 2021-12-15 DIAGNOSIS — E1121 Type 2 diabetes mellitus with diabetic nephropathy: Secondary | ICD-10-CM | POA: Diagnosis not present

## 2021-12-15 DIAGNOSIS — E1142 Type 2 diabetes mellitus with diabetic polyneuropathy: Secondary | ICD-10-CM | POA: Diagnosis not present

## 2021-12-15 DIAGNOSIS — E7800 Pure hypercholesterolemia, unspecified: Secondary | ICD-10-CM | POA: Diagnosis not present

## 2021-12-15 DIAGNOSIS — I10 Essential (primary) hypertension: Secondary | ICD-10-CM | POA: Diagnosis not present

## 2021-12-29 DIAGNOSIS — I10 Essential (primary) hypertension: Secondary | ICD-10-CM | POA: Diagnosis not present

## 2022-01-09 ENCOUNTER — Ambulatory Visit: Payer: Medicare Other | Admitting: Physical Therapy

## 2022-01-30 DIAGNOSIS — I10 Essential (primary) hypertension: Secondary | ICD-10-CM | POA: Diagnosis not present

## 2022-02-13 DIAGNOSIS — H2513 Age-related nuclear cataract, bilateral: Secondary | ICD-10-CM | POA: Diagnosis not present

## 2022-02-13 DIAGNOSIS — H532 Diplopia: Secondary | ICD-10-CM | POA: Diagnosis not present

## 2022-02-13 DIAGNOSIS — H5032 Intermittent alternating esotropia: Secondary | ICD-10-CM | POA: Diagnosis not present

## 2022-02-13 DIAGNOSIS — H40013 Open angle with borderline findings, low risk, bilateral: Secondary | ICD-10-CM | POA: Diagnosis not present

## 2022-02-27 DIAGNOSIS — I10 Essential (primary) hypertension: Secondary | ICD-10-CM | POA: Diagnosis not present

## 2022-03-30 DIAGNOSIS — I129 Hypertensive chronic kidney disease with stage 1 through stage 4 chronic kidney disease, or unspecified chronic kidney disease: Secondary | ICD-10-CM | POA: Diagnosis not present

## 2022-04-04 DIAGNOSIS — Z1389 Encounter for screening for other disorder: Secondary | ICD-10-CM | POA: Diagnosis not present

## 2022-04-04 DIAGNOSIS — I129 Hypertensive chronic kidney disease with stage 1 through stage 4 chronic kidney disease, or unspecified chronic kidney disease: Secondary | ICD-10-CM | POA: Diagnosis not present

## 2022-04-04 DIAGNOSIS — E1142 Type 2 diabetes mellitus with diabetic polyneuropathy: Secondary | ICD-10-CM | POA: Diagnosis not present

## 2022-04-04 DIAGNOSIS — E7800 Pure hypercholesterolemia, unspecified: Secondary | ICD-10-CM | POA: Diagnosis not present

## 2022-04-04 DIAGNOSIS — Z0000 Encounter for general adult medical examination without abnormal findings: Secondary | ICD-10-CM | POA: Diagnosis not present

## 2022-04-04 DIAGNOSIS — I700 Atherosclerosis of aorta: Secondary | ICD-10-CM | POA: Diagnosis not present

## 2022-04-04 DIAGNOSIS — Z79899 Other long term (current) drug therapy: Secondary | ICD-10-CM | POA: Diagnosis not present

## 2022-04-05 DIAGNOSIS — H5703 Miosis: Secondary | ICD-10-CM | POA: Diagnosis not present

## 2022-04-05 DIAGNOSIS — H2512 Age-related nuclear cataract, left eye: Secondary | ICD-10-CM | POA: Diagnosis not present

## 2022-04-29 DIAGNOSIS — I129 Hypertensive chronic kidney disease with stage 1 through stage 4 chronic kidney disease, or unspecified chronic kidney disease: Secondary | ICD-10-CM | POA: Diagnosis not present

## 2022-04-29 DIAGNOSIS — I10 Essential (primary) hypertension: Secondary | ICD-10-CM | POA: Diagnosis not present

## 2022-05-22 DIAGNOSIS — I4891 Unspecified atrial fibrillation: Secondary | ICD-10-CM | POA: Diagnosis not present

## 2022-05-22 DIAGNOSIS — I499 Cardiac arrhythmia, unspecified: Secondary | ICD-10-CM | POA: Diagnosis not present

## 2022-05-22 DIAGNOSIS — R55 Syncope and collapse: Secondary | ICD-10-CM | POA: Diagnosis not present

## 2022-05-23 ENCOUNTER — Ambulatory Visit (INDEPENDENT_AMBULATORY_CARE_PROVIDER_SITE_OTHER): Payer: Medicare Other

## 2022-05-23 ENCOUNTER — Encounter: Payer: Self-pay | Admitting: Cardiovascular Disease

## 2022-05-23 ENCOUNTER — Ambulatory Visit: Payer: Medicare Other | Admitting: Cardiovascular Disease

## 2022-05-23 DIAGNOSIS — E782 Mixed hyperlipidemia: Secondary | ICD-10-CM

## 2022-05-23 DIAGNOSIS — I480 Paroxysmal atrial fibrillation: Secondary | ICD-10-CM

## 2022-05-23 DIAGNOSIS — I10 Essential (primary) hypertension: Secondary | ICD-10-CM | POA: Diagnosis not present

## 2022-05-23 DIAGNOSIS — E785 Hyperlipidemia, unspecified: Secondary | ICD-10-CM | POA: Insufficient documentation

## 2022-05-23 MED ORDER — APIXABAN 5 MG PO TABS: 5.0000 mg | ORAL_TABLET | Freq: Two times a day (BID) | ORAL | 0 refills | Status: DC

## 2022-05-23 MED ORDER — APIXABAN 5 MG PO TABS: 5.0000 mg | ORAL_TABLET | Freq: Two times a day (BID) | ORAL | 3 refills | Status: DC

## 2022-05-23 NOTE — Assessment & Plan Note (Signed)
History of essential hypertension a blood pressure measured today at 130/60.  He is on carvedilol, lisinopril and hydrochlorothiazide.

## 2022-05-23 NOTE — Assessment & Plan Note (Signed)
Newly recognized atrial fibrillation accompanied by dizziness.  Is initial ventricular sponsor was about 120.  His amlodipine was discontinued and carvedilol was started.  His heart rate is now in the 80s.  He does feel a little bit lightheaded.  I am going to begin him on Eliquis, get a 2D echo and a TSH and will arrange for him to have outpatient cardioversion in the next in the next 3 to 4 weeks.

## 2022-05-23 NOTE — Progress Notes (Signed)
05/23/2022 Carl Strong   June 29, 1929  [id number]  Primary Physician Lajean Manes, MD Primary Cardiologist: Lorretta Harp MD Lupe Carney, [place]  HPI:  Carl Strong is a 88 y.o. thin-appearing widowed Caucasian male father of 2 referred by Marcia Brash PA at Dr. Carlyle Lipa office because of newly recognized A-fib.  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  He smoked remotely.  He does have a history of hypertension, diabetes and hyperlipidemia.  Is never had a heart attack or stroke.  He denies chest pain or shortness of breath.  He lives alone with his cat and does yard work.  He was noted to be in new onset A-fib with rapid ventricular response yesterday and his amlodipine was changed to carvedilol for rate control.  He still feels somewhat lightheaded.  Given the recent onset I think he would be an appropriate patient to perform outpatient DC cardioversion.  I am going to begin him on Eliquis oral anticoagulation.   Current Meds  Medication Sig   acetaminophen (TYLENOL) 325 MG tablet Take 2 tablets (650 mg total) by mouth every 6 (six) hours as needed for mild pain (or Fever >/= 101).   aspirin 81 MG tablet Take 81 mg by mouth daily.   atenolol (TENORMIN) 25 MG tablet Take 25 mg by mouth daily.    carvedilol (COREG) 6.25 MG tablet 1 tablet with food   feeding supplement, GLUCERNA SHAKE, (GLUCERNA SHAKE) LIQD Take 237 mLs by mouth 2 (two) times daily between meals.   fosinopril (MONOPRIL) 20 MG tablet TAKE 1 BY MOUTH TWICE DAILY   glimepiride (AMARYL) 4 MG tablet 1 table   glucose blood (ONETOUCH ULTRA) test strip USE 1 STRIP TO CHECK GLUCOSE ONCE DAILY Dx: E11.42   hydrochlorothiazide (HYDRODIURIL) 25 MG tablet Take 1 tablet by mouth every morning.   levofloxacin (LEVAQUIN) 750 MG tablet Take 1 tablet (750 mg total) by mouth daily.   lisinopril (PRINIVIL,ZESTRIL) 20 MG tablet Take 20 mg by mouth 2 (two) times daily.   metFORMIN (GLUCOPHAGE) 1000 MG tablet  Take 1,000 mg by mouth 2 (two) times daily with a meal.   pravastatin (PRAVACHOL) 10 MG tablet Take 10 mg by mouth daily.     No Known Allergies  Social History   Socioeconomic History   Marital status: Widowed    Spouse name: Not on file   Number of children: Not on file   Years of education: Not on file   Highest education level: Not on file  Occupational History   Not on file  Tobacco Use   Smoking status: Former    Packs/day: 2.00    Years: 43.00    Pack years: 86.00    Types: Cigarettes    Quit date: 02/13/1991    Years since quitting: 31.2   Smokeless tobacco: Never  Substance and Sexual Activity   Alcohol use: No   Drug use: No   Sexual activity: Not on file  Other Topics Concern   Not on file  Social History Narrative   Not on file   Social Determinants of Health   Financial Resource Strain: Not on file  Food Insecurity: Not on file  Transportation Needs: Not on file  Physical Activity: Not on file  Stress: Not on file  Social Connections: Not on file  Intimate Partner Violence: Not on file     Review of Systems: General: negative for chills, fever, night sweats or weight changes.  Cardiovascular: negative for  chest pain, dyspnea on exertion, edema, orthopnea, palpitations, paroxysmal nocturnal dyspnea or shortness of breath Dermatological: negative for rash Respiratory: negative for cough or wheezing Urologic: negative for hematuria Abdominal: negative for nausea, vomiting, diarrhea, bright red blood per rectum, melena, or hematemesis Neurologic: negative for visual changes, syncope, or dizziness All other systems reviewed and are otherwise negative except as noted above.    Blood pressure 130/60, pulse 73, height 6' (1.829 m), weight 173 lb 3.2 oz (78.6 kg), SpO2 98 %.  General appearance: alert and no distress Neck: no adenopathy, no carotid bruit, no JVD, supple, symmetrical, trachea midline, and thyroid not enlarged, symmetric, no  tenderness/mass/nodules Lungs: clear to auscultation bilaterally Heart: irregularly irregular rhythm Extremities: extremities normal, atraumatic, no cyanosis or edema Pulses: 2+ and symmetric Skin: Skin color, texture, turgor normal. No rashes or lesions  EKG atrial fibrillation with ventricular sponsor of 80.  I personally reviewed this EKG.  ASSESSMENT AND PLAN:   Hypertension History of essential hypertension a blood pressure measured today at 130/60.  He is on carvedilol, lisinopril and hydrochlorothiazide.  Hyperlipidemia History of hyperlipidemia on pravastatin with lipid profile performed/ 04/04/22 revealing total cholesterol 120, LDL 76 and HDL 35.  Paroxysmal atrial fibrillation (H[facility]) Newly recognized atrial fibrillation accompanied by dizziness.  Is initial ventricular sponsor was about 120.  His amlodipine was discontinued and carvedilol was started.  His heart rate is now in the 80s.  He does feel a little bit lightheaded.  I am going to begin him on Eliquis, get a 2D echo and a TSH and will arrange for him to have outpatient cardioversion in the next in the next 3 to 4 weeks.     Lorretta Harp MD FACP,FACC,FAHA, [facility] 05/23/2022 10:58 AM

## 2022-05-23 NOTE — Progress Notes (Unsigned)
Enrolled patient for a 14 day Zio XT  monitor to be mailed to patients home  °

## 2022-05-23 NOTE — Patient Instructions (Signed)
Medication Instructions:   -Start apixaban (eliquis) '5mg'$  twice daily.  *If you need a refill on your cardiac medications before your next appointment, please call your pharmacy*    Testing/Procedures: Your physician has requested that you have an echocardiogram. Echocardiography is a painless test that uses sound waves to create images of your heart. It provides your doctor with information about the size and shape of your heart and how well your heart's chambers and valves are working. This procedure takes approximately one hour. There are no restrictions for this procedure. This procedure will be done at [address] Monitor Instructions  Your physician has requested you wear a ZIO patch monitor for 14 days.  This is a single patch monitor. [employer] supplies one patch monitor per enrollment. Additional stickers are not available. Please do not apply patch if you will be having a Nuclear Stress Test,  Echocardiogram, Cardiac CT, MRI, or Chest Xray during the period you would be wearing the  monitor. The patch cannot be worn during these tests. You cannot remove and re-apply the  ZIO XT patch monitor.  Your ZIO patch monitor will be mailed 3 day USPS to your address on file. It may take 3-5 days  to receive your monitor after you have been enrolled.  Once you have received your monitor, please review the enclosed instructions. Your monitor  has already been registered assigning a specific monitor serial # to you.  Billing and Patient Assistance Program Information  We have supplied [employer] with any of your insurance information on file for billing purposes. [employer] offers a sliding scale Patient Assistance Program for patients that do not have  insurance, or whose insurance does not completely cover the cost of the ZIO monitor.  You must apply for the Patient Assistance Program to qualify for this discounted rate.  To apply, please call [employer] at  [phone], select option 4, select option 2, ask to apply for  Patient Assistance Program. Theodore Demark will ask your household income, and how many people  are in your household. They will quote your out-of-pocket cost based on that information.  [employer] will also be able to set up a 51-month interest-free payment plan if needed.  Applying the monitor   Shave hair from upper left chest.  Hold abrader disc by orange tab. Rub abrader in 40 strokes over the upper left chest as  indicated in your monitor instructions.  Clean area with 4 enclosed alcohol pads. Let dry.  Apply patch as indicated in monitor instructions. Patch will be placed under collarbone on left  side of chest with arrow pointing upward.  Rub patch adhesive wings for 2 minutes. Remove white label marked "1". Remove the white  label marked "2". Rub patch adhesive wings for 2 additional minutes.  While looking in a mirror, press and release button in center of patch. A small green light will  flash 3-4 times. This will be your only indicator that the monitor has been turned on.  Do not shower for the first 24 hours. You may shower after the first 24 hours.  Press the button if you feel a symptom. You will hear a small click. Record Date, Time and  Symptom in the Patient Logbook.  When you are ready to remove the patch, follow instructions on the last 2 pages of Patient  Logbook. Stick patch monitor onto the last page of Patient Logbook.  Place Patient Logbook in the blue and  white box. Use locking tab on box and tape box closed  securely. The blue and white box has prepaid postage on it. Please place it in the mailbox as  soon as possible. Your physician should have your test results approximately 7 days after the  monitor has been mailed back to [facility].  Call [facility] at [phone] if you have questions regarding  your ZIO XT patch monitor. Call them immediately if you see an orange light  blinking on your  monitor.  If your monitor falls off in less than 4 days, contact our Monitor department at [phone].  If your monitor becomes loose or falls off after 4 days call [employer] at [phone] for  suggestions on securing your monitor    Follow-Up: At [facility], you and your health needs are our priority.  As part of our continuing mission to provide you with exceptional heart care, we have created designated Provider Care Teams.  These Care Teams include your primary Cardiologist (physician) and Advanced Practice Providers (APPs -  Physician Assistants and Nurse Practitioners) who all work together to provide you with the care you need, when you need it.  We recommend signing up for the patient portal called "MyChart".  Sign up information is provided on this After Visit Summary.  MyChart is used to connect with patients for Virtual Visits (Telemedicine).  Patients are able to view lab/test results, encounter notes, upcoming appointments, etc.  Non-urgent messages can be sent to your provider as well.   To learn more about what you can do with MyChart, go to [email or web address].    Your next appointment:   4 week(s)  The format for your next appointment:   In Person  Provider:   Quay Burow, MD

## 2022-05-23 NOTE — Assessment & Plan Note (Signed)
History of hyperlipidemia on pravastatin with lipid profile performed/ 04/04/22 revealing total cholesterol 120, LDL 76 and HDL 35.

## 2022-05-25 DIAGNOSIS — E782 Mixed hyperlipidemia: Secondary | ICD-10-CM

## 2022-05-25 DIAGNOSIS — I480 Paroxysmal atrial fibrillation: Secondary | ICD-10-CM | POA: Diagnosis not present

## 2022-05-25 DIAGNOSIS — I10 Essential (primary) hypertension: Secondary | ICD-10-CM

## 2022-05-30 DIAGNOSIS — I129 Hypertensive chronic kidney disease with stage 1 through stage 4 chronic kidney disease, or unspecified chronic kidney disease: Secondary | ICD-10-CM | POA: Diagnosis not present

## 2022-05-30 DIAGNOSIS — E876 Hypokalemia: Secondary | ICD-10-CM | POA: Diagnosis not present

## 2022-06-03 ENCOUNTER — Observation Stay (HOSPITAL_COMMUNITY)
Admission: EM | Admit: 2022-06-03 | Discharge: 2022-06-05 | Disposition: A | Discharge status: Home / Self Care | Payer: Medicare Other | Attending: Internal Medicine | Admitting: Internal Medicine

## 2022-06-03 ENCOUNTER — Emergency Department (HOSPITAL_COMMUNITY): Payer: Medicare Other

## 2022-06-03 ENCOUNTER — Encounter (HOSPITAL_COMMUNITY): Payer: Self-pay | Admitting: Internal Medicine

## 2022-06-03 ENCOUNTER — Other Ambulatory Visit: Payer: Self-pay

## 2022-06-03 DIAGNOSIS — R531 Weakness: Secondary | ICD-10-CM | POA: Insufficient documentation

## 2022-06-03 DIAGNOSIS — I10 Essential (primary) hypertension: Secondary | ICD-10-CM | POA: Diagnosis present

## 2022-06-03 DIAGNOSIS — Z66 Do not resuscitate: Secondary | ICD-10-CM | POA: Insufficient documentation

## 2022-06-03 DIAGNOSIS — Z7901 Long term (current) use of anticoagulants: Secondary | ICD-10-CM | POA: Insufficient documentation

## 2022-06-03 DIAGNOSIS — R42 Dizziness and giddiness: Principal | ICD-10-CM | POA: Insufficient documentation

## 2022-06-03 DIAGNOSIS — Z8546 Personal history of malignant neoplasm of prostate: Secondary | ICD-10-CM | POA: Diagnosis not present

## 2022-06-03 DIAGNOSIS — I119 Hypertensive heart disease without heart failure: Secondary | ICD-10-CM | POA: Diagnosis not present

## 2022-06-03 DIAGNOSIS — E876 Hypokalemia: Secondary | ICD-10-CM | POA: Insufficient documentation

## 2022-06-03 DIAGNOSIS — Z7984 Long term (current) use of oral hypoglycemic drugs: Secondary | ICD-10-CM | POA: Diagnosis not present

## 2022-06-03 DIAGNOSIS — Z7982 Long term (current) use of aspirin: Secondary | ICD-10-CM | POA: Insufficient documentation

## 2022-06-03 DIAGNOSIS — E11649 Type 2 diabetes mellitus with hypoglycemia without coma: Secondary | ICD-10-CM | POA: Insufficient documentation

## 2022-06-03 DIAGNOSIS — N179 Acute kidney failure, unspecified: Secondary | ICD-10-CM | POA: Diagnosis not present

## 2022-06-03 DIAGNOSIS — E440 Moderate protein-calorie malnutrition: Secondary | ICD-10-CM | POA: Diagnosis not present

## 2022-06-03 DIAGNOSIS — Z79899 Other long term (current) drug therapy: Secondary | ICD-10-CM | POA: Diagnosis not present

## 2022-06-03 DIAGNOSIS — E119 Type 2 diabetes mellitus without complications: Secondary | ICD-10-CM

## 2022-06-03 DIAGNOSIS — Z87891 Personal history of nicotine dependence: Secondary | ICD-10-CM | POA: Diagnosis not present

## 2022-06-03 DIAGNOSIS — I4891 Unspecified atrial fibrillation: Secondary | ICD-10-CM | POA: Diagnosis not present

## 2022-06-03 DIAGNOSIS — E785 Hyperlipidemia, unspecified: Secondary | ICD-10-CM | POA: Diagnosis present

## 2022-06-03 DIAGNOSIS — I480 Paroxysmal atrial fibrillation: Secondary | ICD-10-CM | POA: Diagnosis not present

## 2022-06-03 DIAGNOSIS — R197 Diarrhea, unspecified: Secondary | ICD-10-CM | POA: Diagnosis not present

## 2022-06-03 HISTORY — DX: Paroxysmal atrial fibrillation: I48.0

## 2022-06-03 LAB — BASIC METABOLIC PANEL
Anion gap: 12 (ref 5–15)
BUN: 16 mg/dL (ref 8–23)
CO2: 29 mmol/L (ref 22–32)
Calcium: 8.3 mg/dL — ABNORMAL LOW (ref 8.9–10.3)
Chloride: 94 mmol/L — ABNORMAL LOW (ref 98–111)
Creatinine, Ser: 1.5 mg/dL — ABNORMAL HIGH (ref 0.61–1.24)
GFR, Estimated: 44 mL/min — ABNORMAL LOW (ref >60)
Glucose, Bld: 116 mg/dL — ABNORMAL HIGH (ref 70–99)
Potassium: 2.4 mmol/L — CL (ref 3.5–5.1)
Sodium: 135 mmol/L (ref 135–145)

## 2022-06-03 LAB — APTT: aPTT: 30 seconds (ref 24–36)

## 2022-06-03 LAB — MAGNESIUM: Magnesium: 2.1 mg/dL (ref 1.7–2.4)

## 2022-06-03 LAB — CBC WITH DIFFERENTIAL/PLATELET
Abs Immature Granulocytes: 0.04 10*3/uL (ref 0.00–0.07)
Basophils Absolute: 0 10*3/uL (ref 0.0–0.1)
Basophils Relative: 1 %
Eosinophils Absolute: 0 10*3/uL (ref 0.0–0.5)
Eosinophils Relative: 0 %
HCT: 37.1 % — ABNORMAL LOW (ref 39.0–52.0)
Hemoglobin: 12.9 g/dL — ABNORMAL LOW (ref 13.0–17.0)
Immature Granulocytes: 1 %
Lymphocytes Relative: 14 %
Lymphs Abs: 0.6 10*3/uL — ABNORMAL LOW (ref 0.7–4.0)
MCH: 29.7 pg (ref 26.0–34.0)
MCHC: 34.8 g/dL (ref 30.0–36.0)
MCV: 85.3 fL (ref 80.0–100.0)
Monocytes Absolute: 0.4 10*3/uL (ref 0.1–1.0)
Monocytes Relative: 9 %
Neutro Abs: 3.3 10*3/uL (ref 1.7–7.7)
Neutrophils Relative %: 75 %
Platelets: 298 10*3/uL (ref 150–400)
RBC: 4.35 MIL/uL (ref 4.22–5.81)
RDW: 12.9 % (ref 11.5–15.5)
WBC: 4.4 10*3/uL (ref 4.0–10.5)
nRBC: 0 % (ref 0.0–0.2)

## 2022-06-03 LAB — CBG MONITORING, ED
Glucose-Capillary: 114 mg/dL — ABNORMAL HIGH (ref 70–99)
Glucose-Capillary: 131 mg/dL — ABNORMAL HIGH (ref 70–99)

## 2022-06-03 LAB — TROPONIN I (HIGH SENSITIVITY): Troponin I (High Sensitivity): 46 ng/L — ABNORMAL HIGH (ref <18)

## 2022-06-03 LAB — HEPARIN LEVEL (UNFRACTIONATED): Heparin Unfractionated: >1.1 IU/mL — ABNORMAL HIGH (ref 0.30–0.70)

## 2022-06-03 MED ORDER — POTASSIUM CHLORIDE 10 MEQ/100ML IV SOLN
10.0000 meq | INTRAVENOUS | Status: AC
  Administered 2022-06-03 (×5): 10 meq via INTRAVENOUS
  Filled 2022-06-03 (×5): qty 100

## 2022-06-03 MED ORDER — LACTATED RINGERS IV SOLN
INTRAVENOUS | Status: DC
Start: 2022-06-03 — End: 2022-06-04

## 2022-06-03 MED ORDER — POTASSIUM CHLORIDE 20 MEQ PO PACK
20.0000 meq | PACK | Freq: Every day | ORAL | Status: DC
  Administered 2022-06-03: 20 meq via ORAL
  Filled 2022-06-03: qty 1

## 2022-06-03 MED ORDER — ONDANSETRON HCL 4 MG/2ML IJ SOLN: 4.0000 mg | Freq: Four times a day (QID) | INTRAMUSCULAR | Status: DC | PRN

## 2022-06-03 MED ORDER — MECLIZINE HCL 25 MG PO TABS
25.0000 mg | ORAL_TABLET | Freq: Once | ORAL | Status: AC
  Administered 2022-06-03: 25 mg via ORAL
  Filled 2022-06-03: qty 1

## 2022-06-03 MED ORDER — ACETAMINOPHEN 325 MG PO TABS: 650.0000 mg | ORAL_TABLET | Freq: Four times a day (QID) | ORAL | Status: DC | PRN

## 2022-06-03 MED ORDER — SODIUM CHLORIDE 0.9 % IV BOLUS
1000.0000 mL | Freq: Once | INTRAVENOUS | Status: AC
  Administered 2022-06-03: 1000 mL via INTRAVENOUS

## 2022-06-03 MED ORDER — MAGNESIUM OXIDE -MG SUPPLEMENT 400 (240 MG) MG PO TABS
800.0000 mg | ORAL_TABLET | Freq: Once | ORAL | Status: AC
Start: 2022-06-03 — End: 2022-06-03
  Administered 2022-06-03: 800 mg via ORAL
  Filled 2022-06-03: qty 2

## 2022-06-03 MED ORDER — HYDRALAZINE HCL 20 MG/ML IJ SOLN: 5.0000 mg | INTRAMUSCULAR | Status: DC | PRN

## 2022-06-03 MED ORDER — CARVEDILOL 3.125 MG PO TABS
6.2500 mg | ORAL_TABLET | Freq: Two times a day (BID) | ORAL | Status: DC
Start: 2022-06-03 — End: 2022-06-04
  Administered 2022-06-03 – 2022-06-04 (×2): 6.25 mg via ORAL
  Filled 2022-06-03 (×2): qty 2

## 2022-06-03 MED ORDER — POTASSIUM CHLORIDE CRYS ER 20 MEQ PO TBCR
40.0000 meq | EXTENDED_RELEASE_TABLET | Freq: Once | ORAL | Status: AC
  Administered 2022-06-03: 40 meq via ORAL
  Filled 2022-06-03: qty 2

## 2022-06-03 MED ORDER — HEPARIN (PORCINE) 25000 UT/250ML-% IV SOLN
1100.0000 [IU]/h | INTRAVENOUS | Status: DC
  Administered 2022-06-03: 1100 [IU]/h via INTRAVENOUS
  Filled 2022-06-03: qty 250

## 2022-06-03 MED ORDER — ACETAMINOPHEN 650 MG RE SUPP: 650.0000 mg | Freq: Four times a day (QID) | RECTAL | Status: DC | PRN

## 2022-06-03 MED ORDER — LOPERAMIDE HCL 2 MG PO CAPS
4.0000 mg | ORAL_CAPSULE | ORAL | Status: DC | PRN
  Administered 2022-06-04 – 2022-06-05 (×3): 4 mg via ORAL
  Filled 2022-06-03 (×3): qty 2

## 2022-06-03 MED ORDER — PRAVASTATIN SODIUM 10 MG PO TABS
10.0000 mg | ORAL_TABLET | Freq: Every day | ORAL | Status: DC
  Administered 2022-06-04 – 2022-06-05 (×2): 10 mg via ORAL
  Filled 2022-06-03 (×2): qty 1

## 2022-06-03 MED ORDER — SODIUM CHLORIDE 0.9% FLUSH
3.0000 mL | Freq: Two times a day (BID) | INTRAVENOUS | Status: DC
  Administered 2022-06-04 – 2022-06-05 (×2): 3 mL via INTRAVENOUS

## 2022-06-03 MED ORDER — INSULIN ASPART 100 UNIT/ML IJ SOLN: 0.0000 [IU] | Freq: Three times a day (TID) | INTRAMUSCULAR | Status: DC

## 2022-06-03 MED ORDER — ONDANSETRON HCL 4 MG PO TABS: 4.0000 mg | ORAL_TABLET | Freq: Four times a day (QID) | ORAL | Status: DC | PRN

## 2022-06-03 NOTE — H&P (Signed)
History and Physical    Patient: Carl Strong YQM:[id number] DOB: [birth year] DOA: 06/03/2022 DOS: the patient was seen and examined on 06/03/2022 PCP: Lajean Manes, MD  Patient coming from: Home - lives alone; [place], [place]   Chief Complaint: Dizziness  HPI: Carl Strong is a 88 y.o. male with medical history significant of HLD, HTN, DM, afib, and remote prostate CA presenting with nausea and dizziness.  He reports that he has a remote h/o vertigo but has been doing well.  However, he recently was diagnosed with afib and started on Eliquis and he thinks is causing him to feel terrible.  He has been light-headed and having diarrhea.  He would like to change off this medication.    Per Dr. Kennon Holter 5/24 note, he was recently diagnosed with afib with RVR.  Amlodipine was changed to carvedilol for rate control.  He has been light-headed.  Per Dr. Kennon Holter note, he is supposed to have DCCV in 3-4 weeks.  He was light-headed prior to the onset of Eliquis per Dr. Kennon Holter note, although the patient attributes his symptoms to Eliquis.    I spoke with his step-son.  He is pretty convinced it is the medications.  He was scheduled for echo on 6/16.  His son is ok with having it done here and maybe get the DCCV done as well.  They are scheduled to see Dr. Gwenlyn Found on 6/22.  They would be open to transitioning to Coumadin.   ER Course:  Thought Eliquis was making him light-headed with standing x 1 week.  Also with BP meds changed recently.  K+ is 2.4.  Suggest observation overnight.     Review of Systems: As mentioned in the history of present illness. All other systems reviewed and are negative. Past Medical History:  Diagnosis Date   Arthritis    "left hand" (01/08/2017)   GERD (gastroesophageal reflux disease)    High cholesterol    History of kidney stones    Hypertension    Osteopenia    PAF (paroxysmal atrial fibrillation) ([place])    Prostate cancer ([place]) 2005   Type  II diabetes mellitus ([place])    Past Surgical History:  Procedure Laterality Date   CARPAL TUNNEL RELEASE Right 06/28/2016   Procedure: RIGHT CARPAL TUNNEL RELEASE;  Surgeon: Daryll Brod, MD;  Location: [facility];  Service: Orthopedics;  Laterality: Right;  ANESTHESIA:  IV REGIONAL FAB   CYSTOSCOPY W/ STONE MANIPULATION     LAPAROSCOPIC CHOLECYSTECTOMY     PROSTATECTOMY  04/2004   Archie Endo 05/16/2011   TONSILLECTOMY     URETHRAL STRICTURE DILATATION  07/2005   Archie Endo 05/16/2011   Social History:  reports that he quit smoking about 31 years ago. His smoking use included cigarettes. He has a 86.00 pack-year smoking history. He has never used smokeless tobacco. He reports that he does not drink alcohol and does not use drugs.  Allergies  Allergen Reactions   Atorvastatin     Other reaction(s): muscle pain   Crestor [Rosuvastatin]     Other reaction(s): muscle pain   Empagliflozin     Other reaction(s): weakness, nausea and vomiting   Ezetimibe     Other reaction(s): Unknown    History reviewed. No pertinent family history.  Prior to Admission medications   Medication Sig Start Date End Date Taking? Authorizing Provider  acetaminophen (TYLENOL) 325 MG tablet Take 2 tablets (650 mg total) by mouth every 6 (six) hours as needed for  mild pain (or Fever >/= 101). 01/08/17   Geradine Girt, DO  amLODipine (NORVASC) 10 MG tablet Take 10 mg by mouth daily. 03/17/22   [provider]  apixaban (ELIQUIS) 5 MG TABS tablet Take 1 tablet (5 mg total) by mouth 2 (two) times daily. 05/23/22   Lorretta Harp, MD  apixaban (ELIQUIS) 5 MG TABS tablet Take 1 tablet (5 mg total) by mouth 2 (two) times daily. 05/23/22   Lorretta Harp, MD  aspirin 81 MG tablet Take 81 mg by mouth daily.    [provider]  atenolol (TENORMIN) 25 MG tablet Take 25 mg by mouth daily.     [provider]  carvedilol (COREG) 6.25 MG tablet Take 6.25 mg by mouth 2 (two) times daily with  a meal.    [provider]  feeding supplement, GLUCERNA SHAKE, (GLUCERNA SHAKE) LIQD Take 237 mLs by mouth 2 (two) times daily between meals. 01/08/17   Geradine Girt, DO  fosinopril (MONOPRIL) 20 MG tablet Take 20 mg by mouth daily.    [provider]  glimepiride (AMARYL) 4 MG tablet Take 4 mg by mouth daily with breakfast. 01/15/17   [provider]  glucose blood (ONETOUCH ULTRA) test strip USE 1 STRIP TO CHECK GLUCOSE ONCE DAILY Dx: Q68.34    [provider]  hydrochlorothiazide (HYDRODIURIL) 25 MG tablet Take 1 tablet by mouth daily.    [provider]  levofloxacin (LEVAQUIN) 750 MG tablet Take 1 tablet (750 mg total) by mouth daily. 01/09/17   Geradine Girt, DO  lisinopril (PRINIVIL,ZESTRIL) 20 MG tablet Take 20 mg by mouth 2 (two) times daily.    [provider]  metFORMIN (GLUCOPHAGE) 1000 MG tablet Take 1,000 mg by mouth 2 (two) times daily with a meal.    [provider]  Potassium Chloride ER 20 MEQ TBCR Take 20 mEq by mouth daily. 05/31/22   [provider]  pravastatin (PRAVACHOL) 10 MG tablet Take 10 mg by mouth daily.    [provider]  tamsulosin (FLOMAX) 0.4 MG CAPS capsule Take 0.4 mg by mouth daily. 05/01/22   [provider]    Physical Exam: Vitals:   06/03/22 1445 06/03/22 1520 06/03/22 1545 06/03/22 1552  BP: (!) 182/73 (!) 186/141 (!) 181/95   Pulse: 67 78 (!) 52 65  Resp: '20 19 20 20  '$ Temp:      TempSrc:      SpO2: 100% 100% 95% 100%   General:  Appears calm and comfortable and is in NAD Eyes:  PERRL, EOMI, normal lids, iris ENT:  grossly normal hearing, lips & tongue, mmm Neck:  no LAD, masses or thyromegaly Cardiovascular:  Irregularly irregular with normal rate, no m/r/g. No LE edema.  Respiratory:   CTA bilaterally with no wheezes/rales/rhonchi.  Normal respiratory effort. Abdomen:  soft, NT, ND Skin:  no rash or induration seen on limited exam Musculoskeletal:   grossly normal tone BUE/BLE, good ROM, no bony abnormality Psychiatric:  grossly normal mood and affect, speech fluent and appropriate, AOx3 Neurologic:  CN 2-12 grossly intact, moves all extremities in coordinated fashion   Radiological Exams on Admission: Independently reviewed - see discussion in A/P where applicable  DG Chest Port 1 View  Result Date: 06/03/2022 CLINICAL DATA:  Orthostasis.  Atrial fibrillation. EXAM: PORTABLE CHEST 1 VIEW COMPARISON:  01/06/2017 and the chest CT of 03/28/2017. FINDINGS: Numerous leads and wires project over the chest. A battery pack projects over the  left side of the chest. Midline trachea. Normal heart size. Atherosclerosis in the transverse aorta. No pleural effusion or pneumothorax. Lucency in the apices with lower lung predominant interstitial thickening and coarsening, consistent with the clinical history of smoking/emphysema. No superimposed lobar consolidation. No congestive failure. IMPRESSION: 1. No acute cardiopulmonary disease. 2. Aortic atherosclerosis. Electronically Signed   By: Abigail Miyamoto M.D.   On: 06/03/2022 14:50    EKG: Independently reviewed.  Afib with rate 72; nonspecific ST changes with no evidence of acute ischemia   Labs on Admission: I have personally reviewed the available labs and imaging studies at the time of the admission.  Pertinent labs:    K+ 2.4 Glucose 116 BUN 16/Creatinine 1.50/GFR 44 HS troponin 46 WBC 4.4 Hgb 12.9   Assessment and Plan: Principal Problem:   Dizziness Active Problems:   Hypertension   Diabetes mellitus without complication (HCC)   Hyperlipidemia   Paroxysmal atrial fibrillation (HCC)   Hypokalemia   AKI (acute kidney injury) ([place])   DNR (do not resuscitate)    Dizziness -Patient presenting with fatigue, light-headedness and so difficulty with ambulation -He also has had diarrhea -He relates all these symptoms to Eliquis -While it is possible that some/all of the symptoms are  related to Eliquis, he appears to have had light-headedness at his visit with Dr. Gwenlyn Found on 5/24 - prior to onset of taking Eliquis -It is also possible that his dizziness is related to afib -Regardless, he has ambulatory dysfunction resulting from dizziness and will need at least overnight observation on telemetry -PT/OT consults tomorrow  Afib -He saw cardiology on 5/24 and was planned for outpatient echo and then eventually DCCV -He may benefit from more expedited planning -Will order echo now (instead of 6/16)  -Dr. Tye [place] have been notified via In basket message about admission with request for consult tomorrow  -He may benefit from DCCV tomorrow -For now, will start heparin and stop Eliquis -He prefers to transition to Coumadin for side effect reasons as well as cost - he understands the need for frequent doctor visits for INR checks as well as dietary restrictions -Will continue carvedilol for now for rate control  AKI -No recent BMP is available but renal function was normal years ago at last check -Will hydrate with the assumption that there is a hypovolemic component that may also be contributing to dizziness -Will avoid nephrotoxic medications  Hypokalemia -K+ was significantly low, which may also be contributing to dizziness -Will replete -Normal Mag++ level  HTN -Continue carvedilol -Hold fosinopril and HCTZ due to renal dysfunction -Will cover with prn hydralazine for now  HLD -Continue pravastatin  DM -Prior A1c was 6.0 in 2018; will not recheck as glucose is reasonably controlled at this time and he is unlikely to suffer long-term effects of uncontrolled DM -hold glimepride -Cover with moderate-scale SSI   DNR -I have discussed code status with the patient and he would not desire resuscitation and would prefer to die a natural death should that situation arise; he is uncertain why God is continuing to keep him here on [place] and is ready when it is  his time. -He will need a gold out of facility DNR form at the time of discharge    Advance Care Planning:   Code Status: DNR   Consults: Cardiology; [facility] team; nutrition; PT/OT  DVT Prophylaxis: Heparin drip, likely starting Coumadin  Family Communication: None present; I spoke with his son by telephone at the time of admission  Severity of Illness: The appropriate patient status for this patient is OBSERVATION. Observation status is judged to be reasonable and necessary in order to provide the required intensity of service to ensure the patient's safety. The patient's presenting symptoms, physical exam findings, and initial radiographic and laboratory data in the context of their medical condition is felt to place them at decreased risk for further clinical deterioration. Furthermore, it is anticipated that the patient will be medically stable for discharge from the hospital within 2 midnights of admission.   Author: Karmen Bongo, MD 06/03/2022 4:38 PM  For on call review [email or web address].

## 2022-06-03 NOTE — Progress Notes (Signed)
ANTICOAGULATION CONSULT NOTE - Initial Consult  Pharmacy Consult for Heparin Indication: atrial fibrillation  Allergies  Allergen Reactions   Atorvastatin     Other reaction(s): muscle pain   Crestor [Rosuvastatin]     Other reaction(s): muscle pain   Empagliflozin     Other reaction(s): weakness, nausea and vomiting   Ezetimibe     Other reaction(s): Unknown    Patient Measurements:   Heparin Dosing Weight: 78.6 kg  Vital Signs: Temp: 99.2 F (37.3 C) (06/04 1330) Temp Source: Oral (06/04 1330) BP: 181/95 (06/04 1545) Pulse Rate: 65 (06/04 1552)  Labs: Recent Labs    06/03/22 1412  HGB 12.9*  HCT 37.1*  PLT 298  CREATININE 1.50*  TROPONINIHS 46*    Estimated Creatinine Clearance: 35.2 mL/min (A) (by C-G formula based on SCr of 1.5 mg/dL (H)).   Medical History: Past Medical History:  Diagnosis Date   Arthritis    "left hand" (01/08/2017)   GERD (gastroesophageal reflux disease)    High cholesterol    History of kidney stones    Hypertension    Osteopenia    PAF (paroxysmal atrial fibrillation) ([place])    Prostate cancer ([place]) 2005   Type II diabetes mellitus (HCC)     Medications:  (Not in a hospital admission)  Scheduled:   carvedilol  6.25 mg Oral BID WC   insulin aspart  0-15 Units Subcutaneous TID WC   potassium chloride  20 mEq Oral Daily   [START ON 06/04/2022] pravastatin  10 mg Oral Daily   sodium chloride flush  3 mL Intravenous Q12H   Infusions:   lactated ringers     potassium chloride Stopped (06/03/22 1639)   PRN: acetaminophen **OR** acetaminophen, hydrALAZINE, ondansetron **OR** ondansetron (ZOFRAN) IV  Assessment: 5 yom with a history of HLD, HTN, DM, afib, and remote prostate CA . Patient is presenting with dizziness. Heparin per pharmacy consult placed for AF while holding apixban per Cardiology recommendations.  Patient is on apixaban prior to arrival. Last dose 6/4am. Will require aPTT monitoring due to likely falsely high  anti-Xa level secondary to DOAC use.  Hgb 12.9; plt 298  Goal of Therapy:  Heparin level 0.3-0.7 units/ml aPTT 66-102 seconds Monitor platelets by anticoagulation protocol: Yes   Plan:  No initial heparin bolus Check baseline heparin level and aPTT Start heparin infusion at 1100 units/hr at 1900 this evening Check aPTT & anti-Xa level at 0300 and daily while on heparin Continue to monitor via aPTT until levels are correlated Continue to monitor H&H and platelets  Lorelei Pont, PharmD, BCPS 06/03/2022 4:45 PM ED Clinical Pharmacist -  [phone]

## 2022-06-03 NOTE — ED Provider Notes (Signed)
[facility] EMERGENCY DEPARTMENT Provider Note   CSN: [id number] Arrival date & time: 06/03/22  1307     History  Chief Complaint  Patient presents with   Weakness    Carl Strong is a 88 y.o. male.  88 yo M with a chief complaints of feeling fatigued and lightheaded.  The patient tells me that every time he stands up or sits up he feels quite lightheaded and has to take some time before he can get up and move around.  This been going on recently and he thinks it is due to the medication change.  He was started on Eliquis and thought that maybe that was causing his symptoms.  At that point he was having trouble getting around at home lives at home alone and so came to the ER for evaluation.  He denies any chest pain or pressure denies any shortness of breath.  Has had a little bit of congestion and a chronic cough that is unchanged.  He did have his blood pressure medicines also changed somewhat recently.  Denies vomiting or diarrhea.   Weakness     Home Medications Prior to Admission medications   Medication Sig Start Date End Date Taking? Authorizing Provider  acetaminophen (TYLENOL) 325 MG tablet Take 2 tablets (650 mg total) by mouth every 6 (six) hours as needed for mild pain (or Fever >/= 101). 01/08/17   Geradine Girt, DO  amLODipine (NORVASC) 10 MG tablet Take 10 mg by mouth daily. 03/17/22   [provider]  apixaban (ELIQUIS) 5 MG TABS tablet Take 1 tablet (5 mg total) by mouth 2 (two) times daily. 05/23/22   Lorretta Harp, MD  apixaban (ELIQUIS) 5 MG TABS tablet Take 1 tablet (5 mg total) by mouth 2 (two) times daily. 05/23/22   Lorretta Harp, MD  aspirin 81 MG tablet Take 81 mg by mouth daily.    [provider]  atenolol (TENORMIN) 25 MG tablet Take 25 mg by mouth daily.     [provider]  carvedilol (COREG) 6.25 MG tablet Take 6.25 mg by mouth 2 (two) times daily with a meal.    [provider]  feeding  supplement, GLUCERNA SHAKE, (GLUCERNA SHAKE) LIQD Take 237 mLs by mouth 2 (two) times daily between meals. 01/08/17   Geradine Girt, DO  fosinopril (MONOPRIL) 20 MG tablet Take 20 mg by mouth daily.    [provider]  glimepiride (AMARYL) 4 MG tablet Take 4 mg by mouth daily with breakfast. 01/15/17   [provider]  glucose blood (ONETOUCH ULTRA) test strip USE 1 STRIP TO CHECK GLUCOSE ONCE DAILY Dx: G28.36    [provider]  hydrochlorothiazide (HYDRODIURIL) 25 MG tablet Take 1 tablet by mouth daily.    [provider]  levofloxacin (LEVAQUIN) 750 MG tablet Take 1 tablet (750 mg total) by mouth daily. 01/09/17   Geradine Girt, DO  lisinopril (PRINIVIL,ZESTRIL) 20 MG tablet Take 20 mg by mouth 2 (two) times daily.    [provider]  metFORMIN (GLUCOPHAGE) 1000 MG tablet Take 1,000 mg by mouth 2 (two) times daily with a meal.    [provider]  Potassium Chloride ER 20 MEQ TBCR Take 20 mEq by mouth daily. 05/31/22   [provider]  pravastatin (PRAVACHOL) 10 MG tablet Take 10 mg by mouth daily.    [provider]  tamsulosin (FLOMAX) 0.4 MG CAPS capsule Take 0.4 mg by mouth  daily. 05/01/22   [provider]      Allergies    Patient has no known allergies.    Review of Systems   Review of Systems  Neurological:  Positive for weakness.   Physical Exam Updated Vital Signs BP (!) 182/73   Pulse 67   Temp 99.2 F (37.3 C) (Oral)   Resp 20   SpO2 100%  Physical Exam Vitals and nursing note reviewed.  Constitutional:      Appearance: He is well-developed.  HENT:     Head: Normocephalic and atraumatic.  Eyes:     Pupils: Pupils are equal, round, and reactive to light.  Neck:     Vascular: No JVD.  Cardiovascular:     Rate and Rhythm: Normal rate and regular rhythm.     Heart sounds: No murmur heard.   No friction rub. No gallop.  Pulmonary:     Effort: No respiratory distress.     Breath sounds: No  wheezing.  Abdominal:     General: There is no distension.     Tenderness: There is no abdominal tenderness. There is no guarding or rebound.  Musculoskeletal:        General: Normal range of motion.     Cervical back: Normal range of motion and neck supple.  Skin:    Coloration: Skin is not pale.     Findings: No rash.  Neurological:     Mental Status: He is alert and oriented to person, place, and time.  Psychiatric:        Behavior: Behavior normal.    ED Results / Procedures / Treatments   Labs (all labs ordered are listed, but only abnormal results are displayed) Labs Reviewed  CBC WITH DIFFERENTIAL/PLATELET - Abnormal; Notable for the following components:      Result Value   Hemoglobin 12.9 (*)    HCT 37.1 (*)    Lymphs Abs 0.6 (*)    All other components within normal limits  BASIC METABOLIC PANEL - Abnormal; Notable for the following components:   Potassium 2.4 (*)    Chloride 94 (*)    Glucose, Bld 116 (*)    Creatinine, Ser 1.50 (*)    Calcium 8.3 (*)    GFR, Estimated 44 (*)    All other components within normal limits  TROPONIN I (HIGH SENSITIVITY) - Abnormal; Notable for the following components:   Troponin I (High Sensitivity) 46 (*)    All other components within normal limits  MAGNESIUM    EKG EKG Interpretation  Date/Time:  Sunday June 03 2022 15:21:06 EDT Ventricular Rate:  72 PR Interval:    QRS Duration: 88 QT Interval:  420 QTC Calculation: 460 R Axis:   80 Text Interpretation: Atrial fibrillation Minimal ST depression, diffuse leads qt normal, ? intermittent u waves Otherwise no significant change Confirmed by Deno Etienne [phone]) on 06/03/2022 3:22:57 PM  Radiology DG Chest Port 1 View  Result Date: 06/03/2022 CLINICAL DATA:  Orthostasis.  Atrial fibrillation. EXAM: PORTABLE CHEST 1 VIEW COMPARISON:  01/06/2017 and the chest CT of 03/28/2017. FINDINGS: Numerous leads and wires project over the chest. A battery pack projects over the left side  of the chest. Midline trachea. Normal heart size. Atherosclerosis in the transverse aorta. No pleural effusion or pneumothorax. Lucency in the apices with lower lung predominant interstitial thickening and coarsening, consistent with the clinical history of smoking/emphysema. No superimposed lobar consolidation. No congestive failure. IMPRESSION: 1. No acute cardiopulmonary disease. 2. Aortic atherosclerosis.  Electronically Signed   By: Abigail Miyamoto M.D.   On: 06/03/2022 14:50    Procedures Procedures    Medications Ordered in ED Medications  potassium chloride 10 mEq in 100 mL IVPB (has no administration in time range)  potassium chloride SA (KLOR-CON M) CR tablet 40 mEq (has no administration in time range)  magnesium oxide (MAG-OX) tablet 800 mg (has no administration in time range)  sodium chloride 0.9 % bolus 1,000 mL (1,000 mLs Intravenous New Bag/Given 06/03/22 1418)  meclizine (ANTIVERT) tablet 25 mg (25 mg Oral Given 06/03/22 1416)    ED Course/ Medical Decision Making/ A&P                           Medical Decision Making Amount and/or Complexity of Data Reviewed Labs: ordered. Radiology: ordered. ECG/medicine tests: ordered.  Risk OTC drugs. Prescription drug management.   88 yo M with a chief complaint of feeling fatigued.  This has been going on for at least a week.  He felt like it is gotten a lot worse over the past couple days.  Found to have a potassium of 2.4.  Mag levels normal.  Will replenish K and mag.  We will discuss with medicine for admission.  No significant anemia troponin is mildly elevated.  Chest x-ray independently interpreted by me without obvious focal infiltrate.  CRITICAL CARE Performed by: Cecilio Asper   Total critical care time: 35 minutes  Critical care time was exclusive of separately billable procedures and treating other patients.  Critical care was necessary to treat or prevent imminent or life-threatening  deterioration.  Critical care was time spent personally by me on the following activities: development of treatment plan with patient and/or surrogate as well as nursing, discussions with consultants, evaluation of patient's response to treatment, examination of patient, obtaining history from patient or surrogate, ordering and performing treatments and interventions, ordering and review of laboratory studies, ordering and review of radiographic studies, pulse oximetry and re-evaluation of patient's condition.  The patients results and plan were reviewed and discussed.   Any x-rays performed were independently reviewed by myself.   Differential diagnosis were considered with the presenting HPI.  Medications  potassium chloride 10 mEq in 100 mL IVPB (has no administration in time range)  potassium chloride SA (KLOR-CON M) CR tablet 40 mEq (has no administration in time range)  magnesium oxide (MAG-OX) tablet 800 mg (has no administration in time range)  sodium chloride 0.9 % bolus 1,000 mL (1,000 mLs Intravenous New Bag/Given 06/03/22 1418)  meclizine (ANTIVERT) tablet 25 mg (25 mg Oral Given 06/03/22 1416)    Vitals:   06/03/22 1345 06/03/22 1415 06/03/22 1430 06/03/22 1445  BP: (!) 166/56 (!) 178/70 (!) 178/72 (!) 182/73  Pulse: 86 (!) 47 (!) 53 67  Resp: (!) 22 18 (!) 21 20  Temp:      TempSrc:      SpO2: 100% 100% 100% 100%    Final diagnoses:  Hypokalemia  Weakness    Admission/ observation were discussed with the admitting physician, patient and/or family and they are comfortable with the plan.         Final Clinical Impression(s) / ED Diagnoses Final diagnoses:  Hypokalemia  Weakness    Rx / DC Orders ED Discharge Orders     None         Deno Etienne, DO 06/03/22 1523

## 2022-06-03 NOTE — ED Triage Notes (Signed)
Pt BIB EMS due to nausea and dizziness,. Pt has a hx of afib. From home.

## 2022-06-04 ENCOUNTER — Observation Stay (HOSPITAL_BASED_OUTPATIENT_CLINIC_OR_DEPARTMENT_OTHER): Payer: Medicare Other

## 2022-06-04 DIAGNOSIS — E876 Hypokalemia: Secondary | ICD-10-CM | POA: Diagnosis not present

## 2022-06-04 DIAGNOSIS — N179 Acute kidney failure, unspecified: Secondary | ICD-10-CM | POA: Diagnosis not present

## 2022-06-04 DIAGNOSIS — I4891 Unspecified atrial fibrillation: Secondary | ICD-10-CM | POA: Diagnosis not present

## 2022-06-04 DIAGNOSIS — E7800 Pure hypercholesterolemia, unspecified: Secondary | ICD-10-CM | POA: Diagnosis not present

## 2022-06-04 DIAGNOSIS — I480 Paroxysmal atrial fibrillation: Secondary | ICD-10-CM

## 2022-06-04 DIAGNOSIS — R531 Weakness: Secondary | ICD-10-CM

## 2022-06-04 DIAGNOSIS — I10 Essential (primary) hypertension: Secondary | ICD-10-CM | POA: Diagnosis not present

## 2022-06-04 DIAGNOSIS — R42 Dizziness and giddiness: Secondary | ICD-10-CM | POA: Diagnosis not present

## 2022-06-04 LAB — BASIC METABOLIC PANEL
Anion gap: 6 (ref 5–15)
BUN: 14 mg/dL (ref 8–23)
CO2: 28 mmol/L (ref 22–32)
Calcium: 7.9 mg/dL — ABNORMAL LOW (ref 8.9–10.3)
Chloride: 100 mmol/L (ref 98–111)
Creatinine, Ser: 1.3 mg/dL — ABNORMAL HIGH (ref 0.61–1.24)
GFR, Estimated: 52 mL/min — ABNORMAL LOW (ref >60)
Glucose, Bld: 66 mg/dL — ABNORMAL LOW (ref 70–99)
Potassium: 2.9 mmol/L — ABNORMAL LOW (ref 3.5–5.1)
Sodium: 134 mmol/L — ABNORMAL LOW (ref 135–145)

## 2022-06-04 LAB — APTT: aPTT: 66 seconds — ABNORMAL HIGH (ref 24–36)

## 2022-06-04 LAB — ECHOCARDIOGRAM COMPLETE
AR max vel: 2.97 cm2
AV Area VTI: 3.13 cm2
AV Area mean vel: 3.03 cm2
AV Mean grad: 3.5 mmHg
AV Peak grad: 6.8 mmHg
Ao pk vel: 1.31 m/s
Area-P 1/2: 4.63 cm2
Height: 72 in
MV M vel: 4.91 m/s
MV Peak grad: 96.4 mmHg
Radius: 0.3 cm
S' Lateral: 3.1 cm
Weight: 2772.5 oz

## 2022-06-04 LAB — FOLATE: Folate: 8.9 ng/mL (ref >5.9)

## 2022-06-04 LAB — CBC
HCT: 32.2 % — ABNORMAL LOW (ref 39.0–52.0)
Hemoglobin: 11.2 g/dL — ABNORMAL LOW (ref 13.0–17.0)
MCH: 29.6 pg (ref 26.0–34.0)
MCHC: 34.8 g/dL (ref 30.0–36.0)
MCV: 85.2 fL (ref 80.0–100.0)
Platelets: 277 10*3/uL (ref 150–400)
RBC: 3.78 MIL/uL — ABNORMAL LOW (ref 4.22–5.81)
RDW: 13.2 % (ref 11.5–15.5)
WBC: 4.6 10*3/uL (ref 4.0–10.5)
nRBC: 0 % (ref 0.0–0.2)

## 2022-06-04 LAB — IRON AND TIBC
Iron: 52 ug/dL (ref 45–182)
Saturation Ratios: 23 % (ref 17.9–39.5)
TIBC: 223 ug/dL — ABNORMAL LOW (ref 250–450)
UIBC: 171 ug/dL

## 2022-06-04 LAB — CBG MONITORING, ED
Glucose-Capillary: 64 mg/dL — ABNORMAL LOW (ref 70–99)
Glucose-Capillary: 98 mg/dL (ref 70–99)
Glucose-Capillary: 216 mg/dL — ABNORMAL HIGH (ref 70–99)

## 2022-06-04 LAB — RETICULOCYTES
Immature Retic Fract: 9.6 % (ref 2.3–15.9)
RBC.: 4.02 MIL/uL — ABNORMAL LOW (ref 4.22–5.81)
Retic Count, Absolute: 60.7 10*3/uL (ref 19.0–186.0)
Retic Ct Pct: 1.5 % (ref 0.4–3.1)

## 2022-06-04 LAB — GLUCOSE, CAPILLARY: Glucose-Capillary: 310 mg/dL — ABNORMAL HIGH (ref 70–99)

## 2022-06-04 LAB — VITAMIN B12: Vitamin B-12: 797 pg/mL (ref 180–914)

## 2022-06-04 LAB — FERRITIN: Ferritin: 243 ng/mL (ref 24–336)

## 2022-06-04 LAB — HEPARIN LEVEL (UNFRACTIONATED): Heparin Unfractionated: >1.1 IU/mL — ABNORMAL HIGH (ref 0.30–0.70)

## 2022-06-04 MED ORDER — DEXTROSE-NACL 5-0.9 % IV SOLN: INTRAVENOUS | Status: DC

## 2022-06-04 MED ORDER — POTASSIUM CHLORIDE 20 MEQ PO PACK
40.0000 meq | PACK | Freq: Three times a day (TID) | ORAL | Status: AC
  Administered 2022-06-04 (×3): 40 meq via ORAL
  Filled 2022-06-04 (×2): qty 2

## 2022-06-04 MED ORDER — INSULIN ASPART 100 UNIT/ML IJ SOLN: 0.0000 [IU] | Freq: Every day | INTRAMUSCULAR | Status: DC

## 2022-06-04 MED ORDER — APIXABAN 5 MG PO TABS
5.0000 mg | ORAL_TABLET | Freq: Two times a day (BID) | ORAL | Status: DC
Start: 2022-06-04 — End: 2022-06-05
  Administered 2022-06-04 – 2022-06-05 (×3): 5 mg via ORAL
  Filled 2022-06-04 (×3): qty 1

## 2022-06-04 MED ORDER — INSULIN ASPART 100 UNIT/ML IJ SOLN
0.0000 [IU] | Freq: Three times a day (TID) | INTRAMUSCULAR | Status: DC
  Administered 2022-06-05: 2 [IU] via SUBCUTANEOUS

## 2022-06-04 MED ORDER — METOPROLOL SUCCINATE ER 25 MG PO TB24: 25.0000 mg | ORAL_TABLET | Freq: Every day | ORAL | Status: DC

## 2022-06-04 NOTE — Progress Notes (Signed)
  Echocardiogram 2D Echocardiogram has been performed.  [name] 06/04/2022, 8:54 AM

## 2022-06-04 NOTE — ED Notes (Signed)
Echo at bedside

## 2022-06-04 NOTE — Progress Notes (Signed)
ANTICOAGULATION CONSULT NOTE   Pharmacy Consult for Heparin Indication: atrial fibrillation  Allergies  Allergen Reactions   Atorvastatin     Other reaction(s): muscle pain   Crestor [Rosuvastatin]     Other reaction(s): muscle pain   Empagliflozin     Other reaction(s): weakness, nausea and vomiting   Ezetimibe     Other reaction(s): Unknown    Patient Measurements: Height: 6' (182.9 cm) Weight: 78.6 kg (173 lb 4.5 oz) IBW/kg (Calculated) : 77.6 Heparin Dosing Weight: 78.6 kg  Vital Signs: Temp: 98.3 F (36.8 C) (06/04 2215) Temp Source: Oral (06/04 2215) BP: 124/53 (06/05 0400) Pulse Rate: 75 (06/05 0400)  Labs: Recent Labs    06/03/22 1412 06/03/22 1800 06/04/22 0339  HGB 12.9*  --   --   HCT 37.1*  --   --   PLT 298  --   --   APTT  --  30 66*  HEPARINUNFRC  --  >1.10*  --   CREATININE 1.50*  --   --   TROPONINIHS 46*  --   --      Estimated Creatinine Clearance: 35.2 mL/min (A) (by C-G formula based on SCr of 1.5 mg/dL (H)).   Medical History: Past Medical History:  Diagnosis Date   Arthritis    "left hand" (01/08/2017)   GERD (gastroesophageal reflux disease)    High cholesterol    History of kidney stones    Hypertension    Osteopenia    PAF (paroxysmal atrial fibrillation) ([facility])    Prostate cancer ([facility]) 2005   Type II diabetes mellitus ([facility])     Medications:  (Not in a hospital admission)  Scheduled:   carvedilol  6.25 mg Oral BID WC   insulin aspart  0-15 Units Subcutaneous TID WC   potassium chloride  20 mEq Oral Daily   pravastatin  10 mg Oral Daily   sodium chloride flush  3 mL Intravenous Q12H   Infusions:   heparin 1,100 Units/hr (06/03/22 1807)   lactated ringers 75 mL/hr at 06/04/22 0339   PRN: acetaminophen **OR** acetaminophen, hydrALAZINE, loperamide, ondansetron **OR** ondansetron (ZOFRAN) IV  Assessment: 29 yom with a history of HLD, HTN, DM, afib, and remote prostate CA . Patient is presenting with dizziness. Heparin  per pharmacy consult placed for AF while holding apixban per Cardiology recommendations.  Patient is on apixaban prior to arrival. Last dose 6/4am. Will require aPTT monitoring due to likely falsely high anti-Xa level secondary to DOAC use.  Hgb 12.9; plt 298  6/5 AM update:  aPTT therapeutic   Goal of Therapy:  Heparin level 0.3-0.7 units/ml aPTT 66-102 seconds Monitor platelets by anticoagulation protocol: Yes   Plan:  Cont heparin 1100 units/hr 1200 aPTT/heparin level  Narda Bonds, PharmD, BCPS Clinical Pharmacist Phone: [phone]

## 2022-06-04 NOTE — Progress Notes (Signed)
LASHAUN KRAPF  ZOX:[id number] DOB: November 12, 1929 DOA: 06/03/2022 PCP: Lajean Manes, MD    Brief Narrative:  88 year old with a history of HTN, DM2, atrial fibrillation, HLD, and remote prostate CA who presented to the ED with nausea and dizziness.  He had recently been diagnosed with atrial fibrillation and started on Eliquis and he felt that his symptoms were related to it.  He reported that he had been lightheaded and having some episodes of diarrhea as well.  Consultants:  Cardiology  Goals of Care:  Code Status: DNR   DVT prophylaxis: Eliquis >heparin  Interim Hx: Some bradycardia appreciated with heart rates dipping as low as 50.  Blood pressure controlled/mildly elevated.  Patient states that he feels somewhat better overall.  Denies chest pain or shortness of breath.  Describes his diarrhea as 1 loose stool a day, typically first thing in the morning, explaining that this has been an issue since starting apixaban and Coreg.  Assessment & Plan:  Dizziness Patient strongly attributes this to Eliquis -doubt Eliquis is actually to blame but fear patient will not be compliant with Eliquis - PT/OT to see -likely a combination of volume depletion and possibly bradycardia -hydrate -beta-blocker stopped  Hypoglycemia Patient follows his CBGs very closely at home and denies any hypoglycemia at home -this may simply be a phenomenon of being stuck in the ER and not eating as much as usual complicated by his AKI and use of an oral hypoglycemic -for now we will hold any diabetes medications and follow CBG closely -placed on dextrose fluids with plan to use it only short-term -given his advanced age overly strict control of his blood sugar is not likely to benefit him and simply puts him at risk for hypoglycemia therefore I will have a low threshold to send him home on no diabetes medications  Sinus bradycardia Heart rates dipping as low as 50 which I fear could be causing symptoms in this patient  - hold BB - monitor on tele   Recently diagnosed atrial fibrillation Followed by Dr. Donnella Bi -plan was for outpatient TTE and eventual DCCV -cardiology consulted -Eliquis stopped per patient request and heparin initiated -attempt to resume Eliquis and monitor patient  Acute kidney injury Slowly improving with volume and holding of diuretics/ACE inhibitor -likely simple prerenal azotemia  Hypokalemia Likely due to poor intake and GI loss from diarrhea as well as use of diuretic -magnesium is normal  HTN Continue Coreg -hold ACE inhibitor and diuretic in setting of AKI  HLD Continue pravastatin  DM2 A1c 6.0 in 2018 -actually mildly hypoglycemic at this time -initiate D5 IV fluid and follow CBGs -see discussion above  Normocytic anemia Check iron studies  Family Communication: No family present at time of exam Disposition: PT/OT evaluations, follow renal function, continue to hydrate, monitor on telemetry -anticipate eventual discharge home 6/6 or 6/7   Objective: Blood pressure (!) 148/55, pulse 84, temperature 98.3 F (36.8 C), temperature source Oral, resp. rate 14, height 6' (1.829 m), weight 78.6 kg, SpO2 96 %.  Intake/Output Summary (Last 24 hours) at 06/04/2022 0749 Last data filed at 06/04/2022 0238 Gross per 24 hour  Intake 1499 ml  Output 300 ml  Net 1199 ml   Filed Weights   06/03/22 1645  Weight: 78.6 kg    Examination: General: No acute respiratory distress Lungs: Clear to auscultation bilaterally without wheezes or crackles Cardiovascular: Bradycardia with heart rate regular but 50 bpm Abdomen: Nontender, nondistended, soft, bowel sounds positive, no rebound,  no ascites, no appreciable mass Extremities: No significant cyanosis, clubbing, or edema bilateral lower extremities  CBC: Recent Labs  Lab 06/03/22 1412 06/04/22 0339  WBC 4.4 4.6  NEUTROABS 3.3  --   HGB 12.9* 11.2*  HCT 37.1* 32.2*  MCV 85.3 85.2  PLT 298 003   Basic Metabolic  Panel: Recent Labs  Lab 06/03/22 1412 06/04/22 0339  NA 135 134*  K 2.4* 2.9*  CL 94* 100  CO2 29 28  GLUCOSE 116* 66*  BUN 16 14  CREATININE 1.50* 1.30*  CALCIUM 8.3* 7.9*  MG 2.1  --    GFR: Estimated Creatinine Clearance: 40.6 mL/min (A) (by C-G formula based on SCr of 1.3 mg/dL (H)).   HbA1C: Hgb A1c MFr Bld  Date/Time Value Ref Range Status  01/06/2017 05:06 PM 6.0 (H) 4.8 - 5.6 % Final    Comment:    (NOTE)         Pre-diabetes: 5.7 - 6.4         Diabetes: >6.4         Glycemic control for adults with diabetes: <7.0     CBG: Recent Labs  Lab 06/03/22 1652 06/03/22 2212 06/04/22 0723  GLUCAP 114* 131* 64*    Scheduled Meds:  carvedilol  6.25 mg Oral BID WC   insulin aspart  0-15 Units Subcutaneous TID WC   potassium chloride  20 mEq Oral Daily   pravastatin  10 mg Oral Daily   sodium chloride flush  3 mL Intravenous Q12H   Continuous Infusions:  dextrose 5 % and 0.9% NaCl     heparin 1,100 Units/hr (06/03/22 1807)     LOS: 0 days   Cherene Altes, MD [facility] Office  [phone] Pager - Text Page per Shea Evans  If 7PM-7AM, please contact night-coverage per Amion 06/04/2022, 7:49 AM

## 2022-06-04 NOTE — Progress Notes (Signed)
Pt arrived to unit from ED  A/O x 4,  CCMD called ,CHG given, pt oriented to unit,Will continue to monitor.   Albin Felling Enedina Pair, RN    06/04/22 1824  Vitals  Temp 98.5 F (36.9 C)  Temp Source Oral  BP (!) 145/91  MAP (mmHg) 103  BP Location Right Arm  BP Method Automatic  Patient Position (if appropriate) Sitting  Pulse Rate 90  Pulse Rate Source Monitor  ECG Heart Rate 60  Resp 20  Level of Consciousness  Level of Consciousness Alert  Oxygen Therapy  SpO2 96 %  O2 Device Room Air  ECG Monitoring  Telemetry Box Number [id number]  Tele Box Verification Completed by Second Verifier Completed  Pain Assessment  Pain Scale 0-10  Pain Score 0  Glasgow Coma Scale  Eye Opening 4  Best Verbal Response (NON-intubated) 5  Best Motor Response 6  Glasgow Coma Scale Score 15  MEWS Score  MEWS Temp 0  MEWS Systolic 0  MEWS Pulse 0  MEWS RR 0  MEWS LOC 0  MEWS Score 0  MEWS Score Color Green

## 2022-06-04 NOTE — Evaluation (Signed)
Occupational Therapy Evaluation Patient Details Name: Carl Strong MRN: [id number] DOB: 1929/08/27 Today's Date: 06/04/2022   History of Present Illness Pt is a 88 y/o male presenting on 6/4 nausea and dizziness. Anticipate medication related, Possible DCCV while here (was planned for late June). Found with AKI, hypokalemia. PMH includes HTN, DM, afib, prostate CA.   Clinical Impression   PTA patient independent and driving (short distances).  Admitted for above and presents near baseline level for ADLs and mobility, today requiring min guard dynamically for safety.  He reports feeling close to baseline, but "weaker" than normal.  Will follow acutely to optimize independence and safety, but anticipate no further needs after dc home.      Recommendations for follow up therapy are one component of a multi-disciplinary discharge planning process, led by the attending physician.  Recommendations may be updated based on patient status, additional functional criteria and insurance authorization.   Follow Up Recommendations  No OT follow up    Assistance Recommended at Discharge PRN  Patient can return home with the following A little help with walking and/or transfers;A little help with bathing/dressing/bathroom;Help with stairs or ramp for entrance;Assistance with cooking/housework    Functional Status Assessment  Patient has had a recent decline in their functional status and demonstrates the ability to make significant improvements in function in a reasonable and predictable amount of time.  Equipment Recommendations  BSC/3in1    Recommendations for Other Services       Precautions / Restrictions Precautions Precautions: None Restrictions Weight Bearing Restrictions: No      Mobility Bed Mobility Overal bed mobility: Needs Assistance Bed Mobility: Supine to Sit, Sit to Supine     Supine to sit: Min assist Sit to supine: Min guard   General bed mobility comments: min assist  to ascend trunk from stretcher, min guard to return for safety    Transfers                          Balance Overall balance assessment: Mild deficits observed, not formally tested                                         ADL either performed or assessed with clinical judgement   ADL Overall ADL's : Needs assistance/impaired     Grooming: Min guard;Standing           Upper Body Dressing : Set up;Sitting   Lower Body Dressing: Min guard;Sit to/from stand   Toilet Transfer: Min guard;Ambulation   Toileting- Clothing Manipulation and Hygiene: Min guard;Sit to/from stand       Functional mobility during ADLs: Min guard       Vision   Vision Assessment?: No apparent visual deficits     Perception     Praxis      Pertinent Vitals/Pain Pain Assessment Pain Assessment: No/denies pain     Hand Dominance Right   Extremity/Trunk Assessment Upper Extremity Assessment Upper Extremity Assessment: Overall WFL for tasks assessed   Lower Extremity Assessment Lower Extremity Assessment: Defer to PT evaluation       Communication Communication Communication: No difficulties   Cognition Arousal/Alertness: Awake/alert Behavior During Therapy: WFL for tasks assessed/performed Overall Cognitive Status: Within Functional Limits for tasks assessed  General Comments: appears WFL     General Comments  VSS on RA, HR stable    Exercises     Shoulder Instructions      Home Living Family/patient expects to be discharged to:: Private residence Living Arrangements: Alone Available Help at Discharge: Family;Available PRN/intermittently Type of Home: House Home Access: Stairs to enter [employer] of Steps: 4 Entrance Stairs-Rails: Can reach both Home Layout: One level     Bathroom Shower/Tub: Occupational psychologist: Handicapped height     Home Equipment: Grab bars -  toilet;Grab bars - tub/shower          Prior Functioning/Environment Prior Level of Function : Independent/Modified Independent;Driving                        OT Problem List: Decreased strength;Impaired balance (sitting and/or standing);Decreased activity tolerance;Decreased safety awareness;Decreased knowledge of use of DME or AE;Decreased knowledge of precautions      OT Treatment/Interventions: Self-care/ADL training;Therapeutic exercise;DME and/or AE instruction;Therapeutic activities;Patient/family education;Balance training;Energy conservation    OT Goals(Current goals can be found in the care plan section) Acute Rehab OT Goals Patient Stated Goal: home OT Goal Formulation: With patient Time For Goal Achievement: 06/18/22 Potential to Achieve Goals: Good  OT Frequency: Min 2X/week    Co-evaluation              AM-PAC OT "6 Clicks" Daily Activity     Outcome Measure Help from another person eating meals?: None Help from another person taking care of personal grooming?: A Little Help from another person toileting, which includes using toliet, bedpan, or urinal?: A Little Help from another person bathing (including washing, rinsing, drying)?: A Little Help from another person to put on and taking off regular upper body clothing?: A Little Help from another person to put on and taking off regular lower body clothing?: A Little 6 Click Score: 19   End of Session Nurse Communication: Mobility status  Activity Tolerance: Patient tolerated treatment well Patient left: in bed;with call bell/phone within reach;with nursing/sitter in room  OT Visit Diagnosis: Muscle weakness (generalized) (M62.81);Unsteadiness on feet (R26.81)                Time: 2585-2778 OT Time Calculation (min): 13 min Charges:  OT General Charges $OT Visit: 1 Visit OT Evaluation $OT Eval Low Complexity: 1 Low  Jolaine Artist, OT Acute Rehabilitation Services Office  [phone]   Delight Stare 06/04/2022, 10:49 AM

## 2022-06-04 NOTE — Consult Note (Signed)
Cardiology Consultation:   Patient ID: Carl Strong MRN: [id number]; DOB: Jul 04, 1929  Admit date: 06/03/2022 Date of Consult: 06/04/2022  PCP:  Lajean Manes, MD   [facility] Providers Cardiologist:  Quay Burow, MD      Patient Profile:   Carl Strong is a 88 y.o. male with a hx of HLD, HTN, type 2 DM, atrial fibrillation, remote prostate cancer who is being seen 06/04/2022 for the evaluation of atrial fibrillation with RVR at the request of Dr. Thereasa Solo.  History of Present Illness:   Mr. Carl Strong is a 88 year old male with above medical history who is followed by Dr. Gwenlyn Found. Per chart review, patient was referred to see Dr. Gwenlyn Found on 05/23/22 for evaluation of newly recognized atrial fibrillation. Patient denies every having heart attack or stroke. Dr. Gwenlyn Found started patient on carvedilol, eliquis. Planned to have patient undergo outpatient echocardiogram, wear 14 day monitor, and to undergo outpatient cardioversion in the next 3/4 weeks, but patient presented to the ED on 6/4 before these tests were completed.   Patient presented to the ED on 6/4 complaining of nausea, and dizziness. Also reported some instances of light-headedness with standing. Labs in the ED showed K 2.4, Na 135, creatinine 1.50, mag 2.1, WBC 4.4, hemoglobin 12.9, platelets 298. hsTn 46.  CXR showed no acute cardiopulmonary disease    Past Medical History:  Diagnosis Date   Arthritis    "left hand" (01/08/2017)   GERD (gastroesophageal reflux disease)    High cholesterol    History of kidney stones    Hypertension    Osteopenia    PAF (paroxysmal atrial fibrillation) ([place])    Prostate cancer ([place]) 2005   Type II diabetes mellitus ([place])     Past Surgical History:  Procedure Laterality Date   CARPAL TUNNEL RELEASE Right 06/28/2016   Procedure: RIGHT CARPAL TUNNEL RELEASE;  Surgeon: Daryll Brod, MD;  Location: [place];  Service: Orthopedics;  Laterality: Right;  ANESTHESIA:  IV  REGIONAL FAB   CYSTOSCOPY W/ STONE MANIPULATION     LAPAROSCOPIC CHOLECYSTECTOMY     PROSTATECTOMY  04/2004   Archie Endo 05/16/2011   TONSILLECTOMY     URETHRAL STRICTURE DILATATION  07/2005   Archie Endo 05/16/2011     Home Medications:  Prior to Admission medications   Medication Sig Start Date End Date Taking? Authorizing Provider  apixaban (ELIQUIS) 5 MG TABS tablet Take 1 tablet (5 mg total) by mouth 2 (two) times daily. 05/23/22  Yes Lorretta Harp, MD  carvedilol (COREG) 6.25 MG tablet Take 6.25 mg by mouth 2 (two) times daily with a meal.   Yes [provider]  fosinopril (MONOPRIL) 40 MG tablet Take 40 mg by mouth daily.   Yes [provider]  glimepiride (AMARYL) 4 MG tablet Take 4 mg by mouth 2 (two) times daily. 01/15/17  Yes [provider]  hydrochlorothiazide (HYDRODIURIL) 25 MG tablet Take 1 tablet by mouth daily.   Yes [provider]  pravastatin (PRAVACHOL) 10 MG tablet Take 10 mg by mouth daily.   Yes [provider]  acetaminophen (TYLENOL) 325 MG tablet Take 2 tablets (650 mg total) by mouth every 6 (six) hours as needed for mild pain (or Fever >/= 101). Patient not taking: Reported on 06/03/2022 01/08/17   Carl Bear U, DO  glucose blood (ONETOUCH ULTRA) test strip USE 1 STRIP TO CHECK GLUCOSE ONCE DAILY Dx: E11.42    [provider]  Potassium Chloride ER 20 MEQ  TBCR Take 20 mEq by mouth daily. Patient not taking: Reported on 06/03/2022 05/31/22   [provider]    Inpatient Medications: Scheduled Meds:  apixaban  5 mg Oral BID   metoprolol succinate  25 mg Oral Daily   potassium chloride  40 mEq Oral TID   pravastatin  10 mg Oral Daily   sodium chloride flush  3 mL Intravenous Q12H   Continuous Infusions:  dextrose 5 % and 0.9% NaCl 75 mL/hr at 06/04/22 0749   PRN Meds: acetaminophen **OR** [DISCONTINUED] acetaminophen, hydrALAZINE, loperamide, ondansetron **OR** ondansetron (ZOFRAN) IV  Allergies:     Allergies  Allergen Reactions   Atorvastatin     Other reaction(s): muscle pain   Crestor [Rosuvastatin]     Other reaction(s): muscle pain   Empagliflozin     Other reaction(s): weakness, nausea and vomiting   Ezetimibe     Other reaction(s): Unknown    Social History:   Social History   Socioeconomic History   Marital status: Widowed    Spouse name: Not on file   Number of children: Not on file   Years of education: Not on file   Highest education level: Not on file  Occupational History   Occupation: retired Administrator  Tobacco Use   Smoking status: Former    Packs/day: 2.00    Years: 43.00    Pack years: 86.00    Types: Cigarettes    Quit date: 02/13/1991    Years since quitting: 31.3   Smokeless tobacco: Never  Substance and Sexual Activity   Alcohol use: No   Drug use: No   Sexual activity: Not on file  Other Topics Concern   Not on file  Social History Narrative   Not on file   Social Determinants of Health   Financial Resource Strain: Not on file  Food Insecurity: Not on file  Transportation Needs: Not on file  Physical Activity: Not on file  Stress: Not on file  Social Connections: Not on file  Intimate Partner Violence: Not on file    Family History:   History reviewed. No pertinent family history.   ROS:  Please see the history of present illness.  All other ROS reviewed and negative.     Physical Exam/Data:   Vitals:   06/04/22 1045 06/04/22 1115 06/04/22 1130 06/04/22 1200  BP: 106/62 107/72 (!) 108/48 (!) 128/59  Pulse: (!) 35 (!) 53 (!) 53 (!) 45  Resp: '14 18 17 20  '$ Temp:      TempSrc:      SpO2: 97% 94% 94% 99%  Weight:      Height:        Intake/Output Summary (Last 24 hours) at 06/04/2022 1226 Last data filed at 06/04/2022 0238 Gross per 24 hour  Intake 1499 ml  Output 300 ml  Net 1199 ml      06/03/2022    4:45 PM 05/23/2022   10:19 AM 01/07/2017    4:03 AM  Last 3 Weights  Weight (lbs) 173 lb 4.5 oz 173 lb 3.2 oz 188  lb  Weight (kg) 78.6 kg 78.563 kg 85.276 kg     Body mass index is 23.5 kg/m.  General:  Well nourished, appears younger than stated age, no acute distress  HEENT: normal Neck: no JVD Vascular: Faint bilateral carotid bruits; radial pulses 2+ bilaterally Cardiac:  normal S1, S2; RRR; no murmur Lungs:  clear to auscultation bilaterally, no wheezing, rhonchi or rales  Abd: soft, nontender, no  hepatomegaly  Ext: no edema Musculoskeletal:  No deformities, BUE and BLE strength normal and equal Skin: warm and dry  Neuro:  CNs 2-12 intact, no focal abnormalities noted Psych:  Normal affect   EKG:  The EKG was personally reviewed and demonstrates:  Sinus rhythm with PACs, HR 72 BPM  Telemetry:  Telemetry was personally reviewed and demonstrates:  sinus rhythm with PACs   Relevant CV Studies:   Laboratory Data:  High Sensitivity Troponin:   Recent Labs  Lab 06/03/22 1412  TROPONINIHS 46*     Chemistry Recent Labs  Lab 06/03/22 1412 06/04/22 0339  NA 135 134*  K 2.4* 2.9*  CL 94* 100  CO2 29 28  GLUCOSE 116* 66*  BUN 16 14  CREATININE 1.50* 1.30*  CALCIUM 8.3* 7.9*  MG 2.1  --   GFRNONAA 44* 52*  ANIONGAP 12 6    No results for input(s): PROT, ALBUMIN, AST, ALT, ALKPHOS, BILITOT in the last 168 hours. Lipids No results for input(s): CHOL, TRIG, HDL, LABVLDL, LDLCALC, CHOLHDL in the last 168 hours.  Hematology Recent Labs  Lab 06/03/22 1412 06/04/22 0339 06/04/22 0904  WBC 4.4 4.6  --   RBC 4.35 3.78* 4.02*  HGB 12.9* 11.2*  --   HCT 37.1* 32.2*  --   MCV 85.3 85.2  --   MCH 29.7 29.6  --   MCHC 34.8 34.8  --   RDW 12.9 13.2  --   PLT 298 277  --    Thyroid No results for input(s): TSH, FREET4 in the last 168 hours.  BNPNo results for input(s): BNP, PROBNP in the last 168 hours.  DDimer No results for input(s): DDIMER in the last 168 hours.   Radiology/Studies:  DG Chest Port 1 View  Result Date: 06/03/2022 CLINICAL DATA:  Orthostasis.  Atrial  fibrillation. EXAM: PORTABLE CHEST 1 VIEW COMPARISON:  01/06/2017 and the chest CT of 03/28/2017. FINDINGS: Numerous leads and wires project over the chest. A battery pack projects over the left side of the chest. Midline trachea. Normal heart size. Atherosclerosis in the transverse aorta. No pleural effusion or pneumothorax. Lucency in the apices with lower lung predominant interstitial thickening and coarsening, consistent with the clinical history of smoking/emphysema. No superimposed lobar consolidation. No congestive failure. IMPRESSION: 1. No acute cardiopulmonary disease. 2. Aortic atherosclerosis. Electronically Signed   By: Abigail Miyamoto M.D.   On: 06/03/2022 14:50     Assessment and Plan:   Atrial Fibrillation   - Per telemetry-- patient is in sinus rhythm with PACs this admission  - First diagnosed with afib on 5/24-- was started on carvedilol and eliquis at that time  - Believe eliquis is causing him to have weakness, dizziness, diarrhea. However, he was having these symptoms prior to starting eliquis  - Discussed that as he was having symptoms prior to starting eliquis, it is unlikely that eliquis was the cause - Restarted eliquis  - Patient reported his dizziness is only upon standing-- suspicious for orthostatic hypotension - Concerned that his BP is being over treated, stopped carvedilol and transitioned to metoprolol 25 mg daily as this should have less effect on BP  - Patient in sinus rhythm-- no need for TEE/cardioversion at this time   Dizziness  - Patient complains of dizziness upon standing, feels like he is going to pass out. Denies feeling like the room is spinning  - Ordered carotid dopplers  - Patient was taking carvedilol, fosinopril, HCTZ prior to admission. Question if patient's  HTN was over-treated and he was having orthostatic hypotension  - Ordered orthostatic vital signs - Could also be secondary to diarrhea, hypokalemia   - Patient has received some IV fluids  this admission, symptoms have improved   AKI  - Creatinine elevated to 1.5 on admission, improved to 1.3 today  - Given IV fluids with improvement in dizziness/weakness - Holding nephrotoxic medications   HTN  - PTA, patient was on fosinopril, carvedilol, and HCTZ  - Fosinopril and HCTZ held in the setting of AKI - Hold carvedilol, continue metoprolol as above   HLD  - Continue pravastatin    Risk Assessment/Risk Scores:    CHA2DS2-VASc Score = 4  This indicates a 4.8% annual risk of stroke. The patient's score is based upon: CHF History: 0 HTN History: 1 Diabetes History: 1 Stroke History: 0 Vascular Disease History: 0 Age Score: 2 Gender Score: 0         For questions or updates, please contact [place] Please consult [email or web address] for contact info under    Signed, Margie Billet, PA-C  06/04/2022 12:26 PM

## 2022-06-04 NOTE — Evaluation (Signed)
Physical Therapy Evaluation Patient Details Name: Carl Strong MRN: [id number] DOB: [birth year] Today's Date: 06/04/2022  History of Present Illness  Pt is a 88 y/o male presenting on 6/4 nausea and dizziness. Anticipate medication related, Possible DCCV while here (was planned for late June). Found with AKI, hypokalemia. PMH includes HTN, DM, afib, prostate CA.  Clinical Impression  Pt admitted secondary to problem above with deficits below. Pt with mild unsteadiness when performing DGI tasks. Otherwise tolerated mobility well and was asymptomatic. Anticipate pt will progress well and will not require follow up PT. Will continue to follow acutely.        Recommendations for follow up therapy are one component of a multi-disciplinary discharge planning process, led by the attending physician.  Recommendations may be updated based on patient status, additional functional criteria and insurance authorization.  Follow Up Recommendations No PT follow up    Assistance Recommended at Discharge Intermittent Supervision/Assistance  Patient can return home with the following  Assistance with cooking/housework    Equipment Recommendations None recommended by PT  Recommendations for Other Services       Functional Status Assessment Patient has had a recent decline in their functional status and demonstrates the ability to make significant improvements in function in a reasonable and predictable amount of time.     Precautions / Restrictions Precautions Precautions: None Restrictions Weight Bearing Restrictions: No      Mobility  Bed Mobility Overal bed mobility: Needs Assistance Bed Mobility: Supine to Sit, Sit to Supine     Supine to sit: Min assist Sit to supine: Min guard   General bed mobility comments: min assist to ascend trunk from stretcher, min guard to return for safety    Transfers Overall transfer level: Needs assistance Equipment used: None Transfers: Sit to/from  Stand Sit to Stand: Min guard           General transfer comment: Min guard for safety    Ambulation/Gait Ambulation/Gait assistance: Min guard, Supervision Gait Distance (Feet): 150 Feet Assistive device: None Gait Pattern/deviations: Step-through pattern, Decreased stride length Gait velocity: Decreased     General Gait Details: Mild unsteadiness with horizontal and vertical head turns requiring min guard A. Otherwise requiring supervision for safety. Pt asymptomatic throughout  Stairs            Wheelchair Mobility    Modified Rankin (Stroke Patients Only)       Balance Overall balance assessment: Mild deficits observed, not formally tested                                           Pertinent Vitals/Pain Pain Assessment Pain Assessment: No/denies pain    Home Living Family/patient expects to be discharged to:: Private residence Living Arrangements: Alone Available Help at Discharge: Family;Available PRN/intermittently Type of Home: House Home Access: Stairs to enter Entrance Stairs-Rails: Can reach both Entrance Stairs-Number of Steps: 4   Home Layout: One level Home Equipment: Grab bars - toilet;Grab bars - tub/shower      Prior Function Prior Level of Function : Independent/Modified Independent;Driving                     Hand Dominance   Dominant Hand: Right    Extremity/Trunk Assessment   Upper Extremity Assessment Upper Extremity Assessment: Defer to OT evaluation    Lower Extremity Assessment Lower Extremity Assessment: Generalized weakness  Cervical / Trunk Assessment Cervical / Trunk Assessment: Normal  Communication   Communication: No difficulties  Cognition Arousal/Alertness: Awake/alert Behavior During Therapy: WFL for tasks assessed/performed Overall Cognitive Status: Within Functional Limits for tasks assessed                                 General Comments: appears [facility]         General Comments General comments (skin integrity, edema, etc.): VSS    Exercises     Assessment/Plan    PT Assessment Patient needs continued PT services  PT Problem List Decreased strength;Decreased balance;Decreased mobility;Decreased knowledge of use of DME;Decreased knowledge of precautions       PT Treatment Interventions Gait training;Stair training;Functional mobility training;Therapeutic activities;Therapeutic exercise;Balance training;Patient/family education    PT Goals (Current goals can be found in the Care Plan section)  Acute Rehab PT Goals Patient Stated Goal: to go home PT Goal Formulation: With patient Time For Goal Achievement: 06/18/22 Potential to Achieve Goals: Good    Frequency Min 2X/week     Co-evaluation               AM-PAC PT "6 Clicks" Mobility  Outcome Measure Help needed turning from your back to your side while in a flat bed without using bedrails?: None Help needed moving from lying on your back to sitting on the side of a flat bed without using bedrails?: A Little Help needed moving to and from a bed to a chair (including a wheelchair)?: A Little Help needed standing up from a chair using your arms (e.g., wheelchair or bedside chair)?: A Little Help needed to walk in hospital room?: A Little Help needed climbing 3-5 steps with a railing? : A Little 6 Click Score: 19    End of Session Equipment Utilized During Treatment: Gait belt Activity Tolerance: Patient tolerated treatment well Patient left: in bed;with call bell/phone within reach;with nursing/sitter in room (on stretcher in ED) Nurse Communication: Mobility status PT Visit Diagnosis: Unsteadiness on feet (R26.81);Muscle weakness (generalized) (M62.81)    Time: 6387-5643 PT Time Calculation (min) (ACUTE ONLY): 15 min   Charges:   PT Evaluation $PT Eval Low Complexity: 1 Low          Reuel Derby, PT, DPT  Acute Rehabilitation Services  Office: [phone]   Rudean Hitt 06/04/2022, 1:40 PM

## 2022-06-05 ENCOUNTER — Observation Stay (HOSPITAL_BASED_OUTPATIENT_CLINIC_OR_DEPARTMENT_OTHER): Payer: Medicare Other

## 2022-06-05 ENCOUNTER — Other Ambulatory Visit (HOSPITAL_COMMUNITY): Payer: Self-pay

## 2022-06-05 DIAGNOSIS — E876 Hypokalemia: Secondary | ICD-10-CM | POA: Diagnosis not present

## 2022-06-05 DIAGNOSIS — R42 Dizziness and giddiness: Secondary | ICD-10-CM | POA: Diagnosis not present

## 2022-06-05 DIAGNOSIS — N179 Acute kidney failure, unspecified: Secondary | ICD-10-CM | POA: Diagnosis not present

## 2022-06-05 DIAGNOSIS — E7800 Pure hypercholesterolemia, unspecified: Secondary | ICD-10-CM | POA: Diagnosis not present

## 2022-06-05 DIAGNOSIS — E440 Moderate protein-calorie malnutrition: Secondary | ICD-10-CM | POA: Insufficient documentation

## 2022-06-05 DIAGNOSIS — I10 Essential (primary) hypertension: Secondary | ICD-10-CM | POA: Diagnosis not present

## 2022-06-05 LAB — CBC
HCT: 31.2 % — ABNORMAL LOW (ref 39.0–52.0)
Hemoglobin: 10.4 g/dL — ABNORMAL LOW (ref 13.0–17.0)
MCH: 29 pg (ref 26.0–34.0)
MCHC: 33.3 g/dL (ref 30.0–36.0)
MCV: 86.9 fL (ref 80.0–100.0)
Platelets: 253 10*3/uL (ref 150–400)
RBC: 3.59 MIL/uL — ABNORMAL LOW (ref 4.22–5.81)
RDW: 13.2 % (ref 11.5–15.5)
WBC: 5.5 10*3/uL (ref 4.0–10.5)
nRBC: 0 % (ref 0.0–0.2)

## 2022-06-05 LAB — GLUCOSE, CAPILLARY
Glucose-Capillary: 125 mg/dL — ABNORMAL HIGH (ref 70–99)
Glucose-Capillary: 219 mg/dL — ABNORMAL HIGH (ref 70–99)

## 2022-06-05 LAB — BASIC METABOLIC PANEL
Anion gap: 5 (ref 5–15)
BUN: 11 mg/dL (ref 8–23)
CO2: 24 mmol/L (ref 22–32)
Calcium: 7.8 mg/dL — ABNORMAL LOW (ref 8.9–10.3)
Chloride: 105 mmol/L (ref 98–111)
Creatinine, Ser: 1.24 mg/dL (ref 0.61–1.24)
GFR, Estimated: 55 mL/min — ABNORMAL LOW (ref >60)
Glucose, Bld: 192 mg/dL — ABNORMAL HIGH (ref 70–99)
Potassium: 3.3 mmol/L — ABNORMAL LOW (ref 3.5–5.1)
Sodium: 134 mmol/L — ABNORMAL LOW (ref 135–145)

## 2022-06-05 LAB — HEMOGLOBIN A1C
Hgb A1c MFr Bld: 6.9 % — ABNORMAL HIGH (ref 4.8–5.6)
Mean Plasma Glucose: 151.33 mg/dL

## 2022-06-05 LAB — MAGNESIUM: Magnesium: 1.8 mg/dL (ref 1.7–2.4)

## 2022-06-05 MED ORDER — POTASSIUM CHLORIDE CRYS ER 20 MEQ PO TBCR
40.0000 meq | EXTENDED_RELEASE_TABLET | Freq: Once | ORAL | Status: AC
Start: 2022-06-05 — End: 2022-06-05
  Administered 2022-06-05: 40 meq via ORAL
  Filled 2022-06-05: qty 2

## 2022-06-05 MED ORDER — ADULT MULTIVITAMIN W/MINERALS CH: 1.0000 | ORAL_TABLET | Freq: Every day | ORAL | Status: DC

## 2022-06-05 MED ORDER — METOPROLOL SUCCINATE ER 25 MG PO TB24
12.5000 mg | ORAL_TABLET | Freq: Every day | ORAL | Status: DC
  Administered 2022-06-05: 12.5 mg via ORAL
  Filled 2022-06-05: qty 1

## 2022-06-05 MED ORDER — POTASSIUM CHLORIDE CRYS ER 20 MEQ PO TBCR
40.0000 meq | EXTENDED_RELEASE_TABLET | Freq: Once | ORAL | Status: AC
  Administered 2022-06-05: 40 meq via ORAL
  Filled 2022-06-05: qty 2

## 2022-06-05 MED ORDER — LOSARTAN POTASSIUM 25 MG PO TABS: 25.0000 mg | ORAL_TABLET | Freq: Every day | ORAL | Status: DC

## 2022-06-05 MED ORDER — METOPROLOL SUCCINATE ER 25 MG PO TB24
12.5000 mg | ORAL_TABLET | Freq: Every day | ORAL | 1 refills | Status: AC
  Filled 2022-06-05: qty 15, 30d supply, fill #0

## 2022-06-05 MED ORDER — ENSURE ENLIVE PO LIQD: 237.0000 mL | Freq: Two times a day (BID) | ORAL | Status: DC

## 2022-06-05 MED ORDER — GLIMEPIRIDE 4 MG PO TABS: 4.0000 mg | ORAL_TABLET | Freq: Every day | ORAL | Status: AC

## 2022-06-05 NOTE — Progress Notes (Signed)
Carotid duplex bilateral study completed.   Please see CV Proc for preliminary results.   Mary-Ann Pennella, RDMS, RVT  

## 2022-06-05 NOTE — Discharge Summary (Signed)
DISCHARGE SUMMARY  Carl Strong  MR#: [id number]  DOB:[birth year]  Date of Admission: 06/03/2022 Date of Discharge: 06/05/2022  Attending Physician:Tenlee Wollin Hennie Duos, MD  Patient's JIR:[id number], Christiane Ha, MD  Consults: [facility]   Disposition: D/C home   Follow-up Appts:  Follow-up Information     Lajean Manes, MD Follow up.   Specialty: Internal Medicine Contact information: [address] [place] [address] [phone]         Lorretta Harp, MD. Schedule an appointment as soon as possible for a visit.   Specialties: Cardiology, Radiology Contact information: [address] [place] [address] [place] [address] [phone]                 Tests Needing Follow-up: -Recheck blood pressure with willingness to accept higher values for the sake of avoiding hypotension -Check potassium -Check heart rate with recent complications from both RVR and bradycardia -Check CBG log with possible need to find alternative treatment if there is evidence of recurrent hypoglycemia with Amaryl   Discharge Diagnoses: Dizziness and fatigue Sinus bradycardia Recently diagnosed atrial fibrillation Grade 2 diastolic dysfunction Acute kidney injury Hypokalemia HTN HLD DM2 with transient Hypoglycemia Normocytic anemia Malnutrition of moderate degree  Initial presentation: 88 year old who lives independently in [place] with a history of HTN, DM2, atrial fibrillation, HLD, and remote prostate CA who presented to the ED with nausea and dizziness.  He had recently been diagnosed with atrial fibrillation and started on Eliquis and he felt that his symptoms were related to it.  He reported that he had been lightheaded and having some episodes of diarrhea as well.  Hospital Course:  Dizziness / Fatigue  likely a combination of volume depletion and coreg induced bradycardia - hydrated w/ IVF - coreg stopped -TTE notes EF 60-65% with normal LV  systolic function, no WMA, grade 2 DD and no AVS - sx resolved w/ improved HR and volume expansion - liberalize BP goal to avoid hypotension/overcorrection given advanced age   Sinus bradycardia Heart rates dipped into the 50s and intermittently into the 40s during the initial portion of this admission - likely a major contributor to his presetting fatigue and dizziness - coreg stooped - very low dose toprol initiated by Cards on day of d/c - pt instructed to follow his HR at home    Hypoglycemia Patient follows his CBGs very closely at home and denies any hypoglycemia at home -this may simply have been a result of being stuck in the ER and not eating as much as usual complicated by his AKI and use of an oral hypoglycemic - given his advanced age overly strict control of his blood sugar is not likely to benefit him and simply puts him at risk for hypoglycemia therefore I lowered his amaryl dose at home - he is instructed to follow his CBG closely    Recently diagnosed atrial fibrillation Followed by Dr. Donnella Bi -plan was for outpatient TTE and eventual DCCV -cardiology consulted during this admit - continue Eliquis- pt maintaining NSR th/o this admit -no further intervention this hospital stay   Grade 2 diastolic dysfunction Noted on TTE but with no clinical evidence of CHF -monitor clinically -in setting of AKI and clinical dehydration at presentation we will avoid diuretic for now    Acute kidney injury Slowly improved with volume and holding of diuretics/ACE inhibitor -likely simple prerenal azotemia -improved with hydration - holding off on diuretic and ACE at time of d/c  Hypokalemia Likely due to poor intake and GI loss from diarrhea as well as use of diuretic -magnesium is normal - improved prior to d/c    HTN Coreg discontinued due to bradycardia and possible link to diarrhea - held ACE inhibitor and diuretic in setting of AKI -initiate ARB if blood pressure becomes  significantly elevated but given advanced age will accept systolics as high as 161 before intervention   HLD Continue pravastatin   DM2 A1c 6.0 in 2018 -recurrent hypoglycemia has not been an issue while on dextrose - to lower dose of amaryl at home and follow CBGs closely    Normocytic anemia Iron studies most consistent with anemia of malnutrition/advanced age  Malnutrition of moderate degree Due to chronic illness - as evidenced by mild fat depletion, moderate muscle depletion  Allergies as of 06/05/2022       Reactions   Atorvastatin    Other reaction(s): muscle pain   Crestor [rosuvastatin]    Other reaction(s): muscle pain   Empagliflozin    Other reaction(s): weakness, nausea and vomiting   Ezetimibe    Other reaction(s): Unknown        Medication List     STOP taking these medications    carvedilol 6.25 MG tablet Commonly known as: COREG   fosinopril 40 MG tablet Commonly known as: MONOPRIL   hydrochlorothiazide 25 MG tablet Commonly known as: HYDRODIURIL       TAKE these medications    acetaminophen 325 MG tablet Commonly known as: TYLENOL Take 2 tablets (650 mg total) by mouth every 6 (six) hours as needed for mild pain (or Fever >/= 101).   apixaban 5 MG Tabs tablet Commonly known as: ELIQUIS Take 1 tablet (5 mg total) by mouth 2 (two) times daily.   glimepiride 4 MG tablet Commonly known as: AMARYL Take 1 tablet (4 mg total) by mouth daily with breakfast. What changed: when to take this   metoprolol succinate 25 MG 24 hr tablet Commonly known as: TOPROL-XL Take 0.5 tablets (12.5 mg total) by mouth daily. Start taking on: June 06, 2022   OneTouch Ultra test strip Generic drug: glucose blood USE 1 STRIP TO CHECK GLUCOSE ONCE DAILY Dx: E11.42   Potassium Chloride ER 20 MEQ Tbcr Take 20 mEq by mouth daily.   pravastatin 10 MG tablet Commonly known as: PRAVACHOL Take 10 mg by mouth daily.        Day of Discharge BP (!) 153/52 (BP  Location: Right Arm)   Pulse 60   Temp 98.7 F (37.1 C) (Oral)   Resp 16   Ht 6' (1.829 m)   Wt 78.6 kg   SpO2 99%   BMI 23.50 kg/m   Physical Exam: General: No acute respiratory distress Lungs: Clear to auscultation bilaterally without wheezes or crackles Cardiovascular: Regular rate and rhythm without murmur gallop or rub normal S1 and S2 Abdomen: Nontender, nondistended, soft, bowel sounds positive, no rebound, no ascites, no appreciable mass Extremities: No significant cyanosis, clubbing, or edema bilateral lower extremities  Basic Metabolic Panel: Recent Labs  Lab 06/03/22 1412 06/04/22 0339 06/05/22 0258  NA 135 134* 134*  K 2.4* 2.9* 3.3*  CL 94* 100 105  CO2 '29 28 24  '$ GLUCOSE 116* 66* 192*  BUN '16 14 11  '$ CREATININE 1.50* 1.30* 1.24  CALCIUM 8.3* 7.9* 7.8*  MG 2.1  --  1.8   CBC: Recent Labs  Lab 06/03/22 1412 06/04/22 0339 06/05/22 0258  WBC 4.4 4.6 5.5  NEUTROABS  3.3  --   --   HGB 12.9* 11.2* 10.4*  HCT 37.1* 32.2* 31.2*  MCV 85.3 85.2 86.9  PLT 298 277 253   CBG: Recent Labs  Lab 06/04/22 0807 06/04/22 1145 06/04/22 2144 06/05/22 0621 06/05/22 1118  GLUCAP 98 216* 310* 125* 219*    Time spent in discharge (includes decision making & examination of pt): 35 minutes  06/05/2022, 3:02 PM   Cherene Altes, MD [facility] Office  [phone]

## 2022-06-05 NOTE — Progress Notes (Addendum)
Progress Note  Patient Name: Carl Strong Date of Encounter: 06/05/2022  [facility] Cardiologist: Quay Burow, MD   Subjective   Denies any chest pain or shortness of breath.  Continues to maintain normal sinus rhythm on monitor with heart rates in the 70s while awake and drops down to the 50s with sleep  Inpatient Medications    Scheduled Meds:  apixaban  5 mg Oral BID   insulin aspart  0-6 Units Subcutaneous TID WC   pravastatin  10 mg Oral Daily   sodium chloride flush  3 mL Intravenous Q12H   Continuous Infusions:  dextrose 5 % and 0.9% NaCl 75 mL/hr at 06/04/22 0749   PRN Meds: acetaminophen **OR** [DISCONTINUED] acetaminophen, hydrALAZINE, loperamide, ondansetron **OR** ondansetron (ZOFRAN) IV   Vital Signs    Vitals:   06/04/22 2347 06/04/22 2348 06/05/22 0437 06/05/22 0711  BP: 129/86 (!) 150/56 134/67 (!) 133/50  Pulse:   60 86  Resp:   18 18  Temp:   97.7 F (36.5 C) 98.8 F (37.1 C)  TempSrc:   Oral Oral  SpO2:   100% 98%  Weight:      Height:        Intake/Output Summary (Last 24 hours) at 06/05/2022 0838 Last data filed at 06/05/2022 4627 Gross per 24 hour  Intake --  Output 900 ml  Net -900 ml      06/03/2022    4:45 PM 05/23/2022   10:19 AM 01/07/2017    4:03 AM  Last 3 Weights  Weight (lbs) 173 lb 4.5 oz 173 lb 3.2 oz 188 lb  Weight (kg) 78.6 kg 78.563 kg 85.276 kg      Telemetry    Normal sinus rhythm with PACs- Personally Reviewed  ECG    No new EKG to review- Personally Reviewed  Physical Exam   GEN: No acute distress.   Neck: No JVD Cardiac: RRR, no murmurs, rubs, or gallops.  Respiratory: Clear to auscultation bilaterally. GI: Soft, nontender, non-distended  MS: No edema; No deformity. Neuro:  Nonfocal  Psych: Normal affect   Labs    High Sensitivity Troponin:   Recent Labs  Lab 06/03/22 1412  TROPONINIHS 46*      Chemistry Recent Labs  Lab 06/03/22 1412 06/04/22 0339 06/05/22 0258  NA 135 134* 134*   K 2.4* 2.9* 3.3*  CL 94* 100 105  CO2 '29 28 24  '$ GLUCOSE 116* 66* 192*  BUN '16 14 11  '$ CREATININE 1.50* 1.30* 1.24  CALCIUM 8.3* 7.9* 7.8*  GFRNONAA 44* 52* 55*  ANIONGAP '12 6 5     '$ Hematology Recent Labs  Lab 06/03/22 1412 06/04/22 0339 06/04/22 0904 06/05/22 0258  WBC 4.4 4.6  --  5.5  RBC 4.35 3.78* 4.02* 3.59*  HGB 12.9* 11.2*  --  10.4*  HCT 37.1* 32.2*  --  31.2*  MCV 85.3 85.2  --  86.9  MCH 29.7 29.6  --  29.0  MCHC 34.8 34.8  --  33.3  RDW 12.9 13.2  --  13.2  PLT 298 277  --  253    BNPNo results for input(s): BNP, PROBNP in the last 168 hours.   DDimer No results for input(s): DDIMER in the last 168 hours.   CHA2DS2-VASc Score = 4   This indicates a 4.8% annual risk of stroke. The patient's score is based upon: CHF History: 0 HTN History: 1 Diabetes History: 1 Stroke History: 0 Vascular Disease History: 0 Age Score: 2  Gender Score: 0      Radiology    DG Chest Port 1 View  Result Date: 06/03/2022 CLINICAL DATA:  Orthostasis.  Atrial fibrillation. EXAM: PORTABLE CHEST 1 VIEW COMPARISON:  01/06/2017 and the chest CT of 03/28/2017. FINDINGS: Numerous leads and wires project over the chest. A battery pack projects over the left side of the chest. Midline trachea. Normal heart size. Atherosclerosis in the transverse aorta. No pleural effusion or pneumothorax. Lucency in the apices with lower lung predominant interstitial thickening and coarsening, consistent with the clinical history of smoking/emphysema. No superimposed lobar consolidation. No congestive failure. IMPRESSION: 1. No acute cardiopulmonary disease. 2. Aortic atherosclerosis. Electronically Signed   By: Abigail Miyamoto M.D.   On: 06/03/2022 14:50   ECHOCARDIOGRAM COMPLETE  Result Date: 06/04/2022    ECHOCARDIOGRAM REPORT   Patient Name:   Carl Strong Date of Exam: 06/04/2022 Medical Rec #:  [id number]        Height:       72.0 in Accession #:    [id number]       Weight:       173.3 lb Date of  Birth:  June 26, 1929         BSA:          2.005 m Patient Age:    88 years         BP:           150/58 mmHg Patient Gender: M                HR:           47 bpm. Exam Location:  Inpatient Procedure: 2D Echo, Cardiac Doppler and Color Doppler Indications:    Afib  History:        Patient has no prior history of Echocardiogram examinations.                 Signs/Symptoms:Dizziness/Lightheadedness; Risk                 Factors:Hypertension and Diabetes.  Sonographer:    [name] RCS Referring Phys: [place]  1. Left ventricular ejection fraction, by estimation, is 60 to 65%. The left ventricle has normal function. The left ventricle has no regional wall motion abnormalities. There is mild concentric left ventricular hypertrophy. Left ventricular diastolic parameters are consistent with Grade II diastolic dysfunction (pseudonormalization).  2. Right ventricular systolic function is normal. The right ventricular size is normal.  3. The mitral valve is normal in structure. Mild mitral valve regurgitation. No evidence of mitral stenosis.  4. The aortic valve is normal in structure. Aortic valve regurgitation is not visualized. Aortic valve sclerosis is present, with no evidence of aortic valve stenosis.  5. The inferior vena cava is normal in size with greater than 50% respiratory variability, suggesting right atrial pressure of 3 mmHg. FINDINGS  Left Ventricle: Left ventricular ejection fraction, by estimation, is 60 to 65%. The left ventricle has normal function. The left ventricle has no regional wall motion abnormalities. The left ventricular internal cavity size was normal in size. There is  mild concentric left ventricular hypertrophy. Left ventricular diastolic parameters are consistent with Grade II diastolic dysfunction (pseudonormalization). Right Ventricle: The right ventricular size is normal. No increase in right ventricular wall thickness. Right ventricular systolic function is  normal. Left Atrium: Left atrial size was normal in size. Right Atrium: Right atrial size was normal in size. Pericardium: There is no evidence of pericardial effusion. Presence  of epicardial fat layer. Mitral Valve: The mitral valve is normal in structure. Mild mitral valve regurgitation. No evidence of mitral valve stenosis. Tricuspid Valve: The tricuspid valve is normal in structure. Tricuspid valve regurgitation is not demonstrated. No evidence of tricuspid stenosis. Aortic Valve: The aortic valve is normal in structure. Aortic valve regurgitation is not visualized. Aortic valve sclerosis is present, with no evidence of aortic valve stenosis. Aortic valve mean gradient measures 3.5 mmHg. Aortic valve peak gradient measures 6.8 mmHg. Aortic valve area, by VTI measures 3.13 cm. Pulmonic Valve: The pulmonic valve was normal in structure. Pulmonic valve regurgitation is not visualized. No evidence of pulmonic stenosis. Aorta: The aortic root is normal in size and structure. Venous: The inferior vena cava is normal in size with greater than 50% respiratory variability, suggesting right atrial pressure of 3 mmHg. IAS/Shunts: No atrial level shunt detected by color flow Doppler.  LEFT VENTRICLE PLAX 2D LVIDd:         4.80 cm   Diastology LVIDs:         3.10 cm   LV e' medial:    6.42 cm/s LV PW:         1.00 cm   LV E/e' medial:  17.3 LV IVS:        1.20 cm   LV e' lateral:   8.59 cm/s LVOT diam:     2.10 cm   LV E/e' lateral: 12.9 LV SV:         104 LV SV Index:   52 LVOT Area:     3.46 cm  RIGHT VENTRICLE             IVC RV Basal diam:  3.50 cm     IVC diam: 1.80 cm RV Mid diam:    1.70 cm RV S prime:     17.70 cm/s TAPSE (M-mode): 2.5 cm LEFT ATRIUM             Index        RIGHT ATRIUM           Index LA diam:        5.10 cm 2.54 cm/m   RA Area:     17.30 cm LA Vol (A2C):   75.3 ml 37.56 ml/m  RA Volume:   45.90 ml  22.90 ml/m LA Vol (A4C):   58.8 ml 29.33 ml/m LA Biplane Vol: 67.2 ml 33.52 ml/m  AORTIC  VALVE                    PULMONIC VALVE AV Area (Vmax):    2.97 cm     PV Vmax:       1.24 m/s AV Area (Vmean):   3.03 cm     PV Peak grad:  6.2 mmHg AV Area (VTI):     3.13 cm AV Vmax:           130.50 cm/s AV Vmean:          90.800 cm/s AV VTI:            0.332 m AV Peak Grad:      6.8 mmHg AV Mean Grad:      3.5 mmHg LVOT Vmax:         112.00 cm/s LVOT Vmean:        79.400 cm/s LVOT VTI:          0.300 m LVOT/AV VTI ratio: 0.90  AORTA Ao Root diam: 3.40 cm Ao Asc  diam:  3.30 cm MITRAL VALVE                  TRICUSPID VALVE MV Area (PHT): 4.63 cm       TR Peak grad:   28.5 mmHg MV Decel Time: 164 msec       TR Vmax:        267.00 cm/s MR Peak grad:    96.4 mmHg MR Vmax:         491.00 cm/s  SHUNTS MR PISA:         0.57 cm     Systemic VTI:  0.30 m MR PISA Eff ROA: 4 mm        Systemic Diam: 2.10 cm MR PISA Radius:  0.30 cm MV E velocity: 111.00 cm/s MV A velocity: 62.60 cm/s MV E/A ratio:  1.77 Kardie Tobb DO Electronically signed by Berniece Salines DO Signature Date/Time: 06/04/2022/5:27:55 PM    Final     Cardiac Studies   2D echo 06/04/2022 IMPRESSIONS    1. Left ventricular ejection fraction, by estimation, is 60 to 65%. The  left ventricle has normal function. The left ventricle has no regional  wall motion abnormalities. There is mild concentric left ventricular  hypertrophy. Left ventricular diastolic  parameters are consistent with Grade II diastolic dysfunction  (pseudonormalization).   2. Right ventricular systolic function is normal. The right ventricular  size is normal.   3. The mitral valve is normal in structure. Mild mitral valve  regurgitation. No evidence of mitral stenosis.   4. The aortic valve is normal in structure. Aortic valve regurgitation is  not visualized. Aortic valve sclerosis is present, with no evidence of  aortic valve stenosis.   5. The inferior vena cava is normal in size with greater than 50%  respiratory variability, suggesting right atrial pressure of 3  mmHg.   Patient Profile     88 y.o. male with a hx of HLD, HTN, type 2 DM, atrial fibrillation, remote prostate cancer who is being seen 06/04/2022 for the evaluation of atrial fibrillation with RVR at the request of Dr. Thereasa Solo.  Assessment & Plan    Atrial Fibrillation   - Patient in sinus bradycardia with PACs on arrival to ER and has been maintaining sinus bradycardia since - First diagnosed with afib on 5/24-- was started on carvedilol and eliquis at that time  - Continue Eliquis 5 mg twice daily - Carvedilol stopped due to bradycardia and possible potentiation of orthostatic hypotension leading to dizziness  - Heart rate on telemetry now in the 70s with very frequent PACs so we will start low-dose Toprol-XL 12.5 mg daily and follow heart rate on telemetry  Dizziness  - Patient complains of dizziness upon standing, feels like he is going to pass out. Denies feeling like the room is spinning  - 2D echo shows normal LV function - Carotid Dopplers are pending - Patient was taking carvedilol, fosinopril, HCTZ prior to admission. Question if patient's HTN was over-treated and he was having orthostatic hypotension  - I have ordered orthostatic vital signs several times and still have not been done so I have placed another order for them to be done today - Dizziness could also be secondary to diarrhea, hypokalemia   - Patient has received some IV fluids this admission, symptoms have improved  - Carvedilol was stopped due to bradycardia and fosinopril and HCTZ were stopped due to AKI   AKI  - Creatinine elevated to 1.5 on admission,  improved to 1.2 today - Given IV fluids with improvement in dizziness/weakness   HTN  - PTA, patient was on fosinopril, carvedilol, and HCTZ  - Fosinopril and HCTZ held in the setting of AKI - Carvedilol stopped due to bradycardia - BP is now elevated and serum creatinine has normalized - I am restarting very low-dose beta-blocker but will avoid carvedilol  which has alpha blockade effects and can potentiate orthostatic hypotension.  Will start with Toprol-XL 12.5 mg daily for suppression of PACs and PAF as well as BP control -Could consider addition of amlodipine for blood pressure control if needed  HLD  - Continue pravastatin   Hypokalemia -Potassium 3.3 today despite 40 mEq of K-Dur 3 times yesterday -give K-Dur 40 mEq today and repeat bmet in a.m.  I have spent a total of 35 minutes with patient reviewing 2D echo , telemetry, EKGs, labs and examining patient as well as establishing an assessment and plan that was discussed with the patient.  > 50% of time was spent in direct patient care.     For questions or updates, please contact [place] Please consult [email or web address] for contact info under        Signed, Fransico Him, MD  06/05/2022, 8:38 AM

## 2022-06-05 NOTE — Discharge Instructions (Addendum)
PLEASE NOTE YOUR COREG (carvedilol) HAS BEEN DISCONTINUED. Do not take this medication.  PLEASE NOTE THE DOSE OF YOUR AMARYL (diabetes pill) has been DECREASED to ONCE A DAY. Check your blood sugars and if the numbers are consistently < 70 stop taking amaryl all together. If the numbers are consistently > 200 contact your Primary Care Provider for more advice.   It is our suspicion that your symptoms at presentation were due to a low heart rate caused by Coreg as well as dehydration from the diarrhea that was also likely due to Coreg.  This medication has been discontinued, and a new milder replacement has been prescribed (Toprol).  It will be important for you to make note of your heart rate and your blood sugars at home and report these values to your primary care physician.  It will also be important for you to follow-up with Dr. Alvester Chou the cardiologist in 10 to 14 days.  2 other medications you are taking for blood pressure have also been discontinued -lisinopril and hydrochlorothiazide.  Monitor your blood pressures at home.  We will be happy to except a blood pressure consistently anywhere between 211 and 941 systolic (the top number).  It will be important for you to follow-up with your primary care physician for recheck of your blood pressure.  You were also found to be low on potassium during this hospital stay, likely due to diarrhea as well as your diuretic.  For now you should resume taking potassium pills and have your potassium level checked by your primary care physician in follow-up.

## 2022-06-05 NOTE — TOC Transition Note (Signed)
Transition of Care (TOC) - CM/SW Discharge Note Marvetta Gibbons RN, BSN Transitions of Care Unit 4E- RN Case Manager See Treatment Team for direct phone #    Patient Details  Name: Carl Strong MRN: [id number] Date of Birth: [birth year]  [facility] CM/SW Contact:  Dawayne Patricia, RN Phone Number: 06/05/2022, 2:59 PM   Clinical Narrative:    Transition of Care Department [facility]) has reviewed patient and no TOC needs have been identified at this time. PT/OT evals completed with no f/u recommendations. Pt stable for transition home.     Final next level of care: Home/Self Care Barriers to Discharge: No Barriers Identified   Patient Goals and CMS Choice     Choice offered to / list presented to : NA  Discharge Placement                 Home      Discharge Plan and Services     Post Acute Care Choice: NA                               Social Determinants of Health (SDOH) Interventions     Readmission Risk Interventions     View : No data to display.

## 2022-06-05 NOTE — Progress Notes (Signed)
Pt to be d/c from 4E to home. D/c information and med education provided, pt expresses understanding. Tele and IV removed.   Raelyn Number, RN

## 2022-06-05 NOTE — Progress Notes (Signed)
Initial Nutrition Assessment  DOCUMENTATION CODES:   Non-severe (moderate) malnutrition in context of chronic illness  INTERVENTION:  Encourage PO intake Ensure Enlive po BID, each supplement provides 350 kcal and 20 grams of protein. MVI with minerals daily  NUTRITION DIAGNOSIS:   Moderate Malnutrition related to chronic illness (DM, afib, HTN, HLD, advanced age) as evidenced by mild fat depletion, moderate muscle depletion, mild muscle depletion.  GOAL:   Patient will meet greater than or equal to 90% of their needs  MONITOR:   PO intake, Supplement acceptance  REASON FOR ASSESSMENT:   Consult  (nutrition goals)  ASSESSMENT:   Pt admitted with dizziness. PMH significant for HLD, HTN, DM, afib, and remote prostate CA.  Pt reports consistent PO intake PTA. He lives at home alone. Cooks himself breakfast including eggs, bacon, biscuit and gravy with coffee. Lunch may include a frozen meal which he prepares in the oven and then snacks throughout the afternoon. Denies any changes to his intake despite reports of wt loss.   Pt states that he has lost wt within the last couple months and has caused his dentures to fit more loosely. He is ordering soft/easy to chew foods and prefers not to have a modified diet for ability to choose foods he enjoys. Pt previously was drinking Glucerna at home but has stopped d/t cost and primarily drinks water.  Pt reports wt loss within the last couple months from 180 lbs to 169 lbs. Unfortunately, unable to confirm this wt loss as there is limited documentation of wt's within the last year.   Medications: SSI 0-6 units TID, KLOR-CON, NaCl flush  Labs: sodium 134, potassium 3.3, calcium 7.8, GFR 55, HgbA1c 6.9%, CBG's 64-310 x24 hours  NUTRITION - FOCUSED PHYSICAL EXAM:  Flowsheet Row Most Recent Value  Orbital Region Moderate depletion  Upper Arm Region Mild depletion  Thoracic and Lumbar Region No depletion  Buccal Region Mild depletion   Temple Region Moderate depletion  Clavicle Bone Region Mild depletion  Clavicle and Acromion Bone Region No depletion  Scapular Bone Region No depletion  Dorsal Hand Mild depletion  Patellar Region Mild depletion  Anterior Thigh Region Moderate depletion  Posterior Calf Region Moderate depletion  Edema (RD Assessment) None  Hair Reviewed  Eyes Reviewed  Mouth Other (Comment)  [wears dentures]  Skin Reviewed  Nails Reviewed       Diet Order:   Diet Order             Diet regular Room service appropriate? Yes; Fluid consistency: Thin  Diet effective now                   EDUCATION NEEDS:   Education needs have been addressed  Skin:  Skin Assessment: Reviewed RN Assessment  Last BM:  6/6  Height:   Ht Readings from Last 1 Encounters:  06/03/22 6' (1.829 m)    Weight:   Wt Readings from Last 1 Encounters:  06/03/22 78.6 kg   BMI:  Body mass index is 23.5 kg/m.  Estimated Nutritional Needs:   Kcal:  2000-2200  Protein:  100-115g  Fluid:  >/=2L  Clayborne Dana, RDN, LDN Clinical Nutrition

## 2022-06-06 ENCOUNTER — Other Ambulatory Visit (HOSPITAL_COMMUNITY): Payer: Self-pay

## 2022-06-07 ENCOUNTER — Other Ambulatory Visit (HOSPITAL_COMMUNITY): Payer: Self-pay

## 2022-06-08 DIAGNOSIS — N1831 Chronic kidney disease, stage 3a: Secondary | ICD-10-CM | POA: Diagnosis not present

## 2022-06-08 DIAGNOSIS — R42 Dizziness and giddiness: Secondary | ICD-10-CM | POA: Diagnosis not present

## 2022-06-08 DIAGNOSIS — R5383 Other fatigue: Secondary | ICD-10-CM | POA: Diagnosis not present

## 2022-06-08 DIAGNOSIS — R001 Bradycardia, unspecified: Secondary | ICD-10-CM | POA: Diagnosis not present

## 2022-06-11 ENCOUNTER — Other Ambulatory Visit (HOSPITAL_COMMUNITY): Payer: Self-pay

## 2022-06-15 ENCOUNTER — Ambulatory Visit (HOSPITAL_COMMUNITY): Payer: Medicare Other | Attending: Cardiology

## 2022-06-15 DIAGNOSIS — I480 Paroxysmal atrial fibrillation: Secondary | ICD-10-CM | POA: Insufficient documentation

## 2022-06-15 DIAGNOSIS — I10 Essential (primary) hypertension: Secondary | ICD-10-CM | POA: Diagnosis not present

## 2022-06-15 DIAGNOSIS — E782 Mixed hyperlipidemia: Secondary | ICD-10-CM | POA: Diagnosis not present

## 2022-06-15 LAB — ECHOCARDIOGRAM COMPLETE
Area-P 1/2: 3.06 cm2
S' Lateral: 2.9 cm

## 2022-06-21 ENCOUNTER — Ambulatory Visit: Payer: Medicare Other | Admitting: Cardiovascular Disease

## 2022-06-22 ENCOUNTER — Ambulatory Visit: Payer: Medicare Other | Admitting: Adult Health

## 2022-06-22 ENCOUNTER — Encounter: Payer: Self-pay | Admitting: Adult Health

## 2022-06-22 VITALS — BP 187/63 | HR 52 | Ht 71.0 in | Wt 170.0 lb

## 2022-06-22 DIAGNOSIS — I10 Essential (primary) hypertension: Secondary | ICD-10-CM

## 2022-06-22 DIAGNOSIS — E7800 Pure hypercholesterolemia, unspecified: Secondary | ICD-10-CM | POA: Diagnosis not present

## 2022-06-22 DIAGNOSIS — I5032 Chronic diastolic (congestive) heart failure: Secondary | ICD-10-CM

## 2022-06-22 DIAGNOSIS — I480 Paroxysmal atrial fibrillation: Secondary | ICD-10-CM

## 2022-06-22 DIAGNOSIS — N179 Acute kidney failure, unspecified: Secondary | ICD-10-CM

## 2022-06-22 MED ORDER — AMLODIPINE BESYLATE 5 MG PO TABS: 5.0000 mg | ORAL_TABLET | Freq: Every day | ORAL | 2 refills | Status: DC

## 2022-06-23 LAB — CBC
Hematocrit: 38.9 % (ref 37.5–51.0)
Hemoglobin: 12.7 g/dL — ABNORMAL LOW (ref 13.0–17.7)
MCH: 28.3 pg (ref 26.6–33.0)
MCHC: 32.6 g/dL (ref 31.5–35.7)
MCV: 87 fL (ref 79–97)
Platelets: 248 10*3/uL (ref 150–450)
RBC: 4.48 x10E6/uL (ref 4.14–5.80)
RDW: 13.6 % (ref 11.6–15.4)
WBC: 5.5 10*3/uL (ref 3.4–10.8)

## 2022-06-23 LAB — BASIC METABOLIC PANEL
BUN/Creatinine Ratio: 14 (ref 10–24)
BUN: 18 mg/dL (ref 10–36)
CO2: 25 mmol/L (ref 20–29)
Calcium: 8.8 mg/dL (ref 8.6–10.2)
Chloride: 101 mmol/L (ref 96–106)
Creatinine, Ser: 1.29 mg/dL — ABNORMAL HIGH (ref 0.76–1.27)
Glucose: 177 mg/dL — ABNORMAL HIGH (ref 70–99)
Potassium: 3.9 mmol/L (ref 3.5–5.2)
Sodium: 138 mmol/L (ref 134–144)
eGFR: 52 mL/min/{1.73_m2} — ABNORMAL LOW (ref >59)

## 2022-06-25 DIAGNOSIS — H52223 Regular astigmatism, bilateral: Secondary | ICD-10-CM | POA: Diagnosis not present

## 2022-06-26 ENCOUNTER — Telehealth: Payer: Self-pay

## 2022-06-29 DIAGNOSIS — E1142 Type 2 diabetes mellitus with diabetic polyneuropathy: Secondary | ICD-10-CM | POA: Diagnosis not present

## 2022-06-29 DIAGNOSIS — I129 Hypertensive chronic kidney disease with stage 1 through stage 4 chronic kidney disease, or unspecified chronic kidney disease: Secondary | ICD-10-CM | POA: Diagnosis not present

## 2022-06-29 DIAGNOSIS — N1831 Chronic kidney disease, stage 3a: Secondary | ICD-10-CM | POA: Diagnosis not present

## 2022-06-29 DIAGNOSIS — I480 Paroxysmal atrial fibrillation: Secondary | ICD-10-CM | POA: Diagnosis not present

## 2022-07-19 NOTE — Progress Notes (Signed)
Cardiology Clinic Note   Patient Name: Carl Strong Date of Encounter: 07/20/2022  Primary Care Provider:  Lajean Manes, MD Primary Cardiologist:  Quay Burow, MD  Patient Profile    88 year old male patient who lives independently in his own home with history of hypertension, type 2 diabetes, atrial fibrillation, grade 2 diastolic dysfunction, remote history of prostate cancer, and hyperlipidemia, and recently diagnosed atrial fib, on 05/23/2022 and started on Eliquis with a CHA2DS2-VASc score of 4.  He was admitted in June 2023 due to bradycardia and hypotension and therefore rate control with carvedilol was discontinued and he was to be started on low-dose metoprolol XL 12.5 mg daily.  Past Medical History    Past Medical History:  Diagnosis Date   Arthritis    "left hand" (01/08/2017)   GERD (gastroesophageal reflux disease)    High cholesterol    History of kidney stones    Hypertension    Osteopenia    PAF (paroxysmal atrial fibrillation) ([place])    Prostate cancer ([place]) 2005   Type II diabetes mellitus ([place])    Past Surgical History:  Procedure Laterality Date   CARPAL TUNNEL RELEASE Right 06/28/2016   Procedure: RIGHT CARPAL TUNNEL RELEASE;  Surgeon: Daryll Brod, MD;  Location: [facility];  Service: Orthopedics;  Laterality: Right;  ANESTHESIA:  IV REGIONAL FAB   CYSTOSCOPY W/ STONE MANIPULATION     LAPAROSCOPIC CHOLECYSTECTOMY     PROSTATECTOMY  04/2004   Archie Endo 05/16/2011   TONSILLECTOMY     URETHRAL STRICTURE DILATATION  07/2005   Archie Endo 05/16/2011    Allergies  Allergies  Allergen Reactions   Atorvastatin     Other reaction(s): muscle pain   Crestor [Rosuvastatin]     Other reaction(s): muscle pain   Empagliflozin     Other reaction(s): weakness, nausea and vomiting   Ezetimibe     Other reaction(s): Unknown    History of Present Illness    Mr. Lipscomb returns today for ongoing assessment and management of atrial fibrillation,  diastolic heart failure, and hypertension.  On last office visit on 06/22/2022 blood pressure was elevated at 195/76 to 181/63, negative orthostatics.  He was started on amlodipine 5 mg daily for better blood pressure control.  He is here for follow-up concerning his response to medications.  He was to bring with him a list of his blood pressure recordings from home.  No other medications were changed.  He was continued on metoprolol 12.5 mg daily.  Lab work was completed and there was no evidence of anemia or hypokalemia.  He denies any issues with institution of amlodipine with the exception of some mild lower extremity edema in the dependent position by the end of the day.  He has been recording his blood pressures which have improved significantly since starting the amlodipine.  Averaging around 145/88.  Today's blood pressure on triage was 140/76.  He denies any dizziness, near syncope, or fatigue associated with lower blood pressure.  Home Medications    Current Outpatient Medications  Medication Sig Dispense Refill   acetaminophen (TYLENOL) 325 MG tablet Take 2 tablets (650 mg total) by mouth every 6 (six) hours as needed for mild pain (or Fever >/= 101).     apixaban (ELIQUIS) 5 MG TABS tablet Take 1 tablet (5 mg total) by mouth 2 (two) times daily. 60 tablet 0   apixaban (ELIQUIS) 5 MG TABS tablet Take 1 tablet (5 mg total) by mouth 2 (two) times daily. 56 tablet 0  glimepiride (AMARYL) 4 MG tablet Take 1 tablet (4 mg total) by mouth daily with breakfast. 30 tablet    glucose blood (ONETOUCH ULTRA) test strip USE 1 STRIP TO CHECK GLUCOSE ONCE DAILY Dx: E11.42     hydrochlorothiazide (MICROZIDE) 12.5 MG capsule Take 1 capsule (12.5 mg total) by mouth as needed. For swelling and edema 30 capsule 2   metoprolol succinate (TOPROL-XL) 25 MG 24 hr tablet Take 0.5 tablets (12.5 mg total) by mouth daily. 15 tablet 1   Potassium Chloride ER 20 MEQ TBCR Take 20 mEq by mouth daily.     pravastatin  (PRAVACHOL) 10 MG tablet Take 10 mg by mouth daily.     amLODipine (NORVASC) 5 MG tablet Take 1 tablet (5 mg total) by mouth daily. 30 tablet 2   No current facility-administered medications for this visit.     Family History    No family history on file. has no family status information on file.   Social History    Social History   Socioeconomic History   Marital status: Widowed    Spouse name: Not on file   Number of children: Not on file   Years of education: Not on file   Highest education level: Not on file  Occupational History   Occupation: retired Administrator  Tobacco Use   Smoking status: Former    Packs/day: 2.00    Years: 43.00    Total pack years: 86.00    Types: Cigarettes    Quit date: 02/13/1991    Years since quitting: 31.4   Smokeless tobacco: Never  Substance and Sexual Activity   Alcohol use: No   Drug use: No   Sexual activity: Not on file  Other Topics Concern   Not on file  Social History Narrative   Not on file   Social Determinants of Health   Financial Resource Strain: Not on file  Food Insecurity: Not on file  Transportation Needs: Not on file  Physical Activity: Not on file  Stress: Not on file  Social Connections: Not on file  Intimate Partner Violence: Not on file     Review of Systems    General:  No chills, fever, night sweats or weight changes.  Cardiovascular:  No chest pain, dyspnea on exertion, positive for dependent lower extremity edema, orthopnea, palpitations, paroxysmal nocturnal dyspnea. Dermatological: No rash, lesions/masses Respiratory: No cough, dyspnea Urologic: No hematuria, dysuria Abdominal:   No nausea, vomiting, diarrhea, bright red blood per rectum, melena, or hematemesis Neurologic:  No visual changes, wkns, changes in mental status. All other systems reviewed and are otherwise negative except as noted above.     Physical Exam    VS:  BP 140/76   Pulse 69   Ht '5\' 11"'$  (1.803 m)   Wt 179 lb 9.6 oz  (81.5 kg)   SpO2 96%   BMI 25.05 kg/m  , BMI Body mass index is 25.05 kg/m.     GEN: Well nourished, well developed, in no acute distress. HEENT: normal. Neck: Supple, no JVD, carotid bruits, or masses. Cardiac: IRRR, no murmurs, rubs, or gallops. No clubbing, cyanosis, dependent lower extremity edema, left greater than right.  Radials/DP/PT 2+ and equal bilaterally.  Respiratory:  Respirations regular and unlabored, clear to auscultation bilaterally. GI: Soft, nontender, nondistended, BS + x 4. MS: no deformity or atrophy. Skin: warm and dry, no rash. Neuro:  Strength and sensation are intact. Psych: Normal affect.  Accessory Clinical Findings    Lab Results  Component Value Date   WBC 5.5 06/22/2022   HGB 12.7 (L) 06/22/2022   HCT 38.9 06/22/2022   MCV 87 06/22/2022   PLT 248 06/22/2022   Lab Results  Component Value Date   CREATININE 1.29 (H) 06/22/2022   BUN 18 06/22/2022   NA 138 06/22/2022   K 3.9 06/22/2022   CL 101 06/22/2022   CO2 25 06/22/2022   Lab Results  Component Value Date   ALT 22 01/06/2017   AST 31 01/06/2017   ALKPHOS 49 01/06/2017   BILITOT 0.3 01/06/2017   No results found for: "CHOL", "HDL", "LDLCALC", "LDLDIRECT", "TRIG", "CHOLHDL"  Lab Results  Component Value Date   HGBA1C 6.9 (H) 06/05/2022    Review of Prior Studies: 2D echo 06/04/2022 IMPRESSIONS    1. Left ventricular ejection fraction, by estimation, is 60 to 65%. The  left ventricle has normal function. The left ventricle has no regional  wall motion abnormalities. There is mild concentric left ventricular  hypertrophy. Left ventricular diastolic  parameters are consistent with Grade II diastolic dysfunction  (pseudonormalization).   2. Right ventricular systolic function is normal. The right ventricular  size is normal.   3. The mitral valve is normal in structure. Mild mitral valve  regurgitation. No evidence of mitral stenosis.   4. The aortic valve is normal in  structure. Aortic valve regurgitation is  not visualized. Aortic valve sclerosis is present, with no evidence of  aortic valve stenosis.   5. The inferior vena cava is normal in size with greater than 50%  respiratory variability, suggesting right atrial pressure of 3 mmHg.   Assessment & Plan   1.  Hypertension: Much improved with addition of amlodipine 5 mg daily.  We will continue metoprolol 12.5 mg daily as directed.  I will give him HCTZ 12.5 mg to use as needed significant lower extremity edema.  He is to avoid salt.  He is to buy compression support hose which she can get from [facility] to help with this dependent edema.  He does have a follow-up appointment with Dr. Gwenlyn Found in October 2023 which he will keep.   2.  Paroxysmal atrial fibrillation: Heart rate is well controlled on metoprolol.  He continues on Eliquis without evidence of bleeding or melena.  Most recent creatinine 1.29.  Samples are provided to assist with cost as he states that his out-of-pocket is difficult for him to meet..  3.  Diabetes with complications of hypertension: Followed by PCP.  4.  Hyperlipidemia: Remains on pravastatin 10 mg daily with goal of LDL less than 70 with multiple cardiovascular risk factors. Current medicines are reviewed at length with the patient today.  I have spent 25  min's  dedicated to the care of this patient on the date of this encounter to include pre-visit review of records, assessment, management and diagnostic testing,with shared decision making.  Signed, Phill Myron. West Pugh, ANP, AACC   07/20/2022 2:29 PM    [place] [address] Office [phone] Fax [phone]  Notice: This dictation was prepared with Dragon dictation along with smaller phrase technology. Any transcriptional errors that result from this process are unintentional and may not be corrected upon review.

## 2022-07-20 ENCOUNTER — Encounter: Payer: Self-pay | Admitting: Adult Health

## 2022-07-20 ENCOUNTER — Ambulatory Visit: Payer: Medicare Other | Admitting: Adult Health

## 2022-07-20 VITALS — BP 140/76 | HR 69 | Ht 71.0 in | Wt 179.6 lb

## 2022-07-20 DIAGNOSIS — E119 Type 2 diabetes mellitus without complications: Secondary | ICD-10-CM

## 2022-07-20 DIAGNOSIS — I10 Essential (primary) hypertension: Secondary | ICD-10-CM

## 2022-07-20 DIAGNOSIS — I480 Paroxysmal atrial fibrillation: Secondary | ICD-10-CM | POA: Diagnosis not present

## 2022-07-20 DIAGNOSIS — E7800 Pure hypercholesterolemia, unspecified: Secondary | ICD-10-CM | POA: Diagnosis not present

## 2022-07-20 MED ORDER — HYDROCHLOROTHIAZIDE 12.5 MG PO CAPS: 12.5000 mg | ORAL_CAPSULE | ORAL | 2 refills | Status: DC | PRN

## 2022-07-20 MED ORDER — AMLODIPINE BESYLATE 5 MG PO TABS: 5.0000 mg | ORAL_TABLET | Freq: Every day | ORAL | 2 refills | Status: DC

## 2022-07-20 MED ORDER — APIXABAN 5 MG PO TABS: 5.0000 mg | ORAL_TABLET | Freq: Two times a day (BID) | ORAL | 0 refills | Status: DC

## 2022-07-20 NOTE — Patient Instructions (Signed)
Medication Instructions:  Start HCTZ 12.5 mg ( Take 1 Tablet As Needed). *If you need a refill on your cardiac medications before your next appointment, please call your pharmacy*   Lab Work: No Labs If you have labs (blood work) drawn today and your tests are completely normal, you will receive your results only by: [place] (if you have MyChart) OR A paper copy in the mail If you have any lab test that is abnormal or we need to change your treatment, we will call you to review the results.   Testing/Procedures: No Testing   Follow-Up: At [facility], you and your health needs are our priority.  As part of our continuing mission to provide you with exceptional heart care, we have created designated Provider Care Teams.  These Care Teams include your primary Cardiologist (physician) and Advanced Practice Providers (APPs -  Physician Assistants and Nurse Practitioners) who all work together to provide you with the care you need, when you need it.  We recommend signing up for the patient portal called "MyChart".  Sign up information is provided on this After Visit Summary.  MyChart is used to connect with patients for Virtual Visits (Telemedicine).  Patients are able to view lab/test results, encounter notes, upcoming appointments, etc.  Non-urgent messages can be sent to your provider as well.   To learn more about what you can do with MyChart, go to [email or web address].    Your next appointment:   Keep Scheduled Follow up Appointment  The format for your next appointment:   In Person  Provider:   Quay Burow, MD       Important Information About Sugar

## 2022-08-21 DIAGNOSIS — E1142 Type 2 diabetes mellitus with diabetic polyneuropathy: Secondary | ICD-10-CM | POA: Diagnosis not present

## 2022-08-21 DIAGNOSIS — N1831 Chronic kidney disease, stage 3a: Secondary | ICD-10-CM | POA: Diagnosis not present

## 2022-08-21 DIAGNOSIS — E1121 Type 2 diabetes mellitus with diabetic nephropathy: Secondary | ICD-10-CM | POA: Diagnosis not present

## 2022-08-21 DIAGNOSIS — I129 Hypertensive chronic kidney disease with stage 1 through stage 4 chronic kidney disease, or unspecified chronic kidney disease: Secondary | ICD-10-CM | POA: Diagnosis not present

## 2022-09-10 DIAGNOSIS — Z79899 Other long term (current) drug therapy: Secondary | ICD-10-CM | POA: Diagnosis not present

## 2022-09-10 DIAGNOSIS — R319 Hematuria, unspecified: Secondary | ICD-10-CM | POA: Diagnosis not present

## 2022-09-17 ENCOUNTER — Other Ambulatory Visit: Payer: Self-pay | Admitting: Adult Health

## 2022-09-24 DIAGNOSIS — N390 Urinary tract infection, site not specified: Secondary | ICD-10-CM | POA: Diagnosis not present

## 2022-09-27 DIAGNOSIS — R399 Unspecified symptoms and signs involving the genitourinary system: Secondary | ICD-10-CM | POA: Diagnosis not present

## 2022-10-02 ENCOUNTER — Encounter: Payer: Self-pay | Admitting: Cardiovascular Disease

## 2022-10-02 ENCOUNTER — Ambulatory Visit: Payer: Medicare Other | Attending: Cardiovascular Disease | Admitting: Cardiovascular Disease

## 2022-10-02 DIAGNOSIS — I10 Essential (primary) hypertension: Secondary | ICD-10-CM | POA: Diagnosis not present

## 2022-10-02 DIAGNOSIS — I480 Paroxysmal atrial fibrillation: Secondary | ICD-10-CM

## 2022-10-02 DIAGNOSIS — E782 Mixed hyperlipidemia: Secondary | ICD-10-CM

## 2022-10-02 NOTE — Assessment & Plan Note (Signed)
History of essential hypertension with blood pressure measured at 160/50.  He states his blood pressure at home usually measures 140/50.  He is on amlodipine, hydrochlorothiazide and metoprolol.

## 2022-10-02 NOTE — Assessment & Plan Note (Signed)
History of hyperlipidemia on statin therapy with lipid profile performed/5/23 revealing total cholesterol 128, LDL 76 and HDL of 35.

## 2022-10-02 NOTE — Assessment & Plan Note (Signed)
History of persistent A-fib rate controlled on Eliquis oral anticoagulation. 

## 2022-10-02 NOTE — Patient Instructions (Signed)
  Follow-Up: At [facility], you and your health needs are our priority.  As part of our continuing mission to provide you with exceptional heart care, we have created designated Provider Care Teams.  These Care Teams include your primary Cardiologist (physician) and Advanced Practice Providers (APPs -  Physician Assistants and Nurse Practitioners) who all work together to provide you with the care you need, when you need it.  We recommend signing up for the patient portal called "MyChart".  Sign up information is provided on this After Visit Summary.  MyChart is used to connect with patients for Virtual Visits (Telemedicine).  Patients are able to view lab/test results, encounter notes, upcoming appointments, etc.  Non-urgent messages can be sent to your provider as well.   To learn more about what you can do with MyChart, go to [email or web address].    Your next appointment:   6 month(s)  The format for your next appointment:   In Person  Provider:   Jory Sims, DNP, ANP    Then, Quay Burow, MD will plan to see you again in 12 month(s).

## 2022-10-02 NOTE — Progress Notes (Signed)
10/02/2022 Carl Strong   [birth year]  [id number]  Primary Physician Lajean Manes, MD Primary Cardiologist: Lorretta Harp MD Lupe Carney, [place]  HPI:  Carl Strong is a 88 y.o.  thin-appearing widowed Caucasian male father of 2 referred by Marcia Brash PA at Dr. Carlyle Lipa office because of newly recognized A-fib.  He is accompanied by his stepson Brayton Layman.  I last saw him in the office 05/23/2022.  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  He smoked remotely.  He does have a history of hypertension, diabetes and hyperlipidemia.  Is never had a heart attack or stroke.  He denies chest pain or shortness of breath.  He lives alone with his cat and does yard work.  He was noted to be in new onset A-fib with rapid ventricular response yesterday and his amlodipine was changed to carvedilol for rate control.  He still feels somewhat lightheaded.  I began him on Eliquis oral anticoagulation.  Since I saw him 5 months ago he was hospitalized for dizziness 06/05/2022 and was seen by Dr. Radford Pax.  His carvedilol was changed to metoprolol.  He does get occasional dizziness which I do not think is related to his A-fib or antihypertensive medications.  He denies chest pain or shortness of breath.  Current Meds  Medication Sig   amLODipine (NORVASC) 5 MG tablet TAKE 1 TABLET(5 MG) BY MOUTH DAILY   apixaban (ELIQUIS) 5 MG TABS tablet Take 1 tablet (5 mg total) by mouth 2 (two) times daily.   glimepiride (AMARYL) 4 MG tablet Take 1 tablet (4 mg total) by mouth daily with breakfast.   glucose blood (ONETOUCH ULTRA) test strip USE 1 STRIP TO CHECK GLUCOSE ONCE DAILY Dx: E11.42   hydrochlorothiazide (MICROZIDE) 12.5 MG capsule Take 1 capsule (12.5 mg total) by mouth as needed. For swelling and edema   Potassium Chloride ER 20 MEQ TBCR Take 20 mEq by mouth daily.   pravastatin (PRAVACHOL) 10 MG tablet Take 10 mg by mouth daily.     Allergies  Allergen Reactions   Atorvastatin      Other reaction(s): muscle pain   Crestor [Rosuvastatin]     Other reaction(s): muscle pain   Empagliflozin     Other reaction(s): weakness, nausea and vomiting   Ezetimibe     Other reaction(s): Unknown    Social History   Socioeconomic History   Marital status: Widowed    Spouse name: Not on file   Number of children: Not on file   Years of education: Not on file   Highest education level: Not on file  Occupational History   Occupation: retired Administrator  Tobacco Use   Smoking status: Former    Packs/day: 2.00    Years: 43.00    Total pack years: 86.00    Types: Cigarettes    Quit date: 02/13/1991    Years since quitting: 31.6   Smokeless tobacco: Never  Substance and Sexual Activity   Alcohol use: No   Drug use: No   Sexual activity: Not on file  Other Topics Concern   Not on file  Social History Narrative   Not on file   Social Determinants of Health   Financial Resource Strain: Not on file  Food Insecurity: Not on file  Transportation Needs: Not on file  Physical Activity: Not on file  Stress: Not on file  Social Connections: Not on file  Intimate Partner Violence: Not on file  Review of Systems: General: negative for chills, fever, night sweats or weight changes.  Cardiovascular: negative for chest pain, dyspnea on exertion, edema, orthopnea, palpitations, paroxysmal nocturnal dyspnea or shortness of breath Dermatological: negative for rash Respiratory: negative for cough or wheezing Urologic: negative for hematuria Abdominal: negative for nausea, vomiting, diarrhea, bright red blood per rectum, melena, or hematemesis Neurologic: negative for visual changes, syncope, or dizziness All other systems reviewed and are otherwise negative except as noted above.    Blood pressure (!) 160/50, pulse 64, height '5\' 11"'$  (1.803 m), weight 176 lb (79.8 kg), SpO2 93 %.  General appearance: alert and no distress Neck: no adenopathy, no carotid bruit, no JVD,  supple, symmetrical, trachea midline, and thyroid not enlarged, symmetric, no tenderness/mass/nodules Lungs: clear to auscultation bilaterally Heart: irregularly irregular rhythm Extremities: extremities normal, atraumatic, no cyanosis or edema Pulses: 2+ and symmetric Skin: Skin color, texture, turgor normal. No rashes or lesions Neurologic: Grossly normal  EKG not performed today  ASSESSMENT AND PLAN:   Hypertension History of essential hypertension with blood pressure measured at 160/50.  He states his blood pressure at home usually measures 140/50.  He is on amlodipine, hydrochlorothiazide and metoprolol.  Hyperlipidemia History of hyperlipidemia on statin therapy with lipid profile performed/5/23 revealing total cholesterol 128, LDL 76 and HDL of 35.  Paroxysmal atrial fibrillation (HCC) History of persistent A-fib rate controlled on Eliquis oral anticoagulation.     Lorretta Harp MD FACP,FACC,FAHA, FSCAI 10/02/2022 1:28 PM

## 2022-10-31 DIAGNOSIS — I4891 Unspecified atrial fibrillation: Secondary | ICD-10-CM | POA: Diagnosis not present

## 2022-11-07 DIAGNOSIS — H2511 Age-related nuclear cataract, right eye: Secondary | ICD-10-CM | POA: Diagnosis not present

## 2022-11-07 DIAGNOSIS — H02831 Dermatochalasis of right upper eyelid: Secondary | ICD-10-CM | POA: Diagnosis not present

## 2022-11-07 DIAGNOSIS — H40013 Open angle with borderline findings, low risk, bilateral: Secondary | ICD-10-CM | POA: Diagnosis not present

## 2022-11-07 DIAGNOSIS — E119 Type 2 diabetes mellitus without complications: Secondary | ICD-10-CM | POA: Diagnosis not present

## 2022-11-25 ENCOUNTER — Other Ambulatory Visit: Payer: Self-pay | Admitting: Adult Health

## 2023-01-03 ENCOUNTER — Other Ambulatory Visit (HOSPITAL_COMMUNITY): Payer: Self-pay

## 2023-01-23 DIAGNOSIS — Z961 Presence of intraocular lens: Secondary | ICD-10-CM | POA: Diagnosis not present

## 2023-01-23 DIAGNOSIS — H532 Diplopia: Secondary | ICD-10-CM | POA: Diagnosis not present

## 2023-01-23 DIAGNOSIS — H2511 Age-related nuclear cataract, right eye: Secondary | ICD-10-CM | POA: Diagnosis not present

## 2023-01-23 DIAGNOSIS — H5032 Intermittent alternating esotropia: Secondary | ICD-10-CM | POA: Diagnosis not present

## 2023-02-27 DIAGNOSIS — E119 Type 2 diabetes mellitus without complications: Secondary | ICD-10-CM | POA: Diagnosis not present

## 2023-02-27 DIAGNOSIS — H40013 Open angle with borderline findings, low risk, bilateral: Secondary | ICD-10-CM | POA: Diagnosis not present

## 2023-02-27 DIAGNOSIS — H2511 Age-related nuclear cataract, right eye: Secondary | ICD-10-CM | POA: Diagnosis not present

## 2023-02-27 DIAGNOSIS — H02834 Dermatochalasis of left upper eyelid: Secondary | ICD-10-CM | POA: Diagnosis not present

## 2023-03-12 DIAGNOSIS — E119 Type 2 diabetes mellitus without complications: Secondary | ICD-10-CM | POA: Diagnosis not present

## 2023-03-12 DIAGNOSIS — I10 Essential (primary) hypertension: Secondary | ICD-10-CM | POA: Diagnosis not present

## 2023-03-12 DIAGNOSIS — I509 Heart failure, unspecified: Secondary | ICD-10-CM | POA: Diagnosis not present

## 2023-03-12 DIAGNOSIS — I4891 Unspecified atrial fibrillation: Secondary | ICD-10-CM | POA: Diagnosis not present

## 2023-03-12 DIAGNOSIS — I4892 Unspecified atrial flutter: Secondary | ICD-10-CM | POA: Diagnosis not present

## 2023-03-12 DIAGNOSIS — R5383 Other fatigue: Secondary | ICD-10-CM | POA: Diagnosis not present

## 2023-03-12 DIAGNOSIS — Z0000 Encounter for general adult medical examination without abnormal findings: Secondary | ICD-10-CM | POA: Diagnosis not present

## 2023-03-14 LAB — LAB REPORT - SCANNED: EGFR: 37

## 2023-03-19 DIAGNOSIS — I10 Essential (primary) hypertension: Secondary | ICD-10-CM | POA: Diagnosis not present

## 2023-03-19 DIAGNOSIS — R001 Bradycardia, unspecified: Secondary | ICD-10-CM | POA: Diagnosis not present

## 2023-03-26 DIAGNOSIS — H8113 Benign paroxysmal vertigo, bilateral: Secondary | ICD-10-CM | POA: Diagnosis not present

## 2023-03-26 DIAGNOSIS — M6281 Muscle weakness (generalized): Secondary | ICD-10-CM | POA: Diagnosis not present

## 2023-03-26 DIAGNOSIS — R269 Unspecified abnormalities of gait and mobility: Secondary | ICD-10-CM | POA: Diagnosis not present

## 2023-04-02 DIAGNOSIS — R269 Unspecified abnormalities of gait and mobility: Secondary | ICD-10-CM | POA: Diagnosis not present

## 2023-04-02 DIAGNOSIS — H8113 Benign paroxysmal vertigo, bilateral: Secondary | ICD-10-CM | POA: Diagnosis not present

## 2023-04-02 DIAGNOSIS — M6281 Muscle weakness (generalized): Secondary | ICD-10-CM | POA: Diagnosis not present

## 2023-04-04 DIAGNOSIS — M6281 Muscle weakness (generalized): Secondary | ICD-10-CM | POA: Diagnosis not present

## 2023-04-04 DIAGNOSIS — R269 Unspecified abnormalities of gait and mobility: Secondary | ICD-10-CM | POA: Diagnosis not present

## 2023-04-04 DIAGNOSIS — H8113 Benign paroxysmal vertigo, bilateral: Secondary | ICD-10-CM | POA: Diagnosis not present

## 2023-04-05 ENCOUNTER — Telehealth: Payer: Self-pay

## 2023-04-05 NOTE — Telephone Encounter (Signed)
Received fax from [facility], Lance Bosch, NP with medication adjustments. Made changes to pt's medication list per NP. Labs will be uploaded to the chart.

## 2023-04-09 DIAGNOSIS — R269 Unspecified abnormalities of gait and mobility: Secondary | ICD-10-CM | POA: Diagnosis not present

## 2023-04-09 DIAGNOSIS — M6281 Muscle weakness (generalized): Secondary | ICD-10-CM | POA: Diagnosis not present

## 2023-04-09 DIAGNOSIS — H8113 Benign paroxysmal vertigo, bilateral: Secondary | ICD-10-CM | POA: Diagnosis not present

## 2023-04-11 DIAGNOSIS — H8113 Benign paroxysmal vertigo, bilateral: Secondary | ICD-10-CM | POA: Diagnosis not present

## 2023-04-11 DIAGNOSIS — R269 Unspecified abnormalities of gait and mobility: Secondary | ICD-10-CM | POA: Diagnosis not present

## 2023-04-11 DIAGNOSIS — M6281 Muscle weakness (generalized): Secondary | ICD-10-CM | POA: Diagnosis not present

## 2023-04-16 DIAGNOSIS — R269 Unspecified abnormalities of gait and mobility: Secondary | ICD-10-CM | POA: Diagnosis not present

## 2023-04-16 DIAGNOSIS — H8113 Benign paroxysmal vertigo, bilateral: Secondary | ICD-10-CM | POA: Diagnosis not present

## 2023-04-16 DIAGNOSIS — M6281 Muscle weakness (generalized): Secondary | ICD-10-CM | POA: Diagnosis not present

## 2023-04-18 DIAGNOSIS — M6281 Muscle weakness (generalized): Secondary | ICD-10-CM | POA: Diagnosis not present

## 2023-04-18 DIAGNOSIS — R269 Unspecified abnormalities of gait and mobility: Secondary | ICD-10-CM | POA: Diagnosis not present

## 2023-04-18 DIAGNOSIS — H8113 Benign paroxysmal vertigo, bilateral: Secondary | ICD-10-CM | POA: Diagnosis not present

## 2023-04-23 DIAGNOSIS — M6281 Muscle weakness (generalized): Secondary | ICD-10-CM | POA: Diagnosis not present

## 2023-04-23 DIAGNOSIS — R269 Unspecified abnormalities of gait and mobility: Secondary | ICD-10-CM | POA: Diagnosis not present

## 2023-04-23 DIAGNOSIS — H8113 Benign paroxysmal vertigo, bilateral: Secondary | ICD-10-CM | POA: Diagnosis not present

## 2023-04-25 DIAGNOSIS — M6281 Muscle weakness (generalized): Secondary | ICD-10-CM | POA: Diagnosis not present

## 2023-04-25 DIAGNOSIS — H8113 Benign paroxysmal vertigo, bilateral: Secondary | ICD-10-CM | POA: Diagnosis not present

## 2023-04-25 DIAGNOSIS — R269 Unspecified abnormalities of gait and mobility: Secondary | ICD-10-CM | POA: Diagnosis not present

## 2023-04-30 DIAGNOSIS — R269 Unspecified abnormalities of gait and mobility: Secondary | ICD-10-CM | POA: Diagnosis not present

## 2023-04-30 DIAGNOSIS — M6281 Muscle weakness (generalized): Secondary | ICD-10-CM | POA: Diagnosis not present

## 2023-04-30 DIAGNOSIS — H8113 Benign paroxysmal vertigo, bilateral: Secondary | ICD-10-CM | POA: Diagnosis not present

## 2023-05-02 DIAGNOSIS — M6281 Muscle weakness (generalized): Secondary | ICD-10-CM | POA: Diagnosis not present

## 2023-05-02 DIAGNOSIS — H8113 Benign paroxysmal vertigo, bilateral: Secondary | ICD-10-CM | POA: Diagnosis not present

## 2023-05-02 DIAGNOSIS — R269 Unspecified abnormalities of gait and mobility: Secondary | ICD-10-CM | POA: Diagnosis not present

## 2023-05-07 DIAGNOSIS — M6281 Muscle weakness (generalized): Secondary | ICD-10-CM | POA: Diagnosis not present

## 2023-05-07 DIAGNOSIS — H8113 Benign paroxysmal vertigo, bilateral: Secondary | ICD-10-CM | POA: Diagnosis not present

## 2023-05-07 DIAGNOSIS — R269 Unspecified abnormalities of gait and mobility: Secondary | ICD-10-CM | POA: Diagnosis not present

## 2023-05-08 DIAGNOSIS — I10 Essential (primary) hypertension: Secondary | ICD-10-CM | POA: Diagnosis not present

## 2023-05-10 ENCOUNTER — Encounter: Payer: Self-pay | Admitting: Emergency Medicine

## 2023-05-13 DIAGNOSIS — I10 Essential (primary) hypertension: Secondary | ICD-10-CM | POA: Diagnosis not present

## 2023-05-13 DIAGNOSIS — R5383 Other fatigue: Secondary | ICD-10-CM | POA: Diagnosis not present

## 2023-05-13 DIAGNOSIS — R609 Edema, unspecified: Secondary | ICD-10-CM | POA: Diagnosis not present

## 2023-05-13 DIAGNOSIS — I509 Heart failure, unspecified: Secondary | ICD-10-CM | POA: Diagnosis not present

## 2023-05-23 DIAGNOSIS — Z1211 Encounter for screening for malignant neoplasm of colon: Secondary | ICD-10-CM | POA: Diagnosis not present

## 2023-05-23 DIAGNOSIS — I5040 Unspecified combined systolic (congestive) and diastolic (congestive) heart failure: Secondary | ICD-10-CM | POA: Diagnosis not present

## 2023-05-23 DIAGNOSIS — R300 Dysuria: Secondary | ICD-10-CM | POA: Diagnosis not present

## 2023-05-23 DIAGNOSIS — D649 Anemia, unspecified: Secondary | ICD-10-CM | POA: Diagnosis not present

## 2023-05-23 DIAGNOSIS — Z0000 Encounter for general adult medical examination without abnormal findings: Secondary | ICD-10-CM | POA: Diagnosis not present

## 2023-05-28 DIAGNOSIS — D649 Anemia, unspecified: Secondary | ICD-10-CM | POA: Diagnosis not present

## 2023-05-28 DIAGNOSIS — D509 Iron deficiency anemia, unspecified: Secondary | ICD-10-CM | POA: Diagnosis not present

## 2023-05-28 DIAGNOSIS — R300 Dysuria: Secondary | ICD-10-CM | POA: Diagnosis not present

## 2023-05-28 DIAGNOSIS — N185 Chronic kidney disease, stage 5: Secondary | ICD-10-CM | POA: Diagnosis not present

## 2023-05-28 DIAGNOSIS — K921 Melena: Secondary | ICD-10-CM | POA: Diagnosis not present

## 2023-05-28 DIAGNOSIS — I509 Heart failure, unspecified: Secondary | ICD-10-CM | POA: Diagnosis not present

## 2023-05-28 DIAGNOSIS — N184 Chronic kidney disease, stage 4 (severe): Secondary | ICD-10-CM | POA: Diagnosis not present

## 2023-06-18 ENCOUNTER — Other Ambulatory Visit: Payer: Self-pay | Admitting: Nephrology

## 2023-06-18 DIAGNOSIS — I129 Hypertensive chronic kidney disease with stage 1 through stage 4 chronic kidney disease, or unspecified chronic kidney disease: Secondary | ICD-10-CM | POA: Diagnosis not present

## 2023-06-18 DIAGNOSIS — N184 Chronic kidney disease, stage 4 (severe): Secondary | ICD-10-CM | POA: Diagnosis not present

## 2023-06-18 DIAGNOSIS — N2581 Secondary hyperparathyroidism of renal origin: Secondary | ICD-10-CM | POA: Diagnosis not present

## 2023-06-18 DIAGNOSIS — N189 Chronic kidney disease, unspecified: Secondary | ICD-10-CM

## 2023-06-18 DIAGNOSIS — R300 Dysuria: Secondary | ICD-10-CM

## 2023-06-18 DIAGNOSIS — D631 Anemia in chronic kidney disease: Secondary | ICD-10-CM | POA: Diagnosis not present

## 2023-06-19 ENCOUNTER — Ambulatory Visit
Admission: RE | Admit: 2023-06-19 | Discharge: 2023-06-19 | Disposition: A | Discharge status: Home / Self Care | Payer: Medicare Other | Source: Ambulatory Visit | Attending: Nephrology | Admitting: Nephrology

## 2023-06-19 DIAGNOSIS — I129 Hypertensive chronic kidney disease with stage 1 through stage 4 chronic kidney disease, or unspecified chronic kidney disease: Secondary | ICD-10-CM

## 2023-06-19 DIAGNOSIS — N2581 Secondary hyperparathyroidism of renal origin: Secondary | ICD-10-CM

## 2023-06-19 DIAGNOSIS — R300 Dysuria: Secondary | ICD-10-CM

## 2023-06-19 DIAGNOSIS — N189 Chronic kidney disease, unspecified: Secondary | ICD-10-CM

## 2023-06-19 LAB — LAB REPORT - SCANNED
Albumin, Urine POC: 196.3
Albumin/Creatinine Ratio, Urine, POC: 241
Creatinine, POC: 81.3 mg/dL
EGFR: 24

## 2023-06-21 ENCOUNTER — Other Ambulatory Visit: Payer: Self-pay

## 2023-06-21 ENCOUNTER — Encounter (HOSPITAL_COMMUNITY): Payer: Self-pay

## 2023-06-21 ENCOUNTER — Emergency Department (HOSPITAL_COMMUNITY): Payer: Medicare Other

## 2023-06-21 ENCOUNTER — Emergency Department (HOSPITAL_COMMUNITY)
Admission: EM | Admit: 2023-06-21 | Discharge: 2023-06-21 | Disposition: A | Discharge status: Home / Self Care | Payer: Medicare Other | Attending: Emergency Medicine | Admitting: Emergency Medicine

## 2023-06-21 DIAGNOSIS — Z79899 Other long term (current) drug therapy: Secondary | ICD-10-CM | POA: Diagnosis not present

## 2023-06-21 DIAGNOSIS — N401 Enlarged prostate with lower urinary tract symptoms: Secondary | ICD-10-CM | POA: Diagnosis not present

## 2023-06-21 DIAGNOSIS — R300 Dysuria: Secondary | ICD-10-CM | POA: Diagnosis not present

## 2023-06-21 DIAGNOSIS — Z7901 Long term (current) use of anticoagulants: Secondary | ICD-10-CM | POA: Diagnosis not present

## 2023-06-21 DIAGNOSIS — K5730 Diverticulosis of large intestine without perforation or abscess without bleeding: Secondary | ICD-10-CM | POA: Diagnosis not present

## 2023-06-21 DIAGNOSIS — E1122 Type 2 diabetes mellitus with diabetic chronic kidney disease: Secondary | ICD-10-CM | POA: Insufficient documentation

## 2023-06-21 DIAGNOSIS — K449 Diaphragmatic hernia without obstruction or gangrene: Secondary | ICD-10-CM | POA: Diagnosis not present

## 2023-06-21 DIAGNOSIS — R531 Weakness: Secondary | ICD-10-CM | POA: Diagnosis not present

## 2023-06-21 DIAGNOSIS — I959 Hypotension, unspecified: Secondary | ICD-10-CM | POA: Diagnosis not present

## 2023-06-21 DIAGNOSIS — Z7984 Long term (current) use of oral hypoglycemic drugs: Secondary | ICD-10-CM | POA: Diagnosis not present

## 2023-06-21 DIAGNOSIS — I129 Hypertensive chronic kidney disease with stage 1 through stage 4 chronic kidney disease, or unspecified chronic kidney disease: Secondary | ICD-10-CM | POA: Diagnosis not present

## 2023-06-21 DIAGNOSIS — C61 Malignant neoplasm of prostate: Secondary | ICD-10-CM | POA: Diagnosis not present

## 2023-06-21 DIAGNOSIS — N189 Chronic kidney disease, unspecified: Secondary | ICD-10-CM | POA: Diagnosis not present

## 2023-06-21 DIAGNOSIS — I10 Essential (primary) hypertension: Secondary | ICD-10-CM | POA: Diagnosis not present

## 2023-06-21 DIAGNOSIS — N184 Chronic kidney disease, stage 4 (severe): Secondary | ICD-10-CM | POA: Diagnosis not present

## 2023-06-21 LAB — COMPREHENSIVE METABOLIC PANEL
ALT: 14 U/L (ref 0–44)
AST: 18 U/L (ref 15–41)
Albumin: 3.5 g/dL (ref 3.5–5.0)
Alkaline Phosphatase: 59 U/L (ref 38–126)
Anion gap: 10 (ref 5–15)
BUN: 41 mg/dL — ABNORMAL HIGH (ref 8–23)
CO2: 26 mmol/L (ref 22–32)
Calcium: 8.4 mg/dL — ABNORMAL LOW (ref 8.9–10.3)
Chloride: 102 mmol/L (ref 98–111)
Creatinine, Ser: 2.42 mg/dL — ABNORMAL HIGH (ref 0.61–1.24)
GFR, Estimated: 24 mL/min — ABNORMAL LOW (ref >60)
Glucose, Bld: 131 mg/dL — ABNORMAL HIGH (ref 70–99)
Potassium: 3.5 mmol/L (ref 3.5–5.1)
Sodium: 138 mmol/L (ref 135–145)
Total Bilirubin: 0.5 mg/dL (ref 0.3–1.2)
Total Protein: 6.8 g/dL (ref 6.5–8.1)

## 2023-06-21 LAB — PSA: Prostatic Specific Antigen: 209.69 ng/mL — ABNORMAL HIGH (ref 0.00–4.00)

## 2023-06-21 LAB — CBC WITH DIFFERENTIAL/PLATELET
Abs Immature Granulocytes: 0.02 10*3/uL (ref 0.00–0.07)
Basophils Absolute: 0 10*3/uL (ref 0.0–0.1)
Basophils Relative: 0 %
Eosinophils Absolute: 0 10*3/uL (ref 0.0–0.5)
Eosinophils Relative: 0 %
HCT: 30.6 % — ABNORMAL LOW (ref 39.0–52.0)
Hemoglobin: 9.7 g/dL — ABNORMAL LOW (ref 13.0–17.0)
Immature Granulocytes: 0 %
Lymphocytes Relative: 17 %
Lymphs Abs: 1 10*3/uL (ref 0.7–4.0)
MCH: 29.1 pg (ref 26.0–34.0)
MCHC: 31.7 g/dL (ref 30.0–36.0)
MCV: 91.9 fL (ref 80.0–100.0)
Monocytes Absolute: 0.4 10*3/uL (ref 0.1–1.0)
Monocytes Relative: 7 %
Neutro Abs: 4.4 10*3/uL (ref 1.7–7.7)
Neutrophils Relative %: 76 %
Platelets: 254 10*3/uL (ref 150–400)
RBC: 3.33 MIL/uL — ABNORMAL LOW (ref 4.22–5.81)
RDW: 13.3 % (ref 11.5–15.5)
WBC: 5.9 10*3/uL (ref 4.0–10.5)
nRBC: 0 % (ref 0.0–0.2)

## 2023-06-21 LAB — URINALYSIS, ROUTINE W REFLEX MICROSCOPIC
Bacteria, UA: NONE SEEN
Bilirubin Urine: NEGATIVE
Glucose, UA: NEGATIVE mg/dL
Hgb urine dipstick: NEGATIVE
Ketones, ur: NEGATIVE mg/dL
Leukocytes,Ua: NEGATIVE
Nitrite: NEGATIVE
Protein, ur: 30 mg/dL — AB
Specific Gravity, Urine: 1.015 (ref 1.005–1.030)
pH: 5 (ref 5.0–8.0)

## 2023-06-21 LAB — LIPASE, BLOOD: Lipase: 36 U/L (ref 11–51)

## 2023-06-21 MED ORDER — PHENAZOPYRIDINE HCL 200 MG PO TABS: 200.0000 mg | ORAL_TABLET | Freq: Three times a day (TID) | ORAL | 0 refills | Status: DC

## 2023-06-21 MED ORDER — PHENAZOPYRIDINE HCL 200 MG PO TABS
200.0000 mg | ORAL_TABLET | Freq: Once | ORAL | Status: AC
  Administered 2023-06-21: 200 mg via ORAL
  Filled 2023-06-21: qty 1

## 2023-06-21 MED ORDER — SODIUM CHLORIDE 0.9 % IV BOLUS
500.0000 mL | Freq: Once | INTRAVENOUS | Status: AC
  Administered 2023-06-21: 500 mL via INTRAVENOUS

## 2023-06-21 NOTE — ED Notes (Signed)
Pt urinated again and states he had less difficulty, voided more than prior void. Still had slight difficulty urinating

## 2023-06-21 NOTE — ED Provider Notes (Signed)
[place] EMERGENCY DEPARTMENT AT [facility] Provider Note   CSN: [id number] Arrival date & time: 06/21/23  1413     History  Chief Complaint  Patient presents with   urinary problem    Carl Strong is a 89 y.o. male.  Pt is a 89 yo male with pmhx significant for htn, gerd, kidney stones, dm2, prostate cancer, hld, arthritis, and afib (on Eliquis).  Pt said he has been feeling like he needs to urinate, but not much comes out.  He was up several times trying to urinate last night.  He does have some burning with urination.  Pt has not had fever.         Home Medications Prior to Admission medications   Medication Sig Start Date End Date Taking? Authorizing Provider  phenazopyridine (PYRIDIUM) 200 MG tablet Take 1 tablet (200 mg total) by mouth 3 (three) times daily. 06/21/23  Yes Jacalyn Lefevre, MD  acetaminophen (TYLENOL) 325 MG tablet Take 2 tablets (650 mg total) by mouth every 6 (six) hours as needed for mild pain (or Fever >/= 101). Patient not taking: Reported on 10/02/2022 01/08/17   Joseph Art, DO  amLODipine (NORVASC) 10 MG tablet Take 10 mg by mouth daily. 03/19/23   [provider]  apixaban (ELIQUIS) 5 MG TABS tablet Take 1 tablet (5 mg total) by mouth 2 (two) times daily. 05/23/22   Runell Gess, MD  apixaban (ELIQUIS) 5 MG TABS tablet Take 1 tablet (5 mg total) by mouth 2 (two) times daily. 07/20/22   Jodelle Gross, NP  glimepiride (AMARYL) 4 MG tablet Take 1 tablet (4 mg total) by mouth daily with breakfast. 06/05/22   Lonia Blood, MD  glucose blood (ONETOUCH ULTRA) test strip USE 1 STRIP TO CHECK GLUCOSE ONCE DAILY Dx: E11.42    [provider]  hydrochlorothiazide (MICROZIDE) 12.5 MG capsule Take 1 capsule (12.5 mg total) by mouth as needed. For swelling and edema 07/20/22 10/18/22  Jodelle Gross, NP  metoprolol succinate (TOPROL-XL) 25 MG 24 hr tablet Take 0.5 tablets (12.5 mg total) by mouth daily. 06/06/22    Lonia Blood, MD  Potassium Chloride ER 20 MEQ TBCR Take 20 mEq by mouth daily. 05/31/22   [provider]  pravastatin (PRAVACHOL) 10 MG tablet Take 10 mg by mouth daily.    [provider]      Allergies    Atorvastatin, Crestor [rosuvastatin], Empagliflozin, and Ezetimibe    Review of Systems   Review of Systems  Genitourinary:  Positive for dysuria.  All other systems reviewed and are negative.   Physical Exam Updated Vital Signs BP (!) 185/73   Pulse 72   Temp 98.3 F (36.8 C) (Oral)   Resp 17   Ht 5\' 10"  (1.778 m)   Wt 78.9 kg   SpO2 100%   BMI 24.97 kg/m  Physical Exam Vitals and nursing note reviewed.  Constitutional:      Appearance: Normal appearance.  HENT:     Head: Normocephalic and atraumatic.     Right Ear: External ear normal.     Left Ear: External ear normal.     Nose: Nose normal.     Mouth/Throat:     Mouth: Mucous membranes are moist.     Pharynx: Oropharynx is clear.  Eyes:     Extraocular Movements: Extraocular movements intact.     Conjunctiva/sclera: Conjunctivae normal.     Pupils: Pupils are equal, round, and  reactive to light.  Cardiovascular:     Rate and Rhythm: Normal rate and regular rhythm.     Pulses: Normal pulses.     Heart sounds: Normal heart sounds.  Pulmonary:     Effort: Pulmonary effort is normal.     Breath sounds: Normal breath sounds.  Abdominal:     General: Abdomen is flat. Bowel sounds are normal.     Palpations: Abdomen is soft.  Musculoskeletal:        General: Normal range of motion.     Cervical back: Normal range of motion and neck supple.  Skin:    General: Skin is warm.     Capillary Refill: Capillary refill takes less than 2 seconds.  Neurological:     General: No focal deficit present.     Mental Status: He is alert and oriented to person, place, and time.  Psychiatric:        Mood and Affect: Mood normal.        Behavior: Behavior normal.     ED Results / Procedures /  Treatments   Labs (all labs ordered are listed, but only abnormal results are displayed) Labs Reviewed  CBC WITH DIFFERENTIAL/PLATELET - Abnormal; Notable for the following components:      Result Value   RBC 3.33 (*)    Hemoglobin 9.7 (*)    HCT 30.6 (*)    All other components within normal limits  COMPREHENSIVE METABOLIC PANEL - Abnormal; Notable for the following components:   Glucose, Bld 131 (*)    BUN 41 (*)    Creatinine, Ser 2.42 (*)    Calcium 8.4 (*)    GFR, Estimated 24 (*)    All other components within normal limits  URINALYSIS, ROUTINE W REFLEX MICROSCOPIC - Abnormal; Notable for the following components:   Protein, ur 30 (*)    All other components within normal limits  LIPASE, BLOOD  PSA    EKG None  Radiology CT Renal Stone Study  Result Date: 06/21/2023 CLINICAL DATA:  Flank pain.  History of prostate cancer. EXAM: CT ABDOMEN AND PELVIS WITHOUT CONTRAST TECHNIQUE: Multidetector CT imaging of the abdomen and pelvis was performed following the standard protocol without IV contrast. RADIATION DOSE REDUCTION: This exam was performed according to the departmental dose-optimization program which includes automated exposure control, adjustment of the mA and/or kV according to patient size and/or use of iterative reconstruction technique. COMPARISON:  MRI abdomen 12/14/2020. Renal ultrasound 06/19/2023. CT 01/07/2017 FINDINGS: Lower chest: Reticular changes along lung bases with some subtle ground-glass and some pleural thickening. Small hiatal hernia. Coronary artery calcifications are seen. Hepatobiliary: On this non IV contrast exam, liver is grossly preserved. Previous cholecystectomy. Pancreas: Moderate atrophy of the pancreas. Once again there are cystic areas along the pancreas. Example towards the tail measures 15 mm. On the previous MRI from December 2021 this would have measured 17 mm when measured in the same fashion as today, not significantly changed when  adjusting for technique. No additional imaging follow-up. Spleen: Normal in size without focal abnormality. Adrenals/Urinary Tract: Stable nodular thickening of the left adrenal gland greater than right. Adenomatous. No specific follow-up. Moderate perinephric stranding. No renal or ureteral stones. There is moderate left-sided renal collecting system dilatation and slight right. The left ureter is dilated down to the UVJ where there is an invasive mass along the left side of the bladder in contiguity with the an abnormal left-sided prostatic lesion. Please correlate for history of neoplasm. The mass approaches  in proximity to the right UVJ as well. The dilatation is new from the prior cross-sectional imaging of 2021 but was seen on the recent ultrasound. Stomach/Bowel: No oral contrast. Moderate debris in the stomach which is nondilated. The small bowel is nondilated. Large bowel is of normal course and caliber with scattered stool. Left-sided colonic diverticula. Normal retrocecal appendix. Vascular/Lymphatic: Diffuse vascular calcifications identified with areas of calcifications extending along the branch vessels diffusely. Areas of stenosis are suggested particularly along the aortic bifurcation and common iliac vessels. Please correlate with any particular symptoms and further workup as clinically appropriate. Several abnormal lymph nodes are now identified in the retroperitoneum. Example left para-aortic on series 2, image 34 measures 2.8 by 2.1 cm. Retrocaval node on series 2, image 30 measures 2.7 by 1.5 cm. Abnormal nodes extend from the level of the renal vessels and along the pelvic regions. Left-sided pelvic sidewall, iliac node on series 2, image 62 measures 2.1 by 1.6 cm. Reproductive: Ill-defined mass along left side of the prostate with invasion of the left side of the bladder involving the UVJ and likely parametrial structures. There is also involvement of the seminal vesicle on the left. There is  also loss of fat plane between the prostate in the rectum anteriorly left of midline as seen for example on axial series 2, image 80 and sagittal series 8, image 94. Involvement of the rectum is possible. In IV contrast and rectal contrast study could be considered for further delineation as clinically appropriate. Other: Anasarca. Mesenteric stranding. No free air or ascites. Fat containing bilateral inguinal hernias. Musculoskeletal: Osteopenia. Scattered degenerative changes of the spine and pelvis. There is moderate compression of L3. There is a displacement of the superior endplate of L3 towards the central canal with associated moderate stenosis. This has not seen in 2018 nor clearly in 2021. appearance favors a more chronic process however. IMPRESSION: Invasive left-sided prostate mass extending along the base of the bladder and possibly the anterior rectal wall. The bladder involvement does extend towards the UVJ region on the left. Possibly on the right. Associated collecting system dilatation of the left kidney greater than right. Please correlate for any history of neoplasm including prostate cancer. No separate obstructing renal stones. Several abnormal lymph nodes throughout the retroperitoneum and left pelvic sidewall worrisome for spread of neoplasm. Moderate compression of L3 with some canal stenosis. This has not seen on the study of 2021 but is sclerotic margin and may be more chronic. Please correlate with clinical history. Hiatal hernia. Significant vascular calcifications in the abdomen and pelvis with areas of stenosis particularly along the aortic bifurcation. Please correlate with any symptoms. Further evaluation as clinically appropriate. Stable pancreatic cystic lesions.  No specific imaging follow-up Electronically Signed   By: Karen Kays M.D.   On: 06/21/2023 17:26    Procedures Procedures    Medications Ordered in ED Medications  sodium chloride 0.9 % bolus 500 mL (500 mLs  Intravenous New Bag/Given 06/21/23 1545)  phenazopyridine (PYRIDIUM) tablet 200 mg (200 mg Oral Given 06/21/23 1545)    ED Course/ Medical Decision Making/ A&P                             Medical Decision Making Amount and/or Complexity of Data Reviewed Labs: ordered. Radiology: ordered.  Risk Prescription drug management.   This patient presents to the ED for concern of dysuria, this involves an extensive number of treatment options, and is  a complaint that carries with it a high risk of complications and morbidity.  The differential diagnosis includes uti, kidney stone, bladder outlet obstr   Co morbidities that complicate the patient evaluation  htn, gerd, kidney stones, dm2, prostate cancer, hld, arthritis, and afib (on Eliquis   Additional history obtained:  Additional history obtained from epic chart review External records from outside source obtained and reviewed including EMS report   Lab Tests:  I Ordered, and personally interpreted labs.  The pertinent results include:  cbc with hgb 9.7 (hgb 12.7 last year, but was also 10.4 in June of last year); cmp with bun 41 and cr 2.42 (cr 1.29 a year ago, but he had an [place] earlier this month for CKD stage 4)   Imaging Studies ordered:  I ordered imaging studies including ct renal  I independently visualized and interpreted imaging which showed   Invasive left-sided prostate mass extending along the base of the  bladder and possibly the anterior rectal wall. The bladder  involvement does extend towards the UVJ region on the left. Possibly  on the right. Associated collecting system dilatation of the left  kidney greater than right. Please correlate for any history of  neoplasm including prostate cancer.    No separate obstructing renal stones.    Several abnormal lymph nodes throughout the retroperitoneum and left  pelvic sidewall worrisome for spread of neoplasm.    Moderate compression of L3 with some canal  stenosis. This has not  seen on the study of 2021 but is sclerotic margin and may be more  chronic. Please correlate with clinical history.    Hiatal hernia.    Significant vascular calcifications in the abdomen and pelvis with  areas of stenosis particularly along the aortic bifurcation. Please  correlate with any symptoms. Further evaluation as clinically  appropriate.    Stable pancreatic cystic lesions.  No specific imaging follow-up   I agree with the radiologist interpretation   Cardiac Monitoring:  The patient was maintained on a cardiac monitor.  I personally viewed and interpreted the cardiac monitored which showed an underlying rhythm of: nsr   Medicines ordered and prescription drug management:  I ordered medication including ivfs/pyridium  for sx  Reevaluation of the patient after these medicines showed that the patient improved I have reviewed the patients home medicines and have made adjustments as needed   Test Considered:  ct   Critical Interventions:  ivfs   Consultations Obtained:  I requested consultation with the urology (Dr. Benancio Deeds),  and discussed lab and imaging findings as well as pertinent plan - he recommends outpatient follow up as it looks like he's not retaining and cr is stable.  He requests a PSA, so that will be done prior to d/c.   Problem List / ED Course:  Prostate cancer recurrence:  Cr elevated from last year, but he had an [place] on 6/19 for ckd, stage 4.  So, it looks like this is chronic.  Pt is not retaining urine.  He feels better after pyridium.  He is stable for d/c.  He is to return if worse.  F/u with urology.   Reevaluation:  After the interventions noted above, I reevaluated the patient and found that they have :improved   Social Determinants of Health:  Lives at home   Dispostion:  After consideration of the diagnostic results and the patients response to treatment, I feel that the patent would benefit from  discharge with outpatient f/u.  Final Clinical Impression(s) / ED Diagnoses Final diagnoses:  Prostate cancer (HCC)  CKD (chronic kidney disease) stage 4, GFR 15-29 ml/min (HCC)    Rx / DC Orders ED Discharge Orders          Ordered    phenazopyridine (PYRIDIUM) 200 MG tablet  3 times daily        06/21/23 1817              Jacalyn Lefevre, MD 06/21/23 1825

## 2023-06-21 NOTE — Discharge Instructions (Addendum)
Unfortunately, it looks like your prostate cancer has come back.  It is putting some pressure on your kidneys and your bladder.  You need to call the Urologist office on Monday to make an appointment.

## 2023-06-21 NOTE — ED Triage Notes (Signed)
BIB EMS from home for urinary issue. Pt has been having very small urinary output for the last week, still able to urinate.  Hx of prostate issues.

## 2023-07-31 DIAGNOSIS — C778 Secondary and unspecified malignant neoplasm of lymph nodes of multiple regions: Secondary | ICD-10-CM | POA: Diagnosis not present

## 2023-07-31 DIAGNOSIS — C61 Malignant neoplasm of prostate: Secondary | ICD-10-CM | POA: Diagnosis not present

## 2023-07-31 DIAGNOSIS — N35819 Other urethral stricture, male, unspecified site: Secondary | ICD-10-CM | POA: Diagnosis not present

## 2023-07-31 DIAGNOSIS — R3912 Poor urinary stream: Secondary | ICD-10-CM | POA: Diagnosis not present

## 2023-08-05 DIAGNOSIS — D6869 Other thrombophilia: Secondary | ICD-10-CM | POA: Diagnosis not present

## 2023-08-05 DIAGNOSIS — N183 Chronic kidney disease, stage 3 unspecified: Secondary | ICD-10-CM | POA: Diagnosis not present

## 2023-08-05 DIAGNOSIS — E1142 Type 2 diabetes mellitus with diabetic polyneuropathy: Secondary | ICD-10-CM | POA: Diagnosis not present

## 2023-08-05 DIAGNOSIS — I480 Paroxysmal atrial fibrillation: Secondary | ICD-10-CM | POA: Diagnosis not present

## 2023-08-06 ENCOUNTER — Other Ambulatory Visit: Payer: Self-pay | Admitting: Urology

## 2023-08-06 ENCOUNTER — Telehealth: Payer: Self-pay | Admitting: Cardiovascular Disease

## 2023-08-06 ENCOUNTER — Other Ambulatory Visit: Payer: Self-pay

## 2023-08-06 DIAGNOSIS — I480 Paroxysmal atrial fibrillation: Secondary | ICD-10-CM

## 2023-08-06 DIAGNOSIS — Z01810 Encounter for preprocedural cardiovascular examination: Secondary | ICD-10-CM

## 2023-08-06 NOTE — Telephone Encounter (Signed)
   [facility] Pre-operative Risk Assessment    Request for surgical clearance:  What type of surgery is being performed?  Urethral Dilation & possible Transurethral Resection of Bladder Tumor   When is this surgery scheduled?  08/26/23   What type of clearance is required (medical clearance vs. Pharmacy clearance to hold med vs. Both)?  Both   Are there any medications that need to be held prior to surgery and how long? Eliquis, 2 days prior   Practice name and name of physician performing surgery?  [facility]  Dr. Alvester Morin   What is your office phone number? [phone] (ext#: [phone])    7.   What is your office fax number? [phone]  8.   Anesthesia type (None, local, MAC, general)?  General    Carl Strong 08/06/2023, 11:23 AM

## 2023-08-06 NOTE — Telephone Encounter (Signed)
Pt is coming in for BMP on 08/19 and tele appt scheduled for 08/22. BMP has been ordered.

## 2023-08-06 NOTE — Telephone Encounter (Signed)
Patient with diagnosis of afib on Eliquis for anticoagulation.    Procedure: Urethral Dilation & possible Transurethral Resection of Bladder Tumor  Date of procedure: 08/26/23   CHA2DS2-VASc Score = 4   This indicates a 4.8% annual risk of stroke. The patient's score is based upon: CHF History: 0 HTN History: 1 Diabetes History: 1 Stroke History: 0 Vascular Disease History: 0 Age Score: 2 Gender Score: 0      CrCl 21.14 ml/min Platelet count 254  Per office protocol, patient can hold Eliquis for 3 days prior to procedure.    Can we have patient come in for a repeat BMP. Based on his last labs, he is on the wrong dose of Eliquis.  **This guidance is not considered finalized until pre-operative APP has relayed final recommendations.**

## 2023-08-06 NOTE — Telephone Encounter (Signed)
   Name: Carl Strong  DOB: July 07, 1929  MRN: [id number]  Primary Cardiologist: [name], MD   Preoperative team, please contact this patient and set up a phone call appointment for further preoperative risk assessment. Please obtain consent and complete medication review. Thank you for your help.  I confirm that guidance regarding antiplatelet and oral anticoagulation therapy has been completed and, if necessary, noted below.  Per pharm D, patient may hold Eliquis for 3 days prior to procedure.   *per pharm D request, please have patient come in for BMP.    [name], NP 08/06/2023, 1:52 PM [facility]

## 2023-08-06 NOTE — Telephone Encounter (Signed)
Please advise holding Eliquis prior to urethral dilation and possible TURBT on 08/26/2023.  Thank you!  DW

## 2023-08-09 ENCOUNTER — Other Ambulatory Visit (HOSPITAL_COMMUNITY): Payer: Self-pay | Admitting: Urology

## 2023-08-09 DIAGNOSIS — C61 Malignant neoplasm of prostate: Secondary | ICD-10-CM

## 2023-08-09 NOTE — Progress Notes (Signed)
Sent message, via epic in basket, requesting orders in epic from surgeon.  

## 2023-08-11 NOTE — Progress Notes (Signed)
COVID Vaccine received:  []  No [x]  Yes Date of any COVID positive Test in last 90 days:  None  PCP - Windle Guard, MD Cardiologist - [name], MD   Carl Levering, NP   Cardiac clearance appt- phone call scheduled for 08-22-23  Chest x-ray - 06-03-2022  1v  EPic EKG -  03-19-2023  EPic Stress Test -  ECHO - 06-15-2022  Epic Cardiac Cath -  Zio Monitor- 06-14-2022  Epic  PCR screen: []  Ordered & Completed           []   No Order but Needs PROFEND           [x]   N/A for this surgery  Surgery Plan:  [x]  Ambulatory                            []  Outpatient in bed                            []  Admit  Anesthesia:    [x]  General  []  Spinal                           []   Choice []   MAC  Bowel Prep - [x]  No  []   Yes ______  Pacemaker / ICD device [x]  No []  Yes   Spinal Cord Stimulator:[x]  No []  Yes       History of Sleep Apnea? [x]  No []  Yes   CPAP used?- [x]  No []  Yes    Does the patient monitor blood sugar?          []  No [x]  Yes  []  N/A  Patient has: []  NO Hx DM   []  Pre-DM                 []  DM1  [x]   DM2 Does patient have a [employer] or Dexacom? [x]  No []  Yes   Fasting Blood Sugar Ranges- 100-120 Checks Blood Sugar __1 times a day  Diabetic medications/ instructions: Glimerpiride (Amaryl) 4mg  q am,  Take as usual Day before surgery. Do Not take DOS.   Patient aware  Blood Thinner / Instructions:  Eliquis     Hold x 72 hours , last dose: 08-22-23   Patient is aware Aspirin Instructions:  none  ERAS Protocol Ordered: [x]  No  []  Yes Patient is to be NPO after: midnight prior  Comments: patient has PET scan scheduled for 08-21-23.   Cardiac Clearance phone call will be on 08-22-23.   Activity level: Patient is able to climb a flight of stairs without difficulty; [x]  No CP  but would have SOB.   Patient can perform ADLs without assistance.   Anesthesia review: HTN, A.fib, CKD4, GERD, DM2,  Patient denies shortness of breath, fever, cough and chest pain at PAT  appointment.  Patient verbalized understanding and agreement to the Pre-Surgical Instructions that were given to them at this PAT appointment. Patient was also educated of the need to review these PAT instructions again prior to his surgery.I reviewed the appropriate phone numbers to call if they have any and questions or concerns.

## 2023-08-11 NOTE — Patient Instructions (Signed)
SURGICAL WAITING ROOM VISITATION Patients having surgery or a procedure may have no more than 2 support people in the waiting area - these visitors may rotate in the visitor waiting room.   Due to an increase in RSV and influenza rates and associated hospitalizations, children ages 54 and under may not visit patients in [facility] hospitals. If the patient needs to stay at the hospital during part of their recovery, the visitor guidelines for inpatient rooms apply.  PRE-OP VISITATION  Pre-op nurse will coordinate an appropriate time for 1 support person to accompany the patient in pre-op.  This support person may not rotate.  This visitor will be contacted when the time is appropriate for the visitor to come back in the pre-op area.  Please refer to the [facility] website for the visitor guidelines for Inpatients (after your surgery is over and you are in a regular room).  You are not required to quarantine at this time prior to your surgery. However, you must do this: Hand Hygiene often Do NOT share personal items Notify your provider if you are in close contact with someone who has COVID or you develop fever 100.4 or greater, new onset of sneezing, cough, sore throat, shortness of breath or body aches.  If you test positive for Covid or have been in contact with anyone that has tested positive in the last 10 days please notify you surgeon.    Your procedure is scheduled on:  Monday  August 26, 2023  Report to [facility] Main Entrance: [place] entrance where the [employer] is available.   Report to admitting at: 09:15    AM  Call this number if you have any questions or problems the morning of surgery [phone]  DO NOT EAT OR DRINK ANYTHING AFTER MIDNIGHT THE NIGHT PRIOR TO YOUR SURGERY / PROCEDURE.   FOLLOW BOWEL PREP AND ANY ADDITIONAL PRE OP INSTRUCTIONS YOU RECEIVED FROM YOUR SURGEON'S OFFICE!!!   Oral Hygiene is also important to reduce your risk of infection.         Remember - BRUSH YOUR TEETH THE MORNING OF SURGERY WITH YOUR REGULAR TOOTHPASTE  Do NOT smoke after Midnight the night before surgery.  STOP TAKING all Vitamins, Herbs and supplements 1 week before your surgery.   Take ONLY these medicines the morning of surgery with A SIP OF WATER: metoprolol, hydralazine (Apresoline)                     You may not have any metal on your body including  jewelry, and body piercing  Do not wear  lotions, powders,  cologne, or deodorant  Men may shave face and neck.  Contacts, Hearing Aids, dentures or bridgework may not be worn into surgery. DENTURES WILL BE REMOVED PRIOR TO SURGERY PLEASE DO NOT APPLY "Poly grip" OR ADHESIVES!!!   Patients discharged on the day of surgery will not be allowed to drive home.  Someone NEEDS to stay with you for the first 24 hours after anesthesia.  Do not bring your home medications to the hospital. The Pharmacy will dispense medications listed on your medication list to you during your admission in the Hospital.  Special Instructions: Bring a copy of your healthcare power of attorney and living will documents the day of surgery, if you wish to have them scanned into your [place] Records- EPIC  Please read over the following fact sheets you were given: IF YOU HAVE QUESTIONS ABOUT YOUR PRE-OP INSTRUCTIONS,  PLEASE CALL [phone].   [place] - Preparing for Surgery Before surgery, you can play an important role.  Because skin is not sterile, your skin needs to be as free of germs as possible.  You can reduce the number of germs on your skin by washing with CHG (chlorahexidine gluconate) soap before surgery.  CHG is an antiseptic cleaner which kills germs and bonds with the skin to continue killing germs even after washing. Please DO NOT use if you have an allergy to CHG or antibacterial soaps.  If your skin becomes reddened/irritated stop using the CHG and inform your nurse when you arrive at Short  Stay. Do not shave (including legs and underarms) for at least 48 hours prior to the first CHG shower.  You may shave your face/neck.  Please follow these instructions carefully:  1.  Shower with CHG Soap the night before surgery and the  morning of surgery.  2.  If you choose to wash your hair, wash your hair first as usual with your normal  shampoo.  3.  After you shampoo, rinse your hair and body thoroughly to remove the shampoo.                             4.  Use CHG as you would any other liquid soap.  You can apply chg directly to the skin and wash.  Gently with a scrungie or clean washcloth.  5.  Apply the CHG Soap to your body ONLY FROM THE NECK DOWN.   Do not use on face/ open                           Wound or open sores. Avoid contact with eyes, ears mouth and genitals (private parts).                       Wash face,  Genitals (private parts) with your normal soap.             6.  Wash thoroughly, paying special attention to the area where your  surgery  will be performed.  7.  Thoroughly rinse your body with warm water from the neck down.  8.  DO NOT shower/wash with your normal soap after using and rinsing off the CHG Soap.            9.  Pat yourself dry with a clean towel.            10.  Wear clean pajamas.            11.  Place clean sheets on your bed the night of your first shower and do not  sleep with pets.  ON THE DAY OF SURGERY : Do not apply any lotions/deodorants the morning of surgery.  Please wear clean clothes to the hospital/surgery center.    FAILURE TO FOLLOW THESE INSTRUCTIONS MAY RESULT IN THE CANCELLATION OF YOUR SURGERY  PATIENT SIGNATURE_________________________________  NURSE SIGNATURE__________________________________  ________________________________________________________________________

## 2023-08-13 ENCOUNTER — Other Ambulatory Visit: Payer: Self-pay

## 2023-08-13 ENCOUNTER — Encounter (HOSPITAL_COMMUNITY)
Admission: RE | Admit: 2023-08-13 | Discharge: 2023-08-13 | Disposition: A | Discharge status: Home / Self Care | Payer: Medicare Other | Source: Ambulatory Visit | Attending: Urology | Admitting: Urology

## 2023-08-13 ENCOUNTER — Encounter (HOSPITAL_COMMUNITY): Payer: Self-pay

## 2023-08-13 VITALS — BP 160/65 | HR 58 | Temp 98.0°F | Resp 16 | Ht 71.0 in | Wt 173.0 lb

## 2023-08-13 DIAGNOSIS — E119 Type 2 diabetes mellitus without complications: Secondary | ICD-10-CM | POA: Diagnosis not present

## 2023-08-13 DIAGNOSIS — Z01818 Encounter for other preprocedural examination: Secondary | ICD-10-CM | POA: Diagnosis not present

## 2023-08-13 DIAGNOSIS — Z01812 Encounter for preprocedural laboratory examination: Secondary | ICD-10-CM | POA: Insufficient documentation

## 2023-08-13 HISTORY — DX: Chronic kidney disease, unspecified: N18.9

## 2023-08-13 HISTORY — DX: Cardiac arrhythmia, unspecified: I49.9

## 2023-08-13 LAB — CBC
HCT: 31.7 % — ABNORMAL LOW (ref 39.0–52.0)
Hemoglobin: 10.1 g/dL — ABNORMAL LOW (ref 13.0–17.0)
MCH: 28.9 pg (ref 26.0–34.0)
MCHC: 31.9 g/dL (ref 30.0–36.0)
MCV: 90.8 fL (ref 80.0–100.0)
Platelets: 254 10*3/uL (ref 150–400)
RBC: 3.49 MIL/uL — ABNORMAL LOW (ref 4.22–5.81)
RDW: 14 % (ref 11.5–15.5)
WBC: 6.5 10*3/uL (ref 4.0–10.5)
nRBC: 0 % (ref 0.0–0.2)

## 2023-08-13 LAB — BASIC METABOLIC PANEL
Anion gap: 7 (ref 5–15)
BUN: 35 mg/dL — ABNORMAL HIGH (ref 8–23)
CO2: 22 mmol/L (ref 22–32)
Calcium: 8.1 mg/dL — ABNORMAL LOW (ref 8.9–10.3)
Chloride: 109 mmol/L (ref 98–111)
Creatinine, Ser: 2.48 mg/dL — ABNORMAL HIGH (ref 0.61–1.24)
GFR, Estimated: 24 mL/min — ABNORMAL LOW (ref >60)
Glucose, Bld: 115 mg/dL — ABNORMAL HIGH (ref 70–99)
Potassium: 3.3 mmol/L — ABNORMAL LOW (ref 3.5–5.1)
Sodium: 138 mmol/L (ref 135–145)

## 2023-08-13 LAB — GLUCOSE, CAPILLARY: Glucose-Capillary: 119 mg/dL — ABNORMAL HIGH (ref 70–99)

## 2023-08-15 DIAGNOSIS — C61 Malignant neoplasm of prostate: Secondary | ICD-10-CM | POA: Diagnosis not present

## 2023-08-15 DIAGNOSIS — Z5111 Encounter for antineoplastic chemotherapy: Secondary | ICD-10-CM | POA: Diagnosis not present

## 2023-08-19 ENCOUNTER — Other Ambulatory Visit: Payer: Self-pay

## 2023-08-19 DIAGNOSIS — I480 Paroxysmal atrial fibrillation: Secondary | ICD-10-CM

## 2023-08-19 DIAGNOSIS — Z01810 Encounter for preprocedural cardiovascular examination: Secondary | ICD-10-CM | POA: Diagnosis not present

## 2023-08-20 LAB — BASIC METABOLIC PANEL
BUN/Creatinine Ratio: 12 (ref 10–24)
BUN: 30 mg/dL (ref 10–36)
CO2: 25 mmol/L (ref 20–29)
Calcium: 8.9 mg/dL (ref 8.6–10.2)
Chloride: 102 mmol/L (ref 96–106)
Creatinine, Ser: 2.43 mg/dL — ABNORMAL HIGH (ref 0.76–1.27)
Glucose: 194 mg/dL — ABNORMAL HIGH (ref 70–99)
Potassium: 4.2 mmol/L (ref 3.5–5.2)
Sodium: 140 mmol/L (ref 134–144)
eGFR: 24 mL/min/{1.73_m2} — ABNORMAL LOW (ref >59)

## 2023-08-20 NOTE — Telephone Encounter (Signed)
89 M 79.8 kg, SCr 2.43.  Eliquis 2.5 mg dose is appropriate.  Per chart he is getting 5 mg tabs and taking 1/2 bid.

## 2023-08-21 ENCOUNTER — Encounter (HOSPITAL_COMMUNITY)
Admission: RE | Admit: 2023-08-21 | Discharge: 2023-08-21 | Disposition: A | Discharge status: Home / Self Care | Payer: Medicare Other | Source: Ambulatory Visit | Attending: Urology | Admitting: Urology

## 2023-08-21 DIAGNOSIS — R9721 Rising PSA following treatment for malignant neoplasm of prostate: Secondary | ICD-10-CM | POA: Diagnosis not present

## 2023-08-21 DIAGNOSIS — C61 Malignant neoplasm of prostate: Secondary | ICD-10-CM | POA: Insufficient documentation

## 2023-08-21 DIAGNOSIS — C7951 Secondary malignant neoplasm of bone: Secondary | ICD-10-CM | POA: Diagnosis not present

## 2023-08-21 DIAGNOSIS — C772 Secondary and unspecified malignant neoplasm of intra-abdominal lymph nodes: Secondary | ICD-10-CM | POA: Diagnosis not present

## 2023-08-21 MED ORDER — PIFLIFOLASTAT F 18 (PYLARIFY) INJECTION
9.0000 | Freq: Once | INTRAVENOUS | Status: AC
  Administered 2023-08-21: 9 via INTRAVENOUS

## 2023-08-22 ENCOUNTER — Ambulatory Visit: Payer: Medicare Other | Attending: Cardiovascular Disease

## 2023-08-22 DIAGNOSIS — Z01810 Encounter for preprocedural cardiovascular examination: Secondary | ICD-10-CM | POA: Diagnosis not present

## 2023-08-22 NOTE — Progress Notes (Signed)
Virtual Visit via Telephone Note   Because of [name] co-morbid illnesses, he is at least at moderate risk for complications without adequate follow up.  This format is felt to be most appropriate for this patient at this time.  The patient did not have access to video technology/had technical difficulties with video requiring transitioning to audio format only (telephone).  All issues noted in this document were discussed and addressed.  No physical exam could be performed with this format.  Please refer to the patient's chart for his consent to telehealth for [facility].  Evaluation Performed:  Preoperative cardiovascular risk assessment _____________   Date:  08/22/2023   Patient ID:  Carl Strong, DOB 14-Oct-1929, MRN [id number] Patient Location:  Home Provider location:   Office  Primary Care Provider:  Kaleen Mask, MD Primary Cardiologist:  [name], MD  Chief Complaint / Patient Profile   89 y.o. y/o male with a h/o atrial fibrillation, DM type II, HTN, HLD, arthritis, GERD, prostate CA who is pending TURP and presents today for telephonic preoperative cardiovascular risk assessment.  History of Present Illness    Carl Strong is a 89 y.o. male who presents via audio/video conferencing for a telehealth visit today.  Pt was last seen in cardiology clinic on 10/02/2022 by Dr. Allyson Sabal.  At that time Carl Strong was doing well with no complaints of chest pain or dizziness.  Blood pressures were elevated during visit but reported controlled at home. The patient is now pending procedure as outlined above. Since his last visit, he has been doing well with no new cardiac complaints.  He reports that his blood pressures have been running on the higher side in the 140s over 160s systolically.  He reports today his blood pressure was 147/56.  He was advised to continue to monitor his blood pressures and to contact office if he notices blood  pressures are remaining in the 160s systolically over the next 2 to 3 weeks.  He denies chest pain, shortness of breath, lower extremity edema, fatigue, palpitations, melena, hematuria, hemoptysis, diaphoresis, weakness, presyncope, syncope, orthopnea, and PND.   Past Medical History    Past Medical History:  Diagnosis Date   Arthritis    "left hand" (01/08/2017)   Chronic kidney disease    CKD4   Dysrhythmia    A.fib   GERD (gastroesophageal reflux disease)    High cholesterol    History of kidney stones    Hypertension    Osteopenia    PAF (paroxysmal atrial fibrillation) (HCC)    Prostate cancer (HCC) 2005   Type II diabetes mellitus (HCC)    Past Surgical History:  Procedure Laterality Date   CARPAL TUNNEL RELEASE Right 06/28/2016   Procedure: RIGHT CARPAL TUNNEL RELEASE;  Surgeon: Cindee Salt, MD;  Location: [facility];  Service: Orthopedics;  Laterality: Right;  ANESTHESIA:  IV REGIONAL FAB   CYSTOSCOPY W/ STONE MANIPULATION     LAPAROSCOPIC CHOLECYSTECTOMY     PROSTATECTOMY  04/2004   [name] 05/16/2011   TONSILLECTOMY     URETHRAL STRICTURE DILATATION  07/2005   [name] 05/16/2011    Allergies  Allergies  Allergen Reactions   Crestor [Rosuvastatin]     Other reaction(s): muscle pain   Ezetimibe Other (See Comments)    unknown   Jardiance [Empagliflozin] Nausea And Vomiting and Other (See Comments)    weakness   Lipitor [Atorvastatin] Other (See Comments)    muscle pain  Home Medications    Prior to Admission medications   Medication Sig Start Date End Date Taking? Authorizing Provider  apixaban (ELIQUIS) 5 MG TABS tablet Take 1 tablet (5 mg total) by mouth 2 (two) times daily. Patient taking differently: Take 2.5 mg by mouth 2 (two) times daily. Morning & afternoon 05/23/22   Runell Gess, MD  Ferrous Sulfate Dried (SLOW RELEASE IRON) 45 MG TBCR Take 45 mg by mouth in the morning.    [provider]  glimepiride (AMARYL) 4 MG  tablet Take 1 tablet (4 mg total) by mouth daily with breakfast. 06/05/22   Lonia Blood, MD  glucose blood (ONETOUCH ULTRA) test strip USE 1 STRIP TO CHECK GLUCOSE ONCE DAILY Dx: E11.42    [provider]  hydrALAZINE (APRESOLINE) 10 MG tablet Take 20 mg by mouth in the morning and at bedtime. 06/17/23   [provider]  hydrochlorothiazide (HYDRODIURIL) 25 MG tablet Take 25 mg by mouth every morning. 06/11/23   [provider]  metoprolol succinate (TOPROL-XL) 25 MG 24 hr tablet Take 0.5 tablets (12.5 mg total) by mouth daily. 06/06/22   Lonia Blood, MD  potassium chloride SA (KLOR-CON M) 20 MEQ tablet Take 20 mEq by mouth in the morning. 07/11/23   [provider]    Physical Exam    Vital Signs:  MAVERYK MYUNG does not have vital signs available for review today. 147/56  Given telephonic nature of communication, physical exam is limited. AAOx3. NAD. Normal affect.  Speech and respirations are unlabored.  Accessory Clinical Findings    None  Assessment & Plan    1.  Preoperative Cardiovascular Risk Assessment: -Patient's RCRI score is 6.6%  The patient affirms he has been doing well without any new cardiac symptoms. They are able to achieve 5 METS without cardiac limitations. Therefore, based on ACC/AHA guidelines, the patient would be at acceptable risk for the planned procedure without further cardiovascular testing. The patient was advised that if he develops new symptoms prior to surgery to contact our office to arrange for a follow-up visit, and he verbalized understanding.   The patient was advised that if he develops new symptoms prior to surgery to contact our office to arrange for a follow-up visit, and he verbalized understanding.  Patient can hold Eliquis 3 days prior to procedure and should restart postprocedure when surgically safe and guided by performing provider.  A copy of this note will be routed to requesting  surgeon.  Time:   Today, I have spent 6 minutes with the patient with telehealth technology discussing medical history, symptoms, and management plan.     Napoleon Form, Leodis Rains, NP  08/22/2023, 7:52 AM

## 2023-08-23 NOTE — Anesthesia Preprocedure Evaluation (Addendum)
Anesthesia Evaluation  Patient identified by MRN, date of birth, ID band Patient awake    Reviewed: Allergy & Precautions, NPO status , Patient's Chart, lab work & pertinent test results  Airway Mallampati: II  TM Distance: >3 FB Neck ROM: Full    Dental  (+) Edentulous Upper, Edentulous Lower   Pulmonary former smoker   Pulmonary exam normal        Cardiovascular hypertension, Pt. on medications and Pt. on home beta blockers + dysrhythmias Atrial Fibrillation  Rhythm:Regular Rate:Normal  Echo 06/15/2022 1. Left ventricular ejection fraction, by estimation, is 60 to 65%. The  left ventricle has normal function. The left ventricle has no regional  wall motion abnormalities. There is mild left ventricular hypertrophy.  Left ventricular diastolic parameters  are consistent with Grade II diastolic dysfunction (pseudonormalization).   2. Right ventricular systolic function is normal. The right ventricular  size is normal.   3. Left atrial size was severely dilated.   4. Right atrial size was mildly dilated.   5. The mitral valve is normal in structure. Mild mitral valve  regurgitation. No evidence of mitral stenosis.   6. The aortic valve is tricuspid. There is mild calcification of the  aortic valve. Aortic valve regurgitation is not visualized. Aortic valve  sclerosis/calcification is present, without any evidence of aortic  stenosis.   7. The inferior vena cava is normal in size with greater than 50%  respiratory variability, suggesting right atrial pressure of 3 mmHg.    Neuro/Psych negative neurological ROS  negative psych ROS   GI/Hepatic Neg liver ROS,GERD  ,,  Endo/Other  diabetes, Type 2    Renal/GU CRFRenal disease Bladder dysfunction      Musculoskeletal  (+) Arthritis , Osteoarthritis,    Abdominal Normal abdominal exam  (+)   Peds  Hematology Lab Results      Component                Value                Date                      WBC                      6.5                 08/13/2023                HGB                      10.1 (L)            08/13/2023                HCT                      31.7 (L)            08/13/2023                MCV                      90.8                08/13/2023                PLT  254                 08/13/2023             Lab Results      Component                Value               Date                      NA                       140                 08/19/2023                K                        4.2                 08/19/2023                CO2                      25                  08/19/2023                GLUCOSE                  194 (H)             08/19/2023                BUN                      30                  08/19/2023                CREATININE               2.43 (H)            08/19/2023                CALCIUM                  8.9                 08/19/2023                EGFR                     24 (L)              08/19/2023                GFRNONAA                 24 (L)              08/13/2023              Anesthesia Other Findings   Reproductive/Obstetrics                             Anesthesia Physical Anesthesia Plan  ASA: 3  Anesthesia Plan:  General   Post-op Pain Management:    Induction: Intravenous  PONV Risk Score and Plan: 2 and Ondansetron, Dexamethasone and Treatment may vary due to age or medical condition  Airway Management Planned: Mask and Oral ETT  Additional Equipment: None  Intra-op Plan:   Post-operative Plan: Extubation in OR  Informed Consent: I have reviewed the patients History and Physical, chart, labs and discussed the procedure including the risks, benefits and alternatives for the proposed anesthesia with the patient or authorized representative who has indicated his/her understanding and acceptance.     Dental advisory given  Plan  Discussed with: CRNA  Anesthesia Plan Comments: (See PAT note 08/13/2023)       Anesthesia Quick Evaluation

## 2023-08-23 NOTE — Progress Notes (Signed)
Anesthesia Chart Review   Case: [id number] Date/Time: 08/26/23 1115   Procedures:      CYSTOSCOPY WITH URETHRAL DILATATION     POSSIBLE TRANSURETHRAL RESECTION OF BLADDER TUMOR (TURBT)     CYSTOSCOPY WITH BILATERAL RETROGRADE PYELOGRAMS POSSIBLE STENT PLACEMENT (Bilateral) - 60 MINS FOR CASE   Anesthesia type: General   Pre-op diagnosis: BLADDER NECK CONTRACTURE   Location: [facility] PROCEDURE [facility] / [facility]   Surgeons: [name], MD       DISCUSSION:89 y.o. former smoker with h/o HTN, PAF, CKD Stage IV, DM II, prostate cancer, bladder neck contracture scheduled for above procedure 08/26/2023 with Dr. Modena Slater.   Pt last seen by cardiology 08/22/23. Per OV note, "Patient's RCRI score is 6.6%   The patient affirms he has been doing well without any new cardiac symptoms. They are able to achieve 5 METS without cardiac limitations. Therefore, based on ACC/AHA guidelines, the patient would be at acceptable risk for the planned procedure without further cardiovascular testing. The patient was advised that if he develops new symptoms prior to surgery to contact our office to arrange for a follow-up visit, and he verbalized understanding.    The patient was advised that if he develops new symptoms prior to surgery to contact our office to arrange for a follow-up visit, and he verbalized understanding.   Patient can hold Eliquis 3 days prior to procedure and should restart postprocedure when surgically safe and guided by performing provider."  Pt reports last dose of Elquis 08/22/23.  VS: BP (!) 160/65 (BP Location: Right Arm)   Pulse (!) 58   Temp 36.7 C (Oral)   Resp 16   Ht 5\' 11"  (1.803 m)   Wt 78.5 kg   SpO2 100%   BMI 24.13 kg/m   PROVIDERS: Kaleen Mask, MD is PCP   Primary Cardiologist:  [name], MD  LABS: Labs reviewed: Acceptable for surgery. (all labs ordered are listed, but only abnormal results are displayed)  Labs Reviewed  HEMOGLOBIN A1C - Abnormal;  Notable for the following components:      Result Value   Hgb A1c MFr Bld 6.1 (*)    All other components within normal limits  BASIC METABOLIC PANEL - Abnormal; Notable for the following components:   Potassium 3.3 (*)    Glucose, Bld 115 (*)    BUN 35 (*)    Creatinine, Ser 2.48 (*)    Calcium 8.1 (*)    GFR, Estimated 24 (*)    All other components within normal limits  CBC - Abnormal; Notable for the following components:   RBC 3.49 (*)    Hemoglobin 10.1 (*)    HCT 31.7 (*)    All other components within normal limits  GLUCOSE, CAPILLARY - Abnormal; Notable for the following components:   Glucose-Capillary 119 (*)    All other components within normal limits     IMAGES:   EKG:   CV: Echo 06/15/2022 1. Left ventricular ejection fraction, by estimation, is 60 to 65%. The  left ventricle has normal function. The left ventricle has no regional  wall motion abnormalities. There is mild left ventricular hypertrophy.  Left ventricular diastolic parameters  are consistent with Grade II diastolic dysfunction (pseudonormalization).   2. Right ventricular systolic function is normal. The right ventricular  size is normal.   3. Left atrial size was severely dilated.   4. Right atrial size was mildly dilated.   5. The mitral valve is normal  in structure. Mild mitral valve  regurgitation. No evidence of mitral stenosis.   6. The aortic valve is tricuspid. There is mild calcification of the  aortic valve. Aortic valve regurgitation is not visualized. Aortic valve  sclerosis/calcification is present, without any evidence of aortic  stenosis.   7. The inferior vena cava is normal in size with greater than 50%  respiratory variability, suggesting right atrial pressure of 3 mmHg.  Past Medical History:  Diagnosis Date   Arthritis    "left hand" (01/08/2017)   Chronic kidney disease    CKD4   Dysrhythmia    A.fib   GERD (gastroesophageal reflux disease)    High cholesterol     History of kidney stones    Hypertension    Osteopenia    PAF (paroxysmal atrial fibrillation) (HCC)    Prostate cancer (HCC) 2005   Type II diabetes mellitus (HCC)     Past Surgical History:  Procedure Laterality Date   CARPAL TUNNEL RELEASE Right 06/28/2016   Procedure: RIGHT CARPAL TUNNEL RELEASE;  Surgeon: Cindee Salt, MD;  Location: [facility];  Service: Orthopedics;  Laterality: Right;  ANESTHESIA:  IV REGIONAL FAB   CYSTOSCOPY W/ STONE MANIPULATION     LAPAROSCOPIC CHOLECYSTECTOMY     PROSTATECTOMY  04/2004   Carl Strong 05/16/2011   TONSILLECTOMY     URETHRAL STRICTURE DILATATION  07/2005   Carl Strong 05/16/2011    MEDICATIONS:  apixaban (ELIQUIS) 5 MG TABS tablet   Ferrous Sulfate Dried (SLOW RELEASE IRON) 45 MG TBCR   glimepiride (AMARYL) 4 MG tablet   glucose blood (ONETOUCH ULTRA) test strip   hydrALAZINE (APRESOLINE) 10 MG tablet   hydrochlorothiazide (HYDRODIURIL) 25 MG tablet   metoprolol succinate (TOPROL-XL) 25 MG 24 hr tablet   potassium chloride SA (KLOR-CON M) 20 MEQ tablet   No current facility-administered medications for this encounter.    [name], PA-C [facility] [phone]

## 2023-08-26 ENCOUNTER — Ambulatory Visit (HOSPITAL_COMMUNITY): Payer: Medicare Other

## 2023-08-26 ENCOUNTER — Encounter (HOSPITAL_COMMUNITY): Payer: Self-pay | Admitting: Urology

## 2023-08-26 ENCOUNTER — Ambulatory Visit (HOSPITAL_COMMUNITY)
Admission: RE | Admit: 2023-08-26 | Discharge: 2023-08-26 | Disposition: A | Discharge status: Home / Self Care | Payer: Medicare Other | Source: Ambulatory Visit | Attending: Urology | Admitting: Urology

## 2023-08-26 ENCOUNTER — Ambulatory Visit (HOSPITAL_BASED_OUTPATIENT_CLINIC_OR_DEPARTMENT_OTHER): Payer: Medicare Other

## 2023-08-26 ENCOUNTER — Ambulatory Visit (HOSPITAL_COMMUNITY): Payer: Self-pay | Admitting: Physician Assistant

## 2023-08-26 ENCOUNTER — Encounter (HOSPITAL_COMMUNITY)
Admission: RE | Disposition: A | Discharge status: Home / Self Care | Payer: Self-pay | Source: Ambulatory Visit | Attending: Urology

## 2023-08-26 DIAGNOSIS — I4891 Unspecified atrial fibrillation: Secondary | ICD-10-CM | POA: Insufficient documentation

## 2023-08-26 DIAGNOSIS — N1330 Unspecified hydronephrosis: Secondary | ICD-10-CM | POA: Insufficient documentation

## 2023-08-26 DIAGNOSIS — E119 Type 2 diabetes mellitus without complications: Secondary | ICD-10-CM | POA: Insufficient documentation

## 2023-08-26 DIAGNOSIS — N35919 Unspecified urethral stricture, male, unspecified site: Secondary | ICD-10-CM | POA: Insufficient documentation

## 2023-08-26 DIAGNOSIS — N3289 Other specified disorders of bladder: Secondary | ICD-10-CM | POA: Insufficient documentation

## 2023-08-26 DIAGNOSIS — C778 Secondary and unspecified malignant neoplasm of lymph nodes of multiple regions: Secondary | ICD-10-CM | POA: Insufficient documentation

## 2023-08-26 DIAGNOSIS — Z9079 Acquired absence of other genital organ(s): Secondary | ICD-10-CM | POA: Insufficient documentation

## 2023-08-26 DIAGNOSIS — N189 Chronic kidney disease, unspecified: Secondary | ICD-10-CM

## 2023-08-26 DIAGNOSIS — N320 Bladder-neck obstruction: Secondary | ICD-10-CM | POA: Diagnosis not present

## 2023-08-26 DIAGNOSIS — I129 Hypertensive chronic kidney disease with stage 1 through stage 4 chronic kidney disease, or unspecified chronic kidney disease: Secondary | ICD-10-CM

## 2023-08-26 DIAGNOSIS — Z79899 Other long term (current) drug therapy: Secondary | ICD-10-CM | POA: Diagnosis not present

## 2023-08-26 DIAGNOSIS — D4959 Neoplasm of unspecified behavior of other genitourinary organ: Secondary | ICD-10-CM | POA: Diagnosis not present

## 2023-08-26 DIAGNOSIS — I10 Essential (primary) hypertension: Secondary | ICD-10-CM | POA: Diagnosis not present

## 2023-08-26 DIAGNOSIS — D494 Neoplasm of unspecified behavior of bladder: Secondary | ICD-10-CM | POA: Diagnosis not present

## 2023-08-26 DIAGNOSIS — C61 Malignant neoplasm of prostate: Secondary | ICD-10-CM | POA: Diagnosis not present

## 2023-08-26 DIAGNOSIS — Z7984 Long term (current) use of oral hypoglycemic drugs: Secondary | ICD-10-CM | POA: Insufficient documentation

## 2023-08-26 DIAGNOSIS — R339 Retention of urine, unspecified: Secondary | ICD-10-CM | POA: Insufficient documentation

## 2023-08-26 DIAGNOSIS — R3912 Poor urinary stream: Secondary | ICD-10-CM | POA: Diagnosis not present

## 2023-08-26 DIAGNOSIS — Z87891 Personal history of nicotine dependence: Secondary | ICD-10-CM | POA: Diagnosis not present

## 2023-08-26 DIAGNOSIS — I480 Paroxysmal atrial fibrillation: Secondary | ICD-10-CM | POA: Diagnosis not present

## 2023-08-26 HISTORY — PX: TRANSURETHRAL RESECTION OF BLADDER TUMOR: SHX2575

## 2023-08-26 HISTORY — PX: CYSTOSCOPY WITH URETHRAL DILATATION: SHX5125

## 2023-08-26 HISTORY — PX: CYSTOSCOPY WITH STENT PLACEMENT: SHX5790

## 2023-08-26 LAB — GLUCOSE, CAPILLARY
Glucose-Capillary: 113 mg/dL — ABNORMAL HIGH (ref 70–99)
Glucose-Capillary: 124 mg/dL — ABNORMAL HIGH (ref 70–99)
Glucose-Capillary: 117 mg/dL — ABNORMAL HIGH (ref 70–99)

## 2023-08-26 SURGERY — CYSTOSCOPY, WITH URETHRAL DILATION
Anesthesia: General

## 2023-08-26 MED ORDER — FENTANYL CITRATE (PF) 100 MCG/2ML IJ SOLN
INTRAMUSCULAR | Status: DC | PRN
  Administered 2023-08-26: 25 ug via INTRAVENOUS
  Administered 2023-08-26: 50 ug via INTRAVENOUS
  Administered 2023-08-26: 25 ug via INTRAVENOUS

## 2023-08-26 MED ORDER — PROPOFOL 10 MG/ML IV BOLUS
INTRAVENOUS | Status: DC | PRN
  Administered 2023-08-26: 120 mg via INTRAVENOUS

## 2023-08-26 MED ORDER — DEXAMETHASONE SODIUM PHOSPHATE 10 MG/ML IJ SOLN
INTRAMUSCULAR | Status: AC
  Filled 2023-08-26: qty 1

## 2023-08-26 MED ORDER — ONDANSETRON HCL 4 MG/2ML IJ SOLN
INTRAMUSCULAR | Status: DC | PRN
  Administered 2023-08-26: 4 mg via INTRAVENOUS

## 2023-08-26 MED ORDER — LIDOCAINE HCL (CARDIAC) PF 100 MG/5ML IV SOSY
PREFILLED_SYRINGE | INTRAVENOUS | Status: DC | PRN
  Administered 2023-08-26: 60 mg via INTRAVENOUS

## 2023-08-26 MED ORDER — ORAL CARE MOUTH RINSE: 15.0000 mL | Freq: Once | OROMUCOSAL | Status: AC

## 2023-08-26 MED ORDER — ROCURONIUM BROMIDE 10 MG/ML (PF) SYRINGE
PREFILLED_SYRINGE | INTRAVENOUS | Status: AC
  Filled 2023-08-26: qty 10

## 2023-08-26 MED ORDER — CEFAZOLIN SODIUM-DEXTROSE 2-4 GM/100ML-% IV SOLN
2.0000 g | INTRAVENOUS | Status: AC
  Administered 2023-08-26: 2 g via INTRAVENOUS
  Filled 2023-08-26: qty 100

## 2023-08-26 MED ORDER — SODIUM CHLORIDE 0.9 % IR SOLN
Status: DC | PRN
  Administered 2023-08-26 (×4): 3000 mL via INTRAVESICAL

## 2023-08-26 MED ORDER — INSULIN ASPART 100 UNIT/ML IJ SOLN: 0.0000 [IU] | INTRAMUSCULAR | Status: DC | PRN

## 2023-08-26 MED ORDER — 0.9 % SODIUM CHLORIDE (POUR BTL) OPTIME
TOPICAL | Status: DC | PRN
  Administered 2023-08-26: 1000 mL

## 2023-08-26 MED ORDER — LACTATED RINGERS IV SOLN: INTRAVENOUS | Status: DC

## 2023-08-26 MED ORDER — DEXAMETHASONE SODIUM PHOSPHATE 10 MG/ML IJ SOLN
INTRAMUSCULAR | Status: DC | PRN
Start: 2023-08-26 — End: 2023-08-26
  Administered 2023-08-26: 4 mg via INTRAVENOUS

## 2023-08-26 MED ORDER — ROCURONIUM BROMIDE 100 MG/10ML IV SOLN
INTRAVENOUS | Status: DC | PRN
  Administered 2023-08-26: 25 mg via INTRAVENOUS
  Administered 2023-08-26: 45 mg via INTRAVENOUS

## 2023-08-26 MED ORDER — ONDANSETRON HCL 4 MG/2ML IJ SOLN
INTRAMUSCULAR | Status: AC
  Filled 2023-08-26: qty 2

## 2023-08-26 MED ORDER — FENTANYL CITRATE PF 50 MCG/ML IJ SOSY: 25.0000 ug | PREFILLED_SYRINGE | INTRAMUSCULAR | Status: DC | PRN

## 2023-08-26 MED ORDER — CHLORHEXIDINE GLUCONATE 0.12 % MT SOLN
15.0000 mL | Freq: Once | OROMUCOSAL | Status: AC
  Administered 2023-08-26: 15 mL via OROMUCOSAL

## 2023-08-26 MED ORDER — IOHEXOL 300 MG/ML  SOLN
INTRAMUSCULAR | Status: DC | PRN
  Administered 2023-08-26: 40 mL

## 2023-08-26 MED ORDER — LABETALOL HCL 5 MG/ML IV SOLN
INTRAVENOUS | Status: DC | PRN
  Administered 2023-08-26: 5 mg via INTRAVENOUS

## 2023-08-26 MED ORDER — LIDOCAINE HCL (PF) 2 % IJ SOLN
INTRAMUSCULAR | Status: AC
  Filled 2023-08-26: qty 5

## 2023-08-26 MED ORDER — HYDROCODONE-ACETAMINOPHEN 5-325 MG PO TABS: 1.0000 | ORAL_TABLET | ORAL | 0 refills | Status: DC | PRN

## 2023-08-26 MED ORDER — FENTANYL CITRATE (PF) 100 MCG/2ML IJ SOLN
INTRAMUSCULAR | Status: AC
  Filled 2023-08-26: qty 2

## 2023-08-26 MED ORDER — LABETALOL HCL 5 MG/ML IV SOLN
INTRAVENOUS | Status: AC
  Filled 2023-08-26: qty 4

## 2023-08-26 MED ORDER — SUGAMMADEX SODIUM 200 MG/2ML IV SOLN
INTRAVENOUS | Status: DC | PRN
  Administered 2023-08-26: 200 mg via INTRAVENOUS

## 2023-08-26 MED ORDER — ACETAMINOPHEN 10 MG/ML IV SOLN: 1000.0000 mg | Freq: Once | INTRAVENOUS | Status: DC | PRN

## 2023-08-26 SURGICAL SUPPLY — 30 items
BAG DRN RND TRDRP ANRFLXCHMBR (UROLOGICAL SUPPLIES) ×2
BAG URINE DRAIN 2000ML AR STRL (UROLOGICAL SUPPLIES) ×2 IMPLANT
BAG URO CATCHER STRL LF (MISCELLANEOUS) ×2 IMPLANT
BALLN NEPHROSTOMY (BALLOONS)
BALLOON NEPHROSTOMY (BALLOONS) IMPLANT
CATH FOLEY 2WAY SLVR 5CC 18FR (CATHETERS) IMPLANT
CATH FOLEY 2WAY SLVR 5CC 20FR (CATHETERS) IMPLANT
CATH URET 5FR 70CM CONE TIP (BALLOONS) IMPLANT
CATH URETL OPEN END 6FR 70 (CATHETERS) ×2 IMPLANT
CLOTH BEACON ORANGE TIMEOUT ST (SAFETY) ×2 IMPLANT
DRAPE FOOT SWITCH (DRAPES) ×2 IMPLANT
ELECT REM PT RETURN 15FT ADLT (MISCELLANEOUS) ×2 IMPLANT
GLOVE BIO SURGEON STRL SZ7.5 (GLOVE) ×2 IMPLANT
GOWN STRL REUS W/ TWL XL LVL3 (GOWN DISPOSABLE) ×2 IMPLANT
GOWN STRL REUS W/TWL XL LVL3 (GOWN DISPOSABLE) ×2
GUIDEWIRE ANG ZIPWIRE 038X150 (WIRE) IMPLANT
GUIDEWIRE STR DUAL SENSOR (WIRE) ×2 IMPLANT
KIT TURNOVER KIT A (KITS) IMPLANT
LOOP CUT BIPOLAR 24F LRG (ELECTROSURGICAL) IMPLANT
MANIFOLD NEPTUNE II (INSTRUMENTS) ×2 IMPLANT
NS IRRIG 1000ML POUR BTL (IV SOLUTION) IMPLANT
PACK CYSTO (CUSTOM PROCEDURE TRAY) ×2 IMPLANT
PENCIL SMOKE EVACUATOR (MISCELLANEOUS) IMPLANT
PLUG CATH AND CAP STRL 200 (CATHETERS) IMPLANT
STENT URET 6FRX28 CONTOUR (STENTS) IMPLANT
SYR TOOMEY IRRIG 70ML (MISCELLANEOUS)
SYRINGE TOOMEY IRRIG 70ML (MISCELLANEOUS) IMPLANT
TUBING CONNECTING 10 (TUBING) ×2 IMPLANT
TUBING UROLOGY SET (TUBING) ×2 IMPLANT
WATER STERILE IRR 3000ML UROMA (IV SOLUTION) ×2 IMPLANT

## 2023-08-26 NOTE — Op Note (Signed)
Operative Note  Preoperative diagnosis:  1.  Bladder neck contracture 2.  Possible bladder tumor 3.  Left hydronephrosis  Postoperative diagnosis: 1.  Bladder neck contracture 2.  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  Left hydronephrosis secondary to extrinsic compression and prostate cancer overgrowth  Procedure(s): 1.  Cystoscopy with urethral dilation  2.  Bilateral retrograde pyelogram 3.  Left ureteral stent placement 4.  Transurethral resection of bladder tumor--large  Surgeon: Modena Slater, MD  Assistants: None  Anesthesia: General  Complications: None immediate  EBL: Minimal  Specimens: 1.  Bladder neck tumor  Drains/Catheters: 1.  20 French Foley catheter  Intraoperative findings: 1.  Normal anterior urethra 2.  Tight bladder neck contracture that was able to be dilated over wire with the cystoscope followed by the resectoscope and was then wide open 3.  Right ureteral orifice was free of any tumor involvement.  Retrograde pyelogram on the right revealed no hydroureteronephrosis and no filling defect.  At the midline of the bladder neck extending to the left was extensive tumor about 5 cm.  This covered the left ureteral orifice.  I was able to unroofed the left ureteral orifice after resection of the bladder tumor.  Left retrograde pyelogram revealed severe hydroureteronephrosis.  The ureter was tortuous.  Difficult to access the kidney but ultimately able to get a wire to the kidney followed by stent placement.  Indication: 89 year old male with a history of prostate cancer status post radical retropubic prostatectomy in the remote past.  He was found on imaging to have likely recurrence with left-sided hydronephrosis.  PSA was 177.  He was initiated on ADT.  Cystoscopy in the office showed a bladder neck contracture and therefore he presents for previously mentioned operation.  Description of procedure:  The patient was identified and consent was  obtained.  The patient was taken to the operating room and placed in the supine position.  The patient was placed under general anesthesia.  Perioperative antibiotics were administered.  The patient was placed in dorsal lithotomy.  Patient was prepped and draped in a standard sterile fashion and a timeout was performed.  A 21 French rigid cystoscope was Faylene Million into the urethra and up to the bladder neck.  There is a tight bladder neck contracture and I passed a wire through this and into the bladder.  I was unable to drive the cystoscope over the wire and dilate the bladder neck contracture.  I performed a complete cystoscopy.  He had moderate trabeculation and then the tumor as noted above.  The right ureteral orifice was able to be identified and I intubated the right ureteral orifice with an open-ended ureteral catheter and a retrograde pyelogram was performed with no abnormal findings.  I could not find the left ureteral orifice.  There was also some bleeding from the tumor.  Therefore, I exchanged for the 26 French resectoscope with visual obturator in place and advanced that into the urethra and again dilated the bladder neck with the resectoscope and at this point it was wide open.  I exchanged for the bipolar working element and resected the bladder neck tumor and fulgurated the resection bed.  I resected from the midline of the trigone towards the left lateral wall and left bladder neck.  This took care of any significant bleeding and opened up the bladder neck further.  I collected the tumor specimen.  I fulgurated any bleeding.  At this point, I was able to identify the left ureteral orifice  that had been unroofed.  I exchanged for the cystoscope.  I advanced a wire up the left ureter followed by advancement of an open-ended ureteral catheter over the wire into the distal ureter.  I shot a retrograde pyelogram that showed contrast extending to the mid ureter.  No contrast extended beyond this.  Ureter  appeared tortuous.  I advanced a sensor wire up followed by advancement of the open-ended ureteral catheter over the wire to the more proximal ureter.  I could not advance the sensor wire any furthe so I removed the wire and shot a retrograde pyelogram through the open-ended ureteral catheter which outlined the remainder of the ureter and kidney which was tortuous with severe hydroureteronephrosis.  I was able to navigate a Glidewire through the open-ended ureteral catheter and into the kidney and withdrew the open-ended ureteral catheter.  I then placed a 6 x 28 double-J ureteral stent in standard fashion followed by removal of the wire.  Fluoroscopy confirmed proximal placement.  The distal end migrated into the ureter but I was able to grasp this with a grasper and situate the coil within the bladder.  I confirmed again that the proximal portion was in proper position and it was.  There was a good distal coil in the bladder as well.  I drained the bladder and withdrew the scope.  Bladder neck was wide open.  I then placed a 20 French Foley catheter.  This concluded the operation.  Patient tolerated the procedure well with stable postoperative.  Plan: Follow-up in 1 week for pathology review and obtain BMP.

## 2023-08-26 NOTE — Transfer of Care (Signed)
Immediate Anesthesia Transfer of Care Note  Patient: Carl Strong  Procedure(s) Performed: CYSTOSCOPY WITH URETHRAL DILATATION POSSIBLE TRANSURETHRAL RESECTION OF BLADDER TUMOR (TURBT) CYSTOSCOPY WITH BILATERAL RETROGRADE PYELOGRAMS LEFT STENT PLACEMENT (Bilateral)  Patient Location: PACU  Anesthesia Type:General  Level of Consciousness: awake and patient cooperative  Airway & Oxygen Therapy: Patient Spontanous Breathing and Patient connected to nasal cannula oxygen  Post-op Assessment: Report given to RN and Post -op Vital signs reviewed and stable  Post vital signs: Reviewed and stable  Last Vitals:  Vitals Value Taken Time  BP 185/107 08/26/23 1207  Temp 36.6 C 08/26/23 1207  Pulse 57 08/26/23 1211  Resp 14 08/26/23 1211  SpO2 98 % 08/26/23 1211  Vitals shown include unfiled device data.  Last Pain:  Vitals:   08/26/23 1207  TempSrc:   PainSc: 0-No pain         Complications: No notable events documented.

## 2023-08-26 NOTE — H&P (Signed)
CC/HPI: CC: History of prostate cancer  HPI:  89 year old male with a history of prostate cancer status post radical retropubic prostatectomy in 2005 for Gleason 7 prostate cancer. Has a history urethral stricture status post dilation in 2006 and 2009. Patient was recently in the hospital and underwent a CT scan of the abdomen pelvis that revealed invasive left-sided prostate mass extending along the base of the bladder and possibly anterior rectal wall. There was associated collecting system dilation of the left kidney greater than the right. There were several abnormal lymph nodes throughout the retroperitoneum and left pelvic sidewall. He is having difficulty with voiding with dribbling and weak stream. Has a history of urethral stricture.     ALLERGIES: [place] TABS    MEDICATIONS: Hydrochlorothiazide  Metoprolol Tartrate 25 mg tablet  Eliquis 5 mg tablet  Glimepiride 4 mg tablet  Hydralazine Hcl  Potassium     GU PSH: Cysto Bladder Stone >2.5cm - 2008 Extensive Prostate Surgery - 2010       PSH Notes: Prostatectomy Retropubic Radical With Nerve Sparing, Cystoscopy With Fragmentation Of Bladder Calculus Over 2.5cm   NON-GU PSH: No Non-GU PSH    GU PMH: Prostate Cancer, Prostate cancer - 2015 Urethral Stricture, Unspec, Urethral stricture - 2015 Acute Cystitis/UTI, Acute Cystitis - 2014 Dysuria, Dysuria - 2014, Dysuria, - 2014 Encounter for Prostate Cancer screening, Visit For: Screening Exam Malignant Neoplasm Prostate - 2014 History of prostate cancer, Prostate Cancer - 2014 Hydroureter, Hydroureter - 2014 Nocturia, Nocturia - 2014 Personal Hx Oth Urinary System diseases, History of urethral stricture - 2014 Testicular pain, unspecified, Testicular Pain - 2014 Ureteral calculus, Ureteral Stone - 2014 Urinary Frequency, Increased urinary frequency - 2014 Urinary Urgency, Urinary urgency - 2014      PMH Notes:  2007-12-15 09:37:26 - Note: Bladder Calculus  2008-04-05  12:00:00 - Note: Flank Pain Right  2009-04-12 09:34:03 - Note: Recent Methicillin-resistant Staphylococcus Aureus Infection   NON-GU PMH: Encounter for general adult medical examination without abnormal findings, Encounter for preventive health examination - 2015 Personal history of other diseases of the circulatory system, History of hypertension - 2014 Personal history of other diseases of the nervous system and sense organs, History of sleep apnea - 2014 Personal history of other endocrine, nutritional and metabolic disease, History of diabetes mellitus - 2014, History of hypercholesterolemia, - 2014 Personal history of other specified conditions, History of urinary frequency - 2014 Arthritis Cardiac murmur, unspecified Diabetes Type 2 Hypertension    FAMILY HISTORY: 1 Daughter - Other 1 son - Other   SOCIAL HISTORY: Marital Status: Widowed Preferred Language: English; Race: White Current Smoking Status: Patient does not smoke anymore. Has not smoked since 06/30/1998.   Tobacco Use Assessment Completed: Used Tobacco in last 30 days? Drinks 1 caffeinated drink per day.     Notes: Caffeine Use, Alcohol Use, Occupation:, Marital History - Widowed, Tobacco Use, Death In The Family Mother, Death In The Family Father   REVIEW OF SYSTEMS:    GU Review Male:   Patient reports frequent urination, hard to postpone urination, burning/ pain with urination, get up at night to urinate, leakage of urine, stream starts and stops, trouble starting your stream, have to strain to urinate , erection problems, and penile pain.   Gastrointestinal (Upper):   Patient denies nausea, vomiting, and indigestion/ heartburn.  Gastrointestinal (Lower):   Patient denies diarrhea and constipation.  Constitutional:   Patient reports fatigue. Patient denies fever, night sweats, and weight loss.  Skin:   Patient denies  skin rash/ lesion and itching.  Eyes:   Patient reports blurred vision and double vision.   Ears/  Nose/ Throat:   Patient denies sore throat and sinus problems.  Hematologic/Lymphatic:   Patient denies swollen glands and easy bruising.  Cardiovascular:   Patient reports leg swelling. Patient denies chest pains.  Respiratory:   Patient reports cough and shortness of breath.   Endocrine:   Patient reports excessive thirst.   Musculoskeletal:   Patient reports back pain. Patient denies joint pain.  Neurological:   Patient reports dizziness. Patient denies headaches.  Psychologic:   Patient denies depression and anxiety.   Notes: weak stream    VITAL SIGNS: None   GU PHYSICAL EXAMINATION:    Anus and Perineum: No hemorrhoids. No anal stenosis. No rectal fissure, no anal fissure. No edema, no dimple, no perineal tenderness, no anal tenderness.  Scrotum: No lesions. No edema. No cysts. No warts.  Epididymides: Right: no spermatocele, no masses, no cysts, no tenderness, no induration, no enlargement. Left: no spermatocele, no masses, no cysts, no tenderness, no induration, no enlargement.  Testes: No tenderness, no swelling, no enlargement left testes. No tenderness, no swelling, no enlargement right testes. Normal location left testes. Normal location right testes. No mass, no cyst, no varicocele, no hydrocele left testes. No mass, no cyst, no varicocele, no hydrocele right testes.  Urethral Meatus: Normal size. No lesion, no wart, no discharge, no polyp. Normal location.  Penis: Circumcised, no warts, no cracks. No dorsal Peyronie's plaques, no left corporal Peyronie's plaques, no right corporal Peyronie's plaques, no scarring, no warts. No balanitis, no meatal stenosis.  Prostate: 40 gram or 2+ size. Left lobe normal consistency, right lobe normal consistency. Symmetrical lobes. No prostate nodule. Left lobe no tenderness, right lobe no tenderness.  Seminal Vesicles: Nonpalpable.  Sphincter Tone: Normal sphincter. No rectal tenderness. No rectal mass.    MULTI-SYSTEM PHYSICAL EXAMINATION:     Constitutional: Well-nourished. No physical deformities. Normally developed. Good grooming.  Neck: Neck symmetrical, not swollen. Normal tracheal position.  Respiratory: No labored breathing, no use of accessory muscles.   Cardiovascular: Normal temperature, normal extremity pulses, no swelling, no varicosities.  Lymphatic: No enlargement of neck, axillae, groin.  Skin: No paleness, no jaundice, no cyanosis. No lesion, no ulcer, no rash.  Neurologic / Psychiatric: Oriented to time, oriented to place, oriented to person. No depression, no anxiety, no agitation.  Gastrointestinal: No mass, no tenderness, no rigidity, non obese abdomen.  Eyes: Normal conjunctivae. Normal eyelids.  Ears, Nose, Mouth, and Throat: Left ear no scars, no lesions, no masses. Right ear no scars, no lesions, no masses. Nose no scars, no lesions, no masses. Normal hearing. Normal lips.  Musculoskeletal: Normal gait and station of head and neck.     Complexity of Data:   08/18/14 05/11/13 04/30/12 11/15/11 05/22/11 05/01/10 04/12/09 10/26/08  PSA  Total PSA 1.21  0.66  0.46  0.36  0.24  0.10  <0.04  <0.04     PROCEDURES:         Flexible Cystoscopy - 52000  Risks, benefits, and some of the potential complications of the procedure were discussed at length with the patient including infection, bleeding, voiding discomfort, urinary retention, fever, chills, sepsis, and others. All questions were answered. Informed consent was obtained. Antibiotic prophylaxis was given. Sterile technique and intraurethral analgesia were used.  Meatus:  Normal size. Normal location. Normal condition.  Urethra:  No strictures.  Bladder Neck:  Patient had bladder neck contracture, unable  to pass the scope      The lower urinary tract was carefully examined. The procedure was well-tolerated and without complications. Antibiotic instructions were given. Instructions were given to call the office immediately for bloody urine, difficulty  urinating, urinary retention, painful or frequent urination, fever, chills, nausea, vomiting or other illness. The patient stated that he understood these instructions and would comply with them.         Urinalysis Dipstick Dipstick Cont'd  Color: Yellow Bilirubin: Neg mg/dL  Appearance: Clear Ketones: Neg mg/dL  Specific Gravity: 9.604 Blood: Neg ery/uL  pH: <=5.0 Protein: Trace mg/dL  Glucose: Neg mg/dL Urobilinogen: 0.2 mg/dL    Nitrites: Neg    Leukocyte Esterase: Neg leu/uL    ASSESSMENT:      ICD-10 Details  1 GU:   Prostate Cancer - C61 Chronic, Stable  2   Weak Urinary Stream - R39.12 Undiagnosed New Problem  3   Incomplete bladder emptying - R39.14 Undiagnosed New Problem  4 NON-GU:   Metastatic lymphadenopathy of multiple regions - C77.8 Undiagnosed New Problem  5   Other urethral stricture, male, unspecified site - N35.819 Undiagnosed New Problem   PLAN:           Orders Labs PSA  X-Rays: PET- PSMA Scan          Schedule Return Visit/Planned Activity: ASAP - PET- PSMA Scan          Document Letter(s):  Created for Patient: Clinical Summary         Notes:   Patient has bladder neck contracture. Will plan for cystoscopy with urethral dilation. He also has possible bladder mass versus prostate cancer recurrence. Will obtain possible TURBT, bilateral retrograde pyelogram with possible ureteral stent placements. Risk benefits discussed.   PSA today. Will also obtain a PSMA PET scan.   CC: Dr. Pete Glatter    Signed by Modena Slater, III, M.D. on 07/31/23 at 2:59 PM (EDT)     APPENDED NOTES:    psa is 177, consistent wth metastatic prostate cancer. we will get him in for firmagon asap    Signed by Modena Slater, III, M.D. on 08/05/23 at 7:31 PM (EDT

## 2023-08-26 NOTE — Anesthesia Procedure Notes (Signed)
Procedure Name: Intubation Date/Time: 08/26/2023 10:48 AM  Performed by: Sampson Goon, CRNAPre-anesthesia Checklist: Patient identified, Emergency Drugs available, Suction available, Patient being monitored and Timeout performed Patient Re-evaluated:Patient Re-evaluated prior to induction Oxygen Delivery Method: Circle system utilized Preoxygenation: Pre-oxygenation with 100% oxygen Induction Type: IV induction Ventilation: Two handed mask ventilation required and Oral airway inserted - appropriate to patient size Laryngoscope Size: Mac and 3 Grade View: Grade I Tube type: Oral Endobronchial tube: Right and EBT position confirmed by auscultation Tube size: 7.5 mm Number of attempts: 1 Airway Equipment and Method: Stylet and Oral airway Placement Confirmation: ETT inserted through vocal cords under direct vision, positive ETCO2, breath sounds checked- equal and bilateral and CO2 detector Secured at: 22 cm Tube secured with: Tape Dental Injury: Teeth and Oropharynx as per pre-operative assessment

## 2023-08-26 NOTE — Anesthesia Postprocedure Evaluation (Signed)
Anesthesia Post Note  Patient: Carl Strong  Procedure(s) Performed: CYSTOSCOPY WITH URETHRAL DILATATION POSSIBLE TRANSURETHRAL RESECTION OF BLADDER TUMOR (TURBT) CYSTOSCOPY WITH BILATERAL RETROGRADE PYELOGRAMS LEFT STENT PLACEMENT (Bilateral)     Patient location during evaluation: PACU Anesthesia Type: General Level of consciousness: awake and alert Pain management: pain level controlled Vital Signs Assessment: post-procedure vital signs reviewed and stable Respiratory status: spontaneous breathing, nonlabored ventilation, respiratory function stable and patient connected to nasal cannula oxygen Cardiovascular status: blood pressure returned to baseline and stable Postop Assessment: no apparent nausea or vomiting Anesthetic complications: no   No notable events documented.  Last Vitals:  Vitals:   08/26/23 1245 08/26/23 1300  BP: (!) 185/69 (!) 190/64  Pulse: (!) 59 62  Resp: 19 17  Temp:  36.4 C  SpO2: 97% 99%    Last Pain:  Vitals:   08/26/23 1300  TempSrc:   PainSc: 0-No pain                 Earl Lites P Duong Haydel

## 2023-08-26 NOTE — Discharge Instructions (Addendum)
Transurethral Resection of Bladder Tumor (TURBT) or Bladder Biopsy  Restart eliquis in 3 days if urine is clear  Definition:  Transurethral Resection of the Bladder Tumor is a surgical procedure used to diagnose and remove tumors within the bladder. TURBT is the most common treatment for early stage bladder cancer.  General instructions:     Your recent bladder surgery requires very little post hospital care but some definite precautions.  Despite the fact that no skin incisions were used, the area around the bladder incisions are raw and covered with scabs to promote healing and prevent bleeding. Certain precautions are needed to insure that the scabs are not disturbed over the next 2-4 weeks while the healing proceeds.  Because the raw surface inside your bladder and the irritating effects of urine you may expect frequency of urination and/or urgency (a stronger desire to urinate) and perhaps even getting up at night more often. This will usually resolve or improve slowly over the healing period. You may see some blood in your urine over the first 6 weeks. Do not be alarmed, even if the urine was clear for a while. Get off your feet and drink lots of fluids until clearing occurs. If you start to pass clots or don't improve call us.  Diet:  You may return to your normal diet immediately. Because of the raw surface of your bladder, alcohol, spicy foods, foods high in acid and drinks with caffeine may cause irritation or frequency and should be used in moderation. To keep your urine flowing freely and avoid constipation, drink plenty of fluids during the day (8-10 glasses). Tip: Avoid cranberry juice because it is very acidic.  Activity:  Your physical activity doesn't need to be restricted. However, if you are very active, you may see some blood in the urine. We suggest that you reduce your activity under the circumstances until the bleeding has stopped.  Bowels:  It is important to keep  your bowels regular during the postoperative period. Straining with bowel movements can cause bleeding. A bowel movement every other day is reasonable. Use a mild laxative if needed, such as milk of magnesia 2-3 tablespoons, or 2 Dulcolax tablets. Call if you continue to have problems. If you had been taking narcotics for pain, before, during or after your surgery, you may be constipated. Take a laxative if necessary.    Medication:  You should resume your pre-surgery medications unless told not to. In addition you may be given an antibiotic to prevent or treat infection. Antibiotics are not always necessary. All medication should be taken as prescribed until the bottles are finished unless you are having an unusual reaction to one of the drugs.  [facility] [phone] Post Ureteroscopy With or Without Stent Instructions  Definitions:  Ureter: The duct that transports urine from the kidney to the bladder. Stent:   A plastic hollow tube that is placed into the ureter, from the kidney to the                 bladder to prevent the ureter from swelling shut.  GENERAL INSTRUCTIONS:  Despite the fact that no skin incisions were used, the area around the ureter and bladder is raw and irritated. The stent is a foreign body which will further irritate the bladder wall. This irritation is manifested by increased frequency of urination, both day and night, and by an increase in the urge to urinate. In some, the urge to urinate is present almost always. Sometimes  the urge is strong enough that you may not be able to stop yourself from urinating. The only real cure is to remove the stent and then give time for the bladder wall to heal which can't be done until the danger of the ureter swelling shut has passed, which varies.  You may see some blood in your urine while the stent is in place and a few days afterwards. Do not be alarmed, even if the urine was clear for a while. Get off your  feet and drink lots of fluids until clearing occurs. If you start to pass clots or don't improve, call us.  DIET: You may return to your normal diet immediately. Because of the raw surface of your bladder, alcohol, spicy foods, acid type foods and drinks with caffeine may cause irritation or frequency and should be used in moderation. To keep your urine flowing freely and to avoid constipation, drink plenty of fluids during the day ( 8-10 glasses ). Tip: Avoid cranberry juice because it is very acidic.  ACTIVITY: Your physical activity doesn't need to be restricted. However, if you are very active, you may see some blood in your urine. We suggest that you reduce your activity under these circumstances until the bleeding has stopped.  BOWELS: It is important to keep your bowels regular during the postoperative period. Straining with bowel movements can cause bleeding. A bowel movement every other day is reasonable. Use a mild laxative if needed, such as Milk of Magnesia 2-3 tablespoons, or 2 Dulcolax tablets. Call if you continue to have problems. If you have been taking narcotics for pain, before, during or after your surgery, you may be constipated. Take a laxative if necessary.   MEDICATION: You should resume your pre-surgery medications unless told not to. You may take oxybutynin or flomax if prescribed for bladder spasms or discomfort from the stent Take pain medication as directed for pain refractory to conservative management  PROBLEMS YOU SHOULD REPORT TO [place]: Fevers over 100.5 Fahrenheit. Heavy bleeding, or clots ( See above notes about blood in urine ). Inability to urinate. Drug reactions ( hives, rash, nausea, vomiting, diarrhea ). Severe burning or pain with urination that is not improving.

## 2023-08-27 ENCOUNTER — Encounter (HOSPITAL_COMMUNITY): Payer: Self-pay | Admitting: Urology

## 2023-08-28 LAB — SURGICAL PATHOLOGY

## 2023-09-04 DIAGNOSIS — C61 Malignant neoplasm of prostate: Secondary | ICD-10-CM | POA: Diagnosis not present

## 2023-09-04 DIAGNOSIS — N130 Hydronephrosis with ureteropelvic junction obstruction: Secondary | ICD-10-CM | POA: Diagnosis not present

## 2023-09-04 DIAGNOSIS — C778 Secondary and unspecified malignant neoplasm of lymph nodes of multiple regions: Secondary | ICD-10-CM | POA: Diagnosis not present

## 2023-09-04 DIAGNOSIS — C7951 Secondary malignant neoplasm of bone: Secondary | ICD-10-CM | POA: Diagnosis not present

## 2023-09-05 DIAGNOSIS — I10 Essential (primary) hypertension: Secondary | ICD-10-CM | POA: Diagnosis not present

## 2023-09-05 DIAGNOSIS — R609 Edema, unspecified: Secondary | ICD-10-CM | POA: Diagnosis not present

## 2023-09-16 DIAGNOSIS — C61 Malignant neoplasm of prostate: Secondary | ICD-10-CM | POA: Diagnosis not present

## 2023-09-19 DIAGNOSIS — C61 Malignant neoplasm of prostate: Secondary | ICD-10-CM | POA: Diagnosis not present

## 2023-09-19 DIAGNOSIS — Z5111 Encounter for antineoplastic chemotherapy: Secondary | ICD-10-CM | POA: Diagnosis not present

## 2023-09-19 DIAGNOSIS — C7951 Secondary malignant neoplasm of bone: Secondary | ICD-10-CM | POA: Diagnosis not present

## 2023-09-30 ENCOUNTER — Ambulatory Visit: Payer: Medicare Other | Admitting: Adult Health

## 2023-10-01 ENCOUNTER — Encounter: Payer: Self-pay | Admitting: Nurse Practitioner

## 2023-10-01 ENCOUNTER — Ambulatory Visit: Payer: Medicare Other | Admitting: Nurse Practitioner

## 2023-10-01 VITALS — BP 120/60 | HR 66 | Ht 71.0 in | Wt 164.2 lb

## 2023-10-01 DIAGNOSIS — C61 Malignant neoplasm of prostate: Secondary | ICD-10-CM

## 2023-10-01 DIAGNOSIS — R195 Other fecal abnormalities: Secondary | ICD-10-CM

## 2023-10-01 NOTE — Patient Instructions (Signed)
_______________________________________________________  If your blood pressure at your visit was 140/90 or greater, please contact your primary care physician to follow up on this. _______________________________________________________  If you are age 87 or older, your body mass index should be between 23-30. Your Body mass index is 22.91 kg/m. If this is out of the aforementioned range listed, please consider follow up with your Primary Care Provider. _______________________________________________________  The [facility] providers would like to encourage you to use [facility] to communicate with providers for non-urgent requests or questions.  Due to long hold times on the telephone, sending your provider a message by [facility] may be a faster and more efficient way to get a response.  Please allow 48 business hours for a response.  Please remember that this is for non-urgent requests.  _______________________________________________________  Please call our office if you have any overt bleeding.  You will follow up in our office on an as needed basis.  Thank you for entrusting me with your care and choosing [facility].  Willette Cluster, NP

## 2023-10-01 NOTE — Progress Notes (Signed)
ASSESSMENT & PLAN   89 y.o. yo male , new to the practice referred for PCP for a positive hemosure. He has a history of AFib, HTN, diabetes, recurrent prostate cancer with metastatic disease   Positive Hemosure.  Possibly related to local invasion of prostate cancer into rectum. Possibly occult GI blood loss from a colon polyp. Possibly a false / clinically insignificant result.  No overt GI blood loss. No focal GI symptoms, even despite possible rectal involvement of prostate cancer on PET.  --Patient is 83 years old and has metastatic prostate cancer. He isn't really interested in a colonoscopy which is reasonable. His daughter participated in the discussion. I did ask him to let us know if he does develop overt GI bleeding.   Recurrent prostate cancer with suspected local invasion of rectum , left ureteral obstruction and widespread metastatic disease. He is s/p urethral dilation, left ureteral stent placement and transurethral resection of large bladder tumor in late August.  Sounds like he isn't interested in cancer treatment if offered   HPI   Chief complaint :  None   Patient referred by PCP for a positive Hemosure.  He denies any overt GI blood loss.  Denies bowel changes or abdominal pain.  He has recurrent prostate cancer.  PET scan in August showed recurrent prostate cancer with rectal involvement and evidence for metastatic disease to chest and multiple bones. In late August he had a cystoscopy with urethral dilation, left ureteral stent placement and transurethral resection of a large bladder tumor.   PET 08/21/23 IMPRESSION: 1. Intense radiotracer activity within the prostatectomy bed consistent with local prostate cancer recurrence. Intense radiotracer avid soft tissue involves the bladder and rectum consistent with local metastatic disease. 2. Evidence of nodal metastasis with bulky radiotracer avid lymph nodes in the pelvis and periaortic retroperitoneum. 3. Evidence  of nodal metastasis with radiotracer avid lymph nodes in the LEFT supra clavicular space, retrocrural space and middle mediastinum. 4. Evidence of multifocal skeletal metastasis with radiotracer avid skeletal lesions involving the pelvis, spine, ribs, sternum and RIGHT humerus and RIGHT femur. Approximately 25 skeletal lesions    Previous GI Studies   none      Latest Ref Rng & Units 08/13/2023   10:08 AM 06/21/2023    3:48 PM 06/22/2022   12:39 PM  CBC  WBC 4.0 - 10.5 K/uL 6.5  5.9  5.5   Hemoglobin 13.0 - 17.0 g/dL 27.2  9.7  53.6   Hematocrit 39.0 - 52.0 % 31.7  30.6  38.9   Platelets 150 - 400 K/uL 254  254  248     Lab Results  Component Value Date   LIPASE 36 06/21/2023      Latest Ref Rng & Units 08/19/2023   10:44 AM 08/13/2023   10:08 AM 06/21/2023    3:48 PM  CMP  Glucose 70 - 99 mg/dL 644  034  742   BUN 10 - 36 mg/dL 30  35  41   Creatinine 0.76 - 1.27 mg/dL 5.95  6.38  7.56   Sodium 134 - 144 mmol/L 140  138  138   Potassium 3.5 - 5.2 mmol/L 4.2  3.3  3.5   Chloride 96 - 106 mmol/L 102  109  102   CO2 20 - 29 mmol/L 25  22  26    Calcium 8.6 - 10.2 mg/dL 8.9  8.1  8.4   Total Protein 6.5 - 8.1 g/dL   6.8   Total  Bilirubin 0.3 - 1.2 mg/dL   0.5   Alkaline Phos 38 - 126 U/L   59   AST 15 - 41 U/L   18   ALT 0 - 44 U/L   14      DG C-Arm 1-60 Min-No Report Fluoroscopy was utilized by the requesting physician.  No radiographic  interpretation.     Past Medical History:  Diagnosis Date   Arthritis    "left hand" (01/08/2017)   Chronic kidney disease    CKD4   Dysrhythmia    A.fib   GERD (gastroesophageal reflux disease)    High cholesterol    History of kidney stones    Hypertension    Osteopenia    PAF (paroxysmal atrial fibrillation) (HCC)    Prostate cancer (HCC) 2005   Type II diabetes mellitus (HCC)    Past Surgical History:  Procedure Laterality Date   CARPAL TUNNEL RELEASE Right 06/28/2016   Procedure: RIGHT CARPAL TUNNEL RELEASE;   Surgeon: Cindee Salt, MD;  Location:  SURGERY CENTER;  Service: Orthopedics;  Laterality: Right;  ANESTHESIA:  IV REGIONAL FAB   CYSTOSCOPY W/ STONE MANIPULATION     CYSTOSCOPY WITH STENT PLACEMENT Bilateral 08/26/2023   Procedure: CYSTOSCOPY WITH BILATERAL RETROGRADE PYELOGRAMS LEFT STENT PLACEMENT;  Surgeon: [name], MD;  Location: [facility];  Service: Urology;  Laterality: Bilateral;  60 MINS FOR CASE   CYSTOSCOPY WITH URETHRAL DILATATION N/A 08/26/2023   Procedure: CYSTOSCOPY WITH URETHRAL DILATATION;  Surgeon: [name], MD;  Location: [facility];  Service: Urology;  Laterality: N/A;   LAPAROSCOPIC CHOLECYSTECTOMY     PROSTATECTOMY  04/2004   [name] 05/16/2011   TONSILLECTOMY     TRANSURETHRAL RESECTION OF BLADDER TUMOR N/A 08/26/2023   Procedure: POSSIBLE TRANSURETHRAL RESECTION OF BLADDER TUMOR (TURBT);  Surgeon: [name], MD;  Location: [facility];  Service: Urology;  Laterality: N/A;   URETHRAL STRICTURE DILATATION  07/2005   [name] 05/16/2011   History reviewed. No pertinent family history. Social History   Tobacco Use   Smoking status: Former    Current packs/day: 0.00    Average packs/day: 2.0 packs/day for 43.0 years (86.0 ttl pk-yrs)    Types: Cigarettes    Start date: 02/14/1948    Quit date: 02/13/1991    Years since quitting: 32.6   Smokeless tobacco: Never  Vaping Use   Vaping status: Never Used  Substance Use Topics   Alcohol use: No   Drug use: No   Current Outpatient Medications  Medication Sig Dispense Refill   ELIQUIS 2.5 MG TABS tablet Take 2.5 mg by mouth 2 (two) times daily.     Ferrous Sulfate Dried (SLOW RELEASE IRON) 45 MG TBCR Take 45 mg by mouth in the morning.     glimepiride (AMARYL) 4 MG tablet Take 1 tablet (4 mg total) by mouth daily with breakfast. 30 tablet    glucose blood (ONETOUCH ULTRA) test strip USE 1 STRIP TO CHECK GLUCOSE ONCE DAILY Dx: E11.42     hydrALAZINE (APRESOLINE) 10 MG tablet Take 20 mg by mouth in the  morning and at bedtime.     hydrochlorothiazide (HYDRODIURIL) 25 MG tablet Take 25 mg by mouth every morning.     metoprolol succinate (TOPROL-XL) 25 MG 24 hr tablet Take 0.5 tablets (12.5 mg total) by mouth daily. 15 tablet 1   potassium chloride SA (KLOR-CON M) 20 MEQ tablet Take 20 mEq by mouth in the morning.  No current facility-administered medications for this visit.   Allergies  Allergen Reactions   Crestor [Rosuvastatin]     Other reaction(s): muscle pain   Ezetimibe Other (See Comments)    unknown   Jardiance [Empagliflozin] Nausea And Vomiting and Other (See Comments)    weakness   Lipitor [Atorvastatin] Other (See Comments)    muscle pain     Review of Systems: Positive for fatigue and urine leakage . All other systems reviewed and negative except where noted in HPI.   Wt Readings from Last 3 Encounters:  10/01/23 164 lb 4 oz (74.5 kg)  08/26/23 173 lb (78.5 kg)  08/13/23 173 lb (78.5 kg)    Physical Exam:  BP 120/60 (BP Location: Right Arm, Patient Position: Sitting, Cuff Size: Normal)   Pulse 66   Ht 5\' 11"  (1.803 m)   Wt 164 lb 4 oz (74.5 kg)   BMI 22.91 kg/m  Constitutional:  Pleasant, generally well appearing male in no acute distress. Psychiatric:  Normal mood and affect. Behavior is normal. EENT: Pupils normal.  Conjunctivae are normal. No scleral icterus. Neck supple.  Cardiovascular: Normal rate, regular rhythm.  Pulmonary/chest: Effort normal and breath sounds normal. No wheezing, rales or rhonchi. Abdominal: Soft, nondistended, nontender. Bowel sounds active throughout. There are no masses palpable.  Neurological: Alert and oriented to person place and time. Extremities:  2+ RLE edema, 1+ LLE edema Skin: Skin is warm and dry. No rashes noted.  Willette Cluster, NP  10/01/2023, 2:58 PM  Cc:  Referring Provider Kaleen Mask, *

## 2023-10-05 NOTE — Progress Notes (Signed)
Agree with assessment/plan.  Raj Florestine Carmical, MD [facility] [phone]  

## 2023-10-16 DIAGNOSIS — Z23 Encounter for immunization: Secondary | ICD-10-CM | POA: Diagnosis not present

## 2023-10-29 DIAGNOSIS — I4891 Unspecified atrial fibrillation: Secondary | ICD-10-CM | POA: Diagnosis not present

## 2023-10-29 DIAGNOSIS — I10 Essential (primary) hypertension: Secondary | ICD-10-CM | POA: Diagnosis not present

## 2023-10-29 DIAGNOSIS — I509 Heart failure, unspecified: Secondary | ICD-10-CM | POA: Diagnosis not present

## 2023-10-29 DIAGNOSIS — E785 Hyperlipidemia, unspecified: Secondary | ICD-10-CM | POA: Diagnosis not present

## 2023-10-29 DIAGNOSIS — E119 Type 2 diabetes mellitus without complications: Secondary | ICD-10-CM | POA: Diagnosis not present

## 2023-11-04 DIAGNOSIS — E119 Type 2 diabetes mellitus without complications: Secondary | ICD-10-CM | POA: Diagnosis not present

## 2023-11-06 DIAGNOSIS — I129 Hypertensive chronic kidney disease with stage 1 through stage 4 chronic kidney disease, or unspecified chronic kidney disease: Secondary | ICD-10-CM | POA: Diagnosis not present

## 2023-11-06 DIAGNOSIS — N184 Chronic kidney disease, stage 4 (severe): Secondary | ICD-10-CM | POA: Diagnosis not present

## 2023-11-06 DIAGNOSIS — D631 Anemia in chronic kidney disease: Secondary | ICD-10-CM | POA: Diagnosis not present

## 2023-11-06 DIAGNOSIS — N2581 Secondary hyperparathyroidism of renal origin: Secondary | ICD-10-CM | POA: Diagnosis not present

## 2023-11-20 DIAGNOSIS — H40013 Open angle with borderline findings, low risk, bilateral: Secondary | ICD-10-CM | POA: Diagnosis not present

## 2023-11-20 DIAGNOSIS — E119 Type 2 diabetes mellitus without complications: Secondary | ICD-10-CM | POA: Diagnosis not present

## 2023-11-20 DIAGNOSIS — H2511 Age-related nuclear cataract, right eye: Secondary | ICD-10-CM | POA: Diagnosis not present

## 2023-11-20 DIAGNOSIS — Z961 Presence of intraocular lens: Secondary | ICD-10-CM | POA: Diagnosis not present

## 2023-11-26 DIAGNOSIS — H40013 Open angle with borderline findings, low risk, bilateral: Secondary | ICD-10-CM | POA: Diagnosis not present

## 2023-11-26 DIAGNOSIS — E119 Type 2 diabetes mellitus without complications: Secondary | ICD-10-CM | POA: Diagnosis not present

## 2023-11-26 DIAGNOSIS — H2511 Age-related nuclear cataract, right eye: Secondary | ICD-10-CM | POA: Diagnosis not present

## 2023-11-26 DIAGNOSIS — H353221 Exudative age-related macular degeneration, left eye, with active choroidal neovascularization: Secondary | ICD-10-CM | POA: Diagnosis not present

## 2023-11-26 DIAGNOSIS — H02831 Dermatochalasis of right upper eyelid: Secondary | ICD-10-CM | POA: Diagnosis not present

## 2023-12-05 DIAGNOSIS — H353221 Exudative age-related macular degeneration, left eye, with active choroidal neovascularization: Secondary | ICD-10-CM | POA: Diagnosis not present

## 2023-12-11 DIAGNOSIS — R9721 Rising PSA following treatment for malignant neoplasm of prostate: Secondary | ICD-10-CM | POA: Diagnosis not present

## 2023-12-11 DIAGNOSIS — E1122 Type 2 diabetes mellitus with diabetic chronic kidney disease: Secondary | ICD-10-CM | POA: Diagnosis not present

## 2023-12-11 DIAGNOSIS — E119 Type 2 diabetes mellitus without complications: Secondary | ICD-10-CM | POA: Diagnosis not present

## 2023-12-19 ENCOUNTER — Other Ambulatory Visit: Payer: Self-pay

## 2023-12-19 ENCOUNTER — Encounter (HOSPITAL_COMMUNITY): Payer: Self-pay

## 2023-12-19 ENCOUNTER — Emergency Department (HOSPITAL_COMMUNITY)
Admission: EM | Admit: 2023-12-19 | Discharge: 2023-12-22 | Disposition: A | Discharge status: Home / Self Care | Payer: Medicare Other | Attending: Emergency Medicine | Admitting: Emergency Medicine

## 2023-12-19 ENCOUNTER — Emergency Department (HOSPITAL_COMMUNITY): Payer: Medicare Other

## 2023-12-19 DIAGNOSIS — R000 Tachycardia, unspecified: Secondary | ICD-10-CM | POA: Diagnosis not present

## 2023-12-19 DIAGNOSIS — Z8546 Personal history of malignant neoplasm of prostate: Secondary | ICD-10-CM | POA: Diagnosis not present

## 2023-12-19 DIAGNOSIS — Z7984 Long term (current) use of oral hypoglycemic drugs: Secondary | ICD-10-CM | POA: Diagnosis not present

## 2023-12-19 DIAGNOSIS — C61 Malignant neoplasm of prostate: Secondary | ICD-10-CM | POA: Diagnosis not present

## 2023-12-19 DIAGNOSIS — C7951 Secondary malignant neoplasm of bone: Secondary | ICD-10-CM | POA: Diagnosis not present

## 2023-12-19 DIAGNOSIS — E119 Type 2 diabetes mellitus without complications: Secondary | ICD-10-CM | POA: Insufficient documentation

## 2023-12-19 DIAGNOSIS — W19XXXA Unspecified fall, initial encounter: Secondary | ICD-10-CM | POA: Insufficient documentation

## 2023-12-19 DIAGNOSIS — Z043 Encounter for examination and observation following other accident: Secondary | ICD-10-CM | POA: Diagnosis not present

## 2023-12-19 DIAGNOSIS — S42002D Fracture of unspecified part of left clavicle, subsequent encounter for fracture with routine healing: Secondary | ICD-10-CM | POA: Insufficient documentation

## 2023-12-19 DIAGNOSIS — S32019A Unspecified fracture of first lumbar vertebra, initial encounter for closed fracture: Secondary | ICD-10-CM | POA: Insufficient documentation

## 2023-12-19 DIAGNOSIS — I700 Atherosclerosis of aorta: Secondary | ICD-10-CM | POA: Insufficient documentation

## 2023-12-19 DIAGNOSIS — Z7901 Long term (current) use of anticoagulants: Secondary | ICD-10-CM | POA: Insufficient documentation

## 2023-12-19 DIAGNOSIS — R531 Weakness: Secondary | ICD-10-CM | POA: Diagnosis not present

## 2023-12-19 DIAGNOSIS — J439 Emphysema, unspecified: Secondary | ICD-10-CM | POA: Insufficient documentation

## 2023-12-19 DIAGNOSIS — S3992XA Unspecified injury of lower back, initial encounter: Secondary | ICD-10-CM | POA: Diagnosis not present

## 2023-12-19 DIAGNOSIS — I2510 Atherosclerotic heart disease of native coronary artery without angina pectoris: Secondary | ICD-10-CM | POA: Diagnosis not present

## 2023-12-19 DIAGNOSIS — R9082 White matter disease, unspecified: Secondary | ICD-10-CM | POA: Diagnosis not present

## 2023-12-19 LAB — CBC WITH DIFFERENTIAL/PLATELET
Abs Immature Granulocytes: 0.11 10*3/uL — ABNORMAL HIGH (ref 0.00–0.07)
Basophils Absolute: 0 10*3/uL (ref 0.0–0.1)
Basophils Relative: 0 %
Eosinophils Absolute: 0 10*3/uL (ref 0.0–0.5)
Eosinophils Relative: 0 %
HCT: 32 % — ABNORMAL LOW (ref 39.0–52.0)
Hemoglobin: 10.6 g/dL — ABNORMAL LOW (ref 13.0–17.0)
Immature Granulocytes: 1 %
Lymphocytes Relative: 11 %
Lymphs Abs: 1.3 10*3/uL (ref 0.7–4.0)
MCH: 27 pg (ref 26.0–34.0)
MCHC: 33.1 g/dL (ref 30.0–36.0)
MCV: 81.6 fL (ref 80.0–100.0)
Monocytes Absolute: 0.5 10*3/uL (ref 0.1–1.0)
Monocytes Relative: 5 %
Neutro Abs: 9.8 10*3/uL — ABNORMAL HIGH (ref 1.7–7.7)
Neutrophils Relative %: 83 %
Platelets: 439 10*3/uL — ABNORMAL HIGH (ref 150–400)
RBC: 3.92 MIL/uL — ABNORMAL LOW (ref 4.22–5.81)
RDW: 15 % (ref 11.5–15.5)
WBC: 11.7 10*3/uL — ABNORMAL HIGH (ref 4.0–10.5)
nRBC: 0 % (ref 0.0–0.2)

## 2023-12-19 LAB — BASIC METABOLIC PANEL
Anion gap: 17 — ABNORMAL HIGH (ref 5–15)
BUN: 65 mg/dL — ABNORMAL HIGH (ref 8–23)
CO2: 21 mmol/L — ABNORMAL LOW (ref 22–32)
Calcium: 8 mg/dL — ABNORMAL LOW (ref 8.9–10.3)
Chloride: 98 mmol/L (ref 98–111)
Creatinine, Ser: 2.32 mg/dL — ABNORMAL HIGH (ref 0.61–1.24)
GFR, Estimated: 26 mL/min — ABNORMAL LOW (ref >60)
Glucose, Bld: 291 mg/dL — ABNORMAL HIGH (ref 70–99)
Potassium: 2.8 mmol/L — ABNORMAL LOW (ref 3.5–5.1)
Sodium: 136 mmol/L (ref 135–145)

## 2023-12-19 LAB — CBG MONITORING, ED
Glucose-Capillary: 279 mg/dL — ABNORMAL HIGH (ref 70–99)
Glucose-Capillary: 437 mg/dL — ABNORMAL HIGH (ref 70–99)

## 2023-12-19 LAB — TROPONIN I (HIGH SENSITIVITY)
Troponin I (High Sensitivity): 70 ng/L — ABNORMAL HIGH (ref <18)
Troponin I (High Sensitivity): 56 ng/L — ABNORMAL HIGH (ref <18)

## 2023-12-19 MED ORDER — HYDRALAZINE HCL 10 MG PO TABS
20.0000 mg | ORAL_TABLET | Freq: Two times a day (BID) | ORAL | Status: DC
Start: 2023-12-19 — End: 2023-12-22
  Administered 2023-12-19 – 2023-12-22 (×6): 20 mg via ORAL
  Filled 2023-12-19 (×6): qty 2

## 2023-12-19 MED ORDER — APIXABAN 2.5 MG PO TABS
2.5000 mg | ORAL_TABLET | Freq: Two times a day (BID) | ORAL | Status: DC
  Administered 2023-12-19 – 2023-12-22 (×5): 2.5 mg via ORAL
  Filled 2023-12-19 (×5): qty 1

## 2023-12-19 MED ORDER — POTASSIUM CHLORIDE CRYS ER 20 MEQ PO TBCR
20.0000 meq | EXTENDED_RELEASE_TABLET | Freq: Every day | ORAL | Status: DC
  Administered 2023-12-20: 20 meq via ORAL
  Filled 2023-12-19: qty 1

## 2023-12-19 MED ORDER — POTASSIUM CHLORIDE CRYS ER 20 MEQ PO TBCR: 20.0000 meq | EXTENDED_RELEASE_TABLET | Freq: Every morning | ORAL | Status: DC

## 2023-12-19 MED ORDER — HYDROCHLOROTHIAZIDE 12.5 MG PO TABS
25.0000 mg | ORAL_TABLET | Freq: Every morning | ORAL | Status: DC
  Administered 2023-12-20 – 2023-12-22 (×3): 25 mg via ORAL
  Filled 2023-12-19 (×3): qty 2

## 2023-12-19 MED ORDER — OXYCODONE HCL 5 MG PO TABS
5.0000 mg | ORAL_TABLET | Freq: Once | ORAL | Status: AC
  Administered 2023-12-19: 5 mg via ORAL
  Filled 2023-12-19: qty 1

## 2023-12-19 MED ORDER — METOPROLOL SUCCINATE ER 25 MG PO TB24
12.5000 mg | ORAL_TABLET | Freq: Every day | ORAL | Status: DC
  Administered 2023-12-20 – 2023-12-22 (×3): 12.5 mg via ORAL
  Filled 2023-12-19 (×3): qty 1

## 2023-12-19 MED ORDER — METOPROLOL SUCCINATE ER 25 MG PO TB24
12.5000 mg | ORAL_TABLET | Freq: Every day | ORAL | Status: DC
  Administered 2023-12-19: 12.5 mg via ORAL
  Filled 2023-12-19: qty 1

## 2023-12-19 MED ORDER — OXYCODONE-ACETAMINOPHEN 5-325 MG PO TABS
1.0000 | ORAL_TABLET | Freq: Four times a day (QID) | ORAL | Status: DC | PRN
  Administered 2023-12-21 – 2023-12-22 (×3): 1 via ORAL
  Filled 2023-12-19 (×3): qty 1

## 2023-12-19 MED ORDER — GLIMEPIRIDE 4 MG PO TABS
4.0000 mg | ORAL_TABLET | Freq: Every day | ORAL | Status: DC
  Administered 2023-12-20 – 2023-12-21 (×2): 4 mg via ORAL
  Filled 2023-12-19 (×4): qty 1

## 2023-12-19 MED ORDER — POTASSIUM CHLORIDE 20 MEQ PO PACK
40.0000 meq | PACK | Freq: Once | ORAL | Status: AC
  Administered 2023-12-19: 40 meq via ORAL
  Filled 2023-12-19: qty 2

## 2023-12-19 MED ORDER — FERROUS SULFATE 325 (65 FE) MG PO TABS
325.0000 mg | ORAL_TABLET | Freq: Every morning | ORAL | Status: DC
  Administered 2023-12-20 – 2023-12-22 (×3): 325 mg via ORAL
  Filled 2023-12-19 (×3): qty 1

## 2023-12-19 MED ORDER — HYDROCHLOROTHIAZIDE 12.5 MG PO TABS
25.0000 mg | ORAL_TABLET | Freq: Every morning | ORAL | Status: DC
  Administered 2023-12-19: 25 mg via ORAL
  Filled 2023-12-19: qty 2

## 2023-12-19 NOTE — ED Notes (Addendum)
Pt provided with a meal tray.

## 2023-12-19 NOTE — ED Notes (Addendum)
Pt educated about scan results and order for left arm sling but Pt refuses to allow staff to place sling and states that his arm feels just fine.

## 2023-12-19 NOTE — ED Triage Notes (Addendum)
Patient BIB [facility] from home. Fell yesterday after losing balance. Complaining of back pain. No LOC. Takes eloquis. Does not remember fall.

## 2023-12-19 NOTE — Progress Notes (Signed)
Awaiting PT.  

## 2023-12-19 NOTE — Progress Notes (Signed)
Orthopedic Tech Progress Note Patient Details:  Carl Strong 06-16-1929 [id number]  Patient ID: Carl Strong, male   DOB: 1929-06-21, 89 y.o.   MRN: [id number]  Carl Strong 12/19/2023, 2:40 PM Patient declined sling.

## 2023-12-19 NOTE — ED Provider Notes (Signed)
[place] EMERGENCY DEPARTMENT AT [facility] Provider Note   CSN: [id number] Arrival date & time: 12/19/23  1050     History  Chief Complaint  Patient presents with   Carl Strong is a 89 y.o. male.  With a history of atrial fibrillation on Eliquis, type 2 diabetes and metastatic prostate cancer presents to the ED after a fall.  Patient has had multiple falls due to generalized weakness at home recently.  Last fall was yesterday onto hard surface.  Landed backwards.  No head strike or LOC.  Now with persistent back pain made worse with ambulation.  Pain localized over midline lumbar region.  No pain in extremities headaches or neck pain.   Fall       Home Medications Prior to Admission medications   Medication Sig Start Date End Date Taking? Authorizing Provider  ELIQUIS 2.5 MG TABS tablet Take 2.5 mg by mouth 2 (two) times daily. 09/18/23   [provider]  Ferrous Sulfate Dried (SLOW RELEASE IRON) 45 MG TBCR Take 45 mg by mouth in the morning.    [provider]  glimepiride (AMARYL) 4 MG tablet Take 1 tablet (4 mg total) by mouth daily with breakfast. 06/05/22   Lonia Blood, MD  glucose blood (ONETOUCH ULTRA) test strip USE 1 STRIP TO CHECK GLUCOSE ONCE DAILY Dx: E11.42    [provider]  hydrALAZINE (APRESOLINE) 10 MG tablet Take 20 mg by mouth in the morning and at bedtime. 06/17/23   [provider]  hydrochlorothiazide (HYDRODIURIL) 25 MG tablet Take 25 mg by mouth every morning. 06/11/23   [provider]  metoprolol succinate (TOPROL-XL) 25 MG 24 hr tablet Take 0.5 tablets (12.5 mg total) by mouth daily. 06/06/22   Lonia Blood, MD  potassium chloride SA (KLOR-CON M) 20 MEQ tablet Take 20 mEq by mouth in the morning. 07/11/23   [provider]      Allergies    Crestor [rosuvastatin], Ezetimibe, Jardiance [empagliflozin], and Lipitor [atorvastatin]    Review of Systems   Review of  Systems  Physical Exam Updated Vital Signs BP (!) 186/53   Pulse 73   Temp 98.6 F (37 C) (Oral)   Resp 18   Ht 5\' 11"  (1.803 m)   Wt 74.8 kg   SpO2 100%   BMI 23.01 kg/m  Physical Exam Vitals and nursing note reviewed.  HENT:     Head: Normocephalic and atraumatic.  Eyes:     Pupils: Pupils are equal, round, and reactive to light.  Cardiovascular:     Rate and Rhythm: Normal rate and regular rhythm.  Pulmonary:     Effort: Pulmonary effort is normal.     Breath sounds: Normal breath sounds.  Abdominal:     Palpations: Abdomen is soft.     Tenderness: There is no abdominal tenderness.  Musculoskeletal:     Cervical back: Neck supple. No tenderness.     Comments: Midline lumbar tenderness No thoracic midline tenderness No step-off deformities  Skin:    General: Skin is warm and dry.  Neurological:     Mental Status: He is alert and oriented to person, place, and time.     Sensory: No sensory deficit.     Motor: No weakness.  Psychiatric:        Mood and Affect: Mood normal.     ED Results / Procedures / Treatments   Labs (all labs ordered are listed, but  only abnormal results are displayed) Labs Reviewed  BASIC METABOLIC PANEL - Abnormal; Notable for the following components:      Result Value   Potassium 2.8 (*)    CO2 21 (*)    Glucose, Bld 291 (*)    BUN 65 (*)    Creatinine, Ser 2.32 (*)    Calcium 8.0 (*)    GFR, Estimated 26 (*)    Anion gap 17 (*)    All other components within normal limits  CBC WITH DIFFERENTIAL/PLATELET - Abnormal; Notable for the following components:   WBC 11.7 (*)    RBC 3.92 (*)    Hemoglobin 10.6 (*)    HCT 32.0 (*)    Platelets 439 (*)    Neutro Abs 9.8 (*)    Abs Immature Granulocytes 0.11 (*)    All other components within normal limits  CBG MONITORING, ED - Abnormal; Notable for the following components:   Glucose-Capillary 279 (*)    All other components within normal limits  TROPONIN I (HIGH SENSITIVITY) -  Abnormal; Notable for the following components:   Troponin I (High Sensitivity) 70 (*)    All other components within normal limits  TROPONIN I (HIGH SENSITIVITY)    EKG EKG Interpretation Date/Time:  Thursday December 19 2023 11:21:32 EST Ventricular Rate:  105 PR Interval:  117 QRS Duration:  86 QT Interval:  360 QTC Calculation: 476 R Axis:   84  Text Interpretation: Sinus tachycardia with irregular rate Borderline right axis deviation LVH with secondary repolarization abnormality ST depr, consider ischemia, inferior leads Borderline prolonged QT interval Artifact in lead(s) I II III aVR aVL aVF V4 V5 V6 Confirmed by Estelle June [phone]) on 12/19/2023 2:50:08 PM  Radiology CT HEAD WO CONTRAST Result Date: 12/19/2023 CLINICAL DATA:  Fall, Eliquis EXAM: CT HEAD WITHOUT CONTRAST CT CERVICAL SPINE WITHOUT CONTRAST TECHNIQUE: Multidetector CT imaging of the head and cervical spine was performed following the standard protocol without intravenous contrast. Multiplanar CT image reconstructions of the cervical spine were also generated. RADIATION DOSE REDUCTION: This exam was performed according to the departmental dose-optimization program which includes automated exposure control, adjustment of the mA and/or kV according to patient size and/or use of iterative reconstruction technique. COMPARISON:  None Available. FINDINGS: CT HEAD FINDINGS Brain: No evidence of acute infarction, hemorrhage, hydrocephalus, extra-axial collection or mass lesion/mass effect. Periventricular and deep white matter hypodensity. Vascular: No hyperdense vessel or unexpected calcification. Skull: Normal. Negative for fracture or focal lesion. Sinuses/Orbits: No acute finding. Large frontal sialoliths (series 4, image 43). Other: None. CT CERVICAL SPINE FINDINGS Alignment: Normal. Skull base and vertebrae: No acute fracture. Sclerotic osseous metastases of C1 and C6 (series 10, image 22, 27). Soft tissues and spinal  canal: No prevertebral fluid or swelling. No visible canal hematoma. Disc levels: Mild to moderate multilevel disc degenerative disease and osteophytosis, worst at C6-C7. Upper chest: Negative. Other: None. IMPRESSION: 1. No acute intracranial pathology. Small-vessel white matter disease in keeping with advanced patient age. 2. No fracture or static subluxation of the cervical spine. 3. Sclerotic osseous metastases of C1 and C6. 4. Mild to moderate multilevel cervical disc degenerative disease and osteophytosis. Electronically Signed   By: Jearld Lesch M.D.   On: 12/19/2023 12:35   CT CERVICAL SPINE WO CONTRAST Result Date: 12/19/2023 CLINICAL DATA:  Fall, Eliquis EXAM: CT HEAD WITHOUT CONTRAST CT CERVICAL SPINE WITHOUT CONTRAST TECHNIQUE: Multidetector CT imaging of the head and cervical spine was performed following the standard protocol without intravenous  contrast. Multiplanar CT image reconstructions of the cervical spine were also generated. RADIATION DOSE REDUCTION: This exam was performed according to the departmental dose-optimization program which includes automated exposure control, adjustment of the mA and/or kV according to patient size and/or use of iterative reconstruction technique. COMPARISON:  None Available. FINDINGS: CT HEAD FINDINGS Brain: No evidence of acute infarction, hemorrhage, hydrocephalus, extra-axial collection or mass lesion/mass effect. Periventricular and deep white matter hypodensity. Vascular: No hyperdense vessel or unexpected calcification. Skull: Normal. Negative for fracture or focal lesion. Sinuses/Orbits: No acute finding. Large frontal sialoliths (series 4, image 43). Other: None. CT CERVICAL SPINE FINDINGS Alignment: Normal. Skull base and vertebrae: No acute fracture. Sclerotic osseous metastases of C1 and C6 (series 10, image 22, 27). Soft tissues and spinal canal: No prevertebral fluid or swelling. No visible canal hematoma. Disc levels: Mild to moderate multilevel  disc degenerative disease and osteophytosis, worst at C6-C7. Upper chest: Negative. Other: None. IMPRESSION: 1. No acute intracranial pathology. Small-vessel white matter disease in keeping with advanced patient age. 2. No fracture or static subluxation of the cervical spine. 3. Sclerotic osseous metastases of C1 and C6. 4. Mild to moderate multilevel cervical disc degenerative disease and osteophytosis. Electronically Signed   By: Jearld Lesch M.D.   On: 12/19/2023 12:35   CT Lumbar Spine Wo Contrast Result Date: 12/19/2023 CLINICAL DATA:  Fall yesterday, on Eliquis, prostate cancer with recurrent status post prostatectomy * Tracking Code: BO * EXAM: CT CHEST, ABDOMEN AND PELVIS WITHOUT CONTRAST CT LUMBAR SPINE WITHOUT CONTRAST TECHNIQUE: Multidetector CT imaging of the chest, abdomen and pelvis was performed following the standard protocol without IV contrast. Multidetector CT imaging of the lumbar spine was performed following the standard protocol without IV contrast. RADIATION DOSE REDUCTION: This exam was performed according to the departmental dose-optimization program which includes automated exposure control, adjustment of the mA and/or kV according to patient size and/or use of iterative reconstruction technique. COMPARISON:  PET-CT, 08/21/2023 FINDINGS: CT CHEST FINDINGS Cardiovascular: Aortic atherosclerosis. Normal heart size. Three-vessel coronary artery calcifications no pericardial effusion. Mediastinum/Nodes: No enlarged mediastinal, hilar, or axillary lymph nodes. Small hiatal hernia. Thyroid gland, trachea, and esophagus demonstrate no significant findings. Lungs/Pleura: Moderate emphysema. Diffuse bilateral bronchial wall thickening. No pleural effusion or pneumothorax. Musculoskeletal: No chest wall abnormality. Mildly displaced, comminuted fracture of the left clavicular head (series 6, image 10). CT ABDOMEN PELVIS FINDINGS Hepatobiliary: No focal liver abnormality is seen. Status post  cholecystectomy. No biliary dilatation. Pancreas: Unremarkable. No pancreatic ductal dilatation or surrounding inflammatory changes. Spleen: Normal in size without significant abnormality. Adrenals/Urinary Tract: Adrenal glands are unremarkable. Left-sided double-J ureteral stent catheter with formed pigtails in the left renal pelvis and bladder, mild persistent hydronephrosis and hydroureter. Bladder is unremarkable. Stomach/Bowel: Stomach is within normal limits. Appendix appears normal. No evidence of bowel wall thickening, distention, or inflammatory changes. Sigmoid diverticulosis. Vascular/Lymphatic: Aortic atherosclerosis. Slightly diminished size of numerous enlarged retroperitoneal and left pelvic sidewall lymph nodes, index left retroperitoneal node measuring 1.8 x 1.1 cm, previously 2.3 x 1.6 cm (series 6, image 73). Reproductive: Status post prostatectomy. Other: Small fat containing bilateral inguinal hernias.  No ascites. Musculoskeletal: No acute osseous findings. New and increased sclerosis of multiple osseous metastases, including of the manubrium, right humerus, the T3 vertebral body, the T11 vertebral body, and the left ilium. CT LUMBAR SPINE FINDINGS Alignment: Normal lumbar lordosis. Vertebral bodies: Acute superior endplate fracture of L1 with minimal, no greater than 10% superior height loss (series 16, image 95). Unchanged superior endplate  wedge deformity of L3 with 50% height loss. Disc spaces: Intact. Paraspinous soft tissues: Unremarkable. IMPRESSION: 1. Mildly displaced, comminuted fractures of the left clavicular head. 2. Acute superior endplate fracture of L1 with minimal, no greater than 10% superior height loss. Unchanged superior endplate wedge deformity of L3 with 50% height loss. 3. Slightly diminished size of numerous enlarged retroperitoneal and left pelvic sidewall lymph nodes, consistent with treatment response of nodal metastatic disease. 4. New and increased sclerosis of  multiple osseous metastases, consistent with traumatic treatment response of osseous metastatic disease. 5. Left-sided double-J ureteral stent catheter with formed pigtails in the left renal pelvis and bladder, mild persistent hydronephrosis and hydroureter. 6. Emphysema and diffuse bilateral bronchial wall thickening. 7. Coronary artery disease. Aortic Atherosclerosis (ICD10-I70.0) and Emphysema (ICD10-J43.9). Electronically Signed   By: Jearld Lesch M.D.   On: 12/19/2023 12:28   CT CHEST ABDOMEN PELVIS WO CONTRAST Result Date: 12/19/2023 CLINICAL DATA:  Fall yesterday, on Eliquis, prostate cancer with recurrent status post prostatectomy * Tracking Code: BO * EXAM: CT CHEST, ABDOMEN AND PELVIS WITHOUT CONTRAST CT LUMBAR SPINE WITHOUT CONTRAST TECHNIQUE: Multidetector CT imaging of the chest, abdomen and pelvis was performed following the standard protocol without IV contrast. Multidetector CT imaging of the lumbar spine was performed following the standard protocol without IV contrast. RADIATION DOSE REDUCTION: This exam was performed according to the departmental dose-optimization program which includes automated exposure control, adjustment of the mA and/or kV according to patient size and/or use of iterative reconstruction technique. COMPARISON:  PET-CT, 08/21/2023 FINDINGS: CT CHEST FINDINGS Cardiovascular: Aortic atherosclerosis. Normal heart size. Three-vessel coronary artery calcifications no pericardial effusion. Mediastinum/Nodes: No enlarged mediastinal, hilar, or axillary lymph nodes. Small hiatal hernia. Thyroid gland, trachea, and esophagus demonstrate no significant findings. Lungs/Pleura: Moderate emphysema. Diffuse bilateral bronchial wall thickening. No pleural effusion or pneumothorax. Musculoskeletal: No chest wall abnormality. Mildly displaced, comminuted fracture of the left clavicular head (series 6, image 10). CT ABDOMEN PELVIS FINDINGS Hepatobiliary: No focal liver abnormality is seen.  Status post cholecystectomy. No biliary dilatation. Pancreas: Unremarkable. No pancreatic ductal dilatation or surrounding inflammatory changes. Spleen: Normal in size without significant abnormality. Adrenals/Urinary Tract: Adrenal glands are unremarkable. Left-sided double-J ureteral stent catheter with formed pigtails in the left renal pelvis and bladder, mild persistent hydronephrosis and hydroureter. Bladder is unremarkable. Stomach/Bowel: Stomach is within normal limits. Appendix appears normal. No evidence of bowel wall thickening, distention, or inflammatory changes. Sigmoid diverticulosis. Vascular/Lymphatic: Aortic atherosclerosis. Slightly diminished size of numerous enlarged retroperitoneal and left pelvic sidewall lymph nodes, index left retroperitoneal node measuring 1.8 x 1.1 cm, previously 2.3 x 1.6 cm (series 6, image 73). Reproductive: Status post prostatectomy. Other: Small fat containing bilateral inguinal hernias.  No ascites. Musculoskeletal: No acute osseous findings. New and increased sclerosis of multiple osseous metastases, including of the manubrium, right humerus, the T3 vertebral body, the T11 vertebral body, and the left ilium. CT LUMBAR SPINE FINDINGS Alignment: Normal lumbar lordosis. Vertebral bodies: Acute superior endplate fracture of L1 with minimal, no greater than 10% superior height loss (series 16, image 95). Unchanged superior endplate wedge deformity of L3 with 50% height loss. Disc spaces: Intact. Paraspinous soft tissues: Unremarkable. IMPRESSION: 1. Mildly displaced, comminuted fractures of the left clavicular head. 2. Acute superior endplate fracture of L1 with minimal, no greater than 10% superior height loss. Unchanged superior endplate wedge deformity of L3 with 50% height loss. 3. Slightly diminished size of numerous enlarged retroperitoneal and left pelvic sidewall lymph nodes, consistent with treatment response of  nodal metastatic disease. 4. New and increased  sclerosis of multiple osseous metastases, consistent with traumatic treatment response of osseous metastatic disease. 5. Left-sided double-J ureteral stent catheter with formed pigtails in the left renal pelvis and bladder, mild persistent hydronephrosis and hydroureter. 6. Emphysema and diffuse bilateral bronchial wall thickening. 7. Coronary artery disease. Aortic Atherosclerosis (ICD10-I70.0) and Emphysema (ICD10-J43.9). Electronically Signed   By: Jearld Lesch M.D.   On: 12/19/2023 12:28    Procedures Procedures    Medications Ordered in ED Medications  oxyCODONE (Oxy IR/ROXICODONE) immediate release tablet 5 mg (5 mg Oral Given 12/19/23 1332)  potassium chloride (KLOR-CON) packet 40 mEq (40 mEq Oral Given 12/19/23 1421)    ED Course/ Medical Decision Making/ A&P Clinical Course as of 12/19/23 1450  Thu Dec 19, 2023  1336 CT head C-spine imaging reveals no acute intracranial pathology.  No fracture or subluxation of the C-spine.  Sclerotic osseous mets C1 C6 and mild to moderate degenerative disc disease  CT chest abdomen pelvis lumbar shows acute injuries of mildly displaced comminuted fracture of the left clavicular head, acute superior endplate fracture of L1 new and increased sclerosis of multiple osseous mets and other chronic findings  Superior endplate L1 fracture most likely cause of patient's acute lower back pain.  We have provided p.o. analgesia.  Will reach out to orthopedics to discuss management of left clavicle fracture [MP]  1446 Discussed with orthopedics who recommends left shoulder sling with follow-up in the office in 2 to 3 weeks.  Discussed this with the patient however he is not having pain in this area and does not want to wear the shoulder sling.  Also informed him and his daughter of endplate L1 compression fracture.  Besides pain control nothing to do for this injury at this time.  Discussed patient's increased weakness with multiple recent falls at home.  Both  he and his daughter feel as though he is unsafe to return home in this state.  We will keep him in the ED awaiting TOC and PT consultation hopefully for rehab/skilled nursing facility placement [MP]  1449 Hypokalemia of 2.8.  Will provide oral repletion.  Initial high-sensitivity opponent of 70 close to baseline but will obtain delta.  No active chest pain.  No ischemic changes on EKG. [MP]    Clinical Course User Index [MP] Royanne Foots, DO                                 Medical Decision Making 89 year old male with history as above including atrial fibrillation on Eliquis and metastatic prostate cancer presenting after fall yesterday.  Subsequent lower back pain.  No head trauma or LOC.  Given that he is on Eliquis with significant back pain and known metastatic prostate cancer with mets to bone concerning for traumatic fracture.  Will obtain CT head C-spine T-spine L-spine to evaluate for traumatic injury.  History of renal impairment.  Noncontrast needed.  Motor or sensory intact.  Low suspicion for spinal cord involvement  Amount and/or Complexity of Data Reviewed Radiology: ordered.  Risk Prescription drug management.           Final Clinical Impression(s) / ED Diagnoses Final diagnoses:  Closed fracture of first lumbar vertebra, unspecified fracture morphology, initial encounter (HCC)  Closed nondisplaced fracture of left clavicle with routine healing, unspecified part of clavicle, subsequent encounter  Fall, initial encounter  Prostate cancer [facility])    Rx /  DC Orders ED Discharge Orders     None         Royanne Foots, DO 12/19/23 1450

## 2023-12-19 NOTE — [place] FL2 (Signed)
MEDICAID FL2 LEVEL OF CARE FORM     IDENTIFICATION  Patient Name: Carl Strong Birthdate: April 10, 1929 Sex: male Admission Date (Current Location): 12/19/2023  [facility] and [place] Number:  Producer, television/film/video and Address:  [facility],  [address] [place], [place] [address]      Provider Number: [id number]  Attending Physician Name and Address:  System, Provider Not In  Relative Name and Phone Number:       Current Level of Care: Hospital Recommended Level of Care: Skilled Nursing Facility Prior Approval Number:    Date Approved/Denied:   PASRR Number: [id number]  Discharge Plan: SNF    Current Diagnoses: Patient Active Problem List   Diagnosis Date Noted   Malnutrition of moderate degree 06/05/2022   Weakness    Dizziness 06/03/2022   Hypokalemia 06/03/2022   AKI (acute kidney injury) (HCC) 06/03/2022   DNR (do not resuscitate) 06/03/2022   Hyperlipidemia 05/23/2022   Paroxysmal atrial fibrillation (HCC) 05/23/2022   Acute encephalopathy 01/06/2017   Hypoglycemia 01/06/2017   Hypertension    GERD (gastroesophageal reflux disease)    Diabetes mellitus without complication (HCC)     Orientation RESPIRATION BLADDER Height & Weight     Time, Self, Situation, Place  Normal Continent Weight: 165 lb (74.8 kg) Height:  5\' 11"  (180.3 cm)  BEHAVIORAL SYMPTOMS/MOOD NEUROLOGICAL BOWEL NUTRITION STATUS      Continent Diet (Heart healthy)  AMBULATORY STATUS COMMUNICATION OF NEEDS Skin   Limited Assist Verbally Normal                       Personal Care Assistance Level of Assistance  Bathing, Feeding, Dressing Bathing Assistance: Limited assistance Feeding assistance: Independent Dressing Assistance: Limited assistance     Functional Limitations Info  Sight, Hearing, Speech Sight Info: Adequate Hearing Info: Adequate Speech Info: Adequate    SPECIAL CARE FACTORS FREQUENCY                       Contractures Contractures  Info: Not present    Additional Factors Info  Code Status, Allergies Code Status Info: Full Allergies Info: Crestor (rosuvastatin), Ezetimibe, Jardiance (empagliflozin), lipitor (atorvastatin), Metformin And Related           Current Medications (12/19/2023):  This is the current hospital active medication list Current Facility-Administered Medications  Medication Dose Route Frequency Provider Last Rate Last Admin   apixaban (ELIQUIS) tablet 2.5 mg  2.5 mg Oral BID Estelle June A, DO   2.5 mg at 12/19/23 2152   [START ON 12/20/2023] ferrous sulfate tablet 325 mg  325 mg Oral q AM Royanne Foots, DO       [START ON 12/20/2023] glimepiride (AMARYL) tablet 4 mg  4 mg Oral Q breakfast Estelle June A, DO       hydrALAZINE (APRESOLINE) tablet 20 mg  20 mg Oral BID Estelle June A, DO   20 mg at 12/19/23 2152   [START ON 12/20/2023] hydrochlorothiazide (HYDRODIURIL) tablet 25 mg  25 mg Oral q morning Royanne Foots, DO       [START ON 12/20/2023] metoprolol succinate (TOPROL-XL) 24 hr tablet 12.5 mg  12.5 mg Oral Daily Estelle June A, DO       oxyCODONE-acetaminophen (PERCOCET/ROXICET) 5-325 MG per tablet 1 tablet  1 tablet Oral Q6H PRN Royanne Foots, DO       [START ON 12/20/2023] potassium chloride SA (KLOR-CON M) CR tablet 20 mEq  20 mEq Oral Daily Royanne Foots, DO       Current Outpatient Medications  Medication Sig Dispense Refill   ELIQUIS 2.5 MG TABS tablet Take 2.5 mg by mouth in the morning and at bedtime.     furosemide (LASIX) 20 MG tablet Take 10 mg by mouth every 12 (twelve) hours.     hydrALAZINE (APRESOLINE) 10 MG tablet Take 5 mg by mouth in the morning and at bedtime.     JARDIANCE 10 MG TABS tablet Take 10 mg by mouth daily.     metoprolol succinate (TOPROL-XL) 25 MG 24 hr tablet Take 0.5 tablets (12.5 mg total) by mouth daily. 15 tablet 1   potassium chloride SA (KLOR-CON M) 20 MEQ tablet Take 20 mEq by mouth in the morning.     XTANDI 40 MG tablet Take 80  mg by mouth daily.     glimepiride (AMARYL) 4 MG tablet Take 1 tablet (4 mg total) by mouth daily with breakfast. (Patient not taking: Reported on 12/19/2023) 30 tablet    glucose blood (ONETOUCH ULTRA) test strip USE 1 STRIP TO CHECK GLUCOSE ONCE DAILY Dx: E11.42       Discharge Medications: Please see discharge summary for a list of discharge medications.  Relevant Imaging Results:  Relevant Lab Results:   Additional Information SSN: [id number]  Susa Simmonds, LCSWA

## 2023-12-19 NOTE — TOC Initial Note (Signed)
Transition of Care [facility]) - Initial/Assessment Note    Patient Details  Name: Carl Strong MRN: [id number] Date of Birth: October 27, 1929  Transition of Care [facility]) CM/SW Contact:    Susa Simmonds, LCSWA Phone Number: 12/19/2023, 8:42 PM  Clinical Narrative: CSW spoke with patient who is agreeable to SNF placement. CSW will start SNF bed search                   Expected Discharge Plan: Skilled Nursing Facility Barriers to Discharge: Continued Medical Work up   Patient Goals and CMS Choice Patient states their goals for this hospitalization and ongoing recovery are:: SNF          Expected Discharge Plan and Services                                              Prior Living Arrangements/Services   Lives with:: Self Patient language and need for interpreter reviewed:: Yes              Criminal Activity/Legal Involvement Pertinent to Current Situation/Hospitalization: No - Comment as needed  Activities of Daily Living      Permission Sought/Granted                  Emotional Assessment   Attitude/Demeanor/Rapport: Engaged Affect (typically observed): Calm Orientation: : Oriented to Self, Oriented to Place, Oriented to  Time, Oriented to Situation Alcohol / Substance Use: Not Applicable Psych Involvement: No (comment)  Admission diagnosis:  Fall Patient Active Problem List   Diagnosis Date Noted   Malnutrition of moderate degree 06/05/2022   Weakness    Dizziness 06/03/2022   Hypokalemia 06/03/2022   AKI (acute kidney injury) (HCC) 06/03/2022   DNR (do not resuscitate) 06/03/2022   Hyperlipidemia 05/23/2022   Paroxysmal atrial fibrillation (HCC) 05/23/2022   Acute encephalopathy 01/06/2017   Hypoglycemia 01/06/2017   Hypertension    GERD (gastroesophageal reflux disease)    Diabetes mellitus without complication (HCC)    PCP:  Kaleen Mask, MD Pharmacy:   [facility] [phone] - [place], [place] - [address] AT  [address] [place] [address] Phone: [phone] Fax: [phone]     Social Drivers of Health (SDOH) Social History: SDOH Screenings   Tobacco Use: Medium Risk (12/19/2023)   SDOH Interventions:     Readmission Risk Interventions     No data to display

## 2023-12-19 NOTE — ED Provider Triage Note (Signed)
Emergency Medicine Provider Triage Evaluation Note  Carl Strong , a 89 y.o. male  was evaluated in triage.  Pt complains of fall.  Occurred yesterday.  He is on Eliquis.  Has PAF.  States he may have passed out before he fell.  Now with lower back pain.  Denies chest pain, headache or visual disturbance.  Review of Systems  Positive: See above Negative: See above  Physical Exam  BP (!) 168/71 (BP Location: Right Arm)   Pulse (!) 51   Temp 98.6 F (37 C) (Oral)   Resp 16   Ht 5\' 11"  (1.803 m)   Wt 74.8 kg   SpO2 100%   BMI 23.01 kg/m  Gen:   Awake, no distress   Resp:  Normal effort  MSK:   Moves extremities without difficulty  Other:    Medical Decision Making  Medically screening exam initiated at 11:09 AM.  Appropriate orders placed.  KREIGHTON BEANS was informed that the remainder of the evaluation will be completed by another provider, this initial triage assessment does not replace that evaluation, and the importance of remaining in the ED until their evaluation is complete.  Work up started   Gareth Eagle, PA-C 12/19/23 1110

## 2023-12-19 NOTE — Progress Notes (Signed)
Consult received from the ED provider.  I have reviewed the pertinent radiographs.  Impression is left medial clavicle fracture.  Nonoperative treatment with shoulder sling.  Weightbearing and range of motion as tolerated.  Follow-up in the office in about 3 weeks.

## 2023-12-19 NOTE — Evaluation (Signed)
Physical Therapy Evaluation Patient Details Name: Carl Strong MRN: [id number] DOB: Apr 14, 1929 Today's Date: 12/19/2023  History of Present Illness  89 yo male s/p fall, L clavicle fracture, L1 compression fracture. PMH:  HTN, DM, afib, prostate ca,  Clinical Impression    Pt admitted with above diagnosis.  Pt currently with functional limitations due to the deficits listed below (see PT Problem List). Pt will benefit from acute skilled PT to increase their independence and safety with mobility to allow discharge.   The patient reports  that the pain is  minimal in the left  clavicle and  back.  LUE sling not in place.   Patient  stands  at [facility], able to step to [facility]. CNA in to assist with toileting.  Patient will benefit from continued inpatient follow up therapy, <3 hours/day.       If plan is discharge home, recommend the following: A little help with walking and/or transfers;Assistance with cooking/housework;Assist for transportation;Help with stairs or ramp for entrance   Can travel by private vehicle    yes    Equipment Recommendations Rolling walker (2 wheels)  Recommendations for Other Services    OT   Functional Status Assessment Patient has had a recent decline in their functional status and demonstrates the ability to make significant improvements in function in a reasonable and predictable amount of time.     Precautions / Restrictions Precautions Precautions: Fall Precaution Comments: Left clavicle fx Required Braces or Orthoses: Sling Restrictions Other Position/Activity Restrictions: pt declines to wear sling, so using  UE      Mobility  Bed Mobility               General bed mobility comments: in ED chair/recliner    Transfers Overall transfer level: Needs assistance Equipment used: Rolling walker (2 wheels) Transfers: Sit to/from Stand, Bed to chair/wheelchair/BSC Sit to Stand: Mod assist           General transfer comment: moda ssist  to rise( Ed Chief of Staff in way). Able to step to [facility] with min assistance hold in RW    Ambulation/Gait Ambulation/Gait assistance: Min assist Gait Distance (Feet): 5 Feet Assistive device: Rolling walker (2 wheels) Gait Pattern/deviations: Step-to pattern       General Gait Details: LImited weight on LUE  Stairs            Wheelchair Mobility     Tilt Bed    Modified Rankin (Stroke Patients Only)       Balance Overall balance assessment: History of Falls, Needs assistance Sitting-balance support: Feet supported, No upper extremity supported Sitting balance-Leahy Scale: Fair     Standing balance support: During functional activity, Bilateral upper extremity supported, Reliant on assistive device for balance Standing balance-Leahy Scale: Poor                               Pertinent Vitals/Pain Pain Assessment Pain Assessment: Faces Faces Pain Scale: Hurts a little bit Pain Location: low back Pain Descriptors / Indicators: Discomfort Pain Intervention(s): Premedicated before session    Home Living Family/patient expects to be discharged to:: Private residence Living Arrangements: Alone Available Help at Discharge: Family;Available PRN/intermittently Type of Home: House Home Access: Stairs to enter Entrance Stairs-Rails: Can reach both Entrance Stairs-Number of Steps: 4   Home Layout: One level Home Equipment: Grab bars - toilet      Prior Function Prior Level of Function : Independent/Modified Independent;Driving  Mobility Comments: reorts multiple falls       Extremity/Trunk Assessment   Upper Extremity Assessment Upper Extremity Assessment: LUE deficits/detail LUE Deficits / Details: noted  edema at clivicle level, able to  reach forward,   a small degree    Lower Extremity Assessment Lower Extremity Assessment: Generalized weakness    Cervical / Trunk Assessment Cervical / Trunk Assessment: Kyphotic   Communication   Communication Communication: No apparent difficulties;Hearing impairment  Cognition Arousal: Alert Behavior During Therapy: WFL for tasks assessed/performed Overall Cognitive Status: Impaired/Different from baseline Area of Impairment: Orientation                 Orientation Level: Time                      General Comments      Exercises     Assessment/Plan    PT Assessment Patient needs continued PT services  PT Problem List Decreased strength;Decreased mobility;Decreased safety awareness;Decreased range of motion;Decreased knowledge of precautions;Decreased activity tolerance;Decreased balance;Pain       PT Treatment Interventions DME instruction;Therapeutic activities;Gait training;Therapeutic exercise;Patient/family education;Functional mobility training    PT Goals (Current goals can be found in the Care Plan section)  Acute Rehab PT Goals Patient Stated Goal: go to rehab PT Goal Formulation: With patient Time For Goal Achievement: 01/02/24 Potential to Achieve Goals: Good    Frequency Min 1X/week     Co-evaluation               AM-PAC PT "6 Clicks" Mobility  Outcome Measure Help needed turning from your back to your side while in a flat bed without using bedrails?: A Little Help needed moving from lying on your back to sitting on the side of a flat bed without using bedrails?: A Little Help needed moving to and from a bed to a chair (including a wheelchair)?: A Little Help needed standing up from a chair using your arms (e.g., wheelchair or bedside chair)?: A Little Help needed to walk in hospital room?: A Lot Help needed climbing 3-5 steps with a railing? : Total 6 Click Score: 15    End of Session   Activity Tolerance: Patient tolerated treatment well Patient left: in chair;with nursing/sitter in room;with call bell/phone within reach Nurse Communication: Mobility status PT Visit Diagnosis: Unsteadiness on feet  (R26.81);Difficulty in walking, not elsewhere classified (R26.2);Repeated falls (R29.6)    Time: 1636-1700 PT Time Calculation (min) (ACUTE ONLY): 24 min   Charges:   PT Evaluation $PT Eval Low Complexity: 1 Low PT Treatments $Therapeutic Activity: 8-22 mins PT General Charges $$ ACUTE PT VISIT: 1 Visit         Blanchard Kelch PT Acute Rehabilitation Services Office [phone] Weekend pager-[phone]   Rada Hay 12/19/2023, 5:05 PM

## 2023-12-20 LAB — BASIC METABOLIC PANEL
Anion gap: 15 (ref 5–15)
BUN: 59 mg/dL — ABNORMAL HIGH (ref 8–23)
CO2: 22 mmol/L (ref 22–32)
Calcium: 7.5 mg/dL — ABNORMAL LOW (ref 8.9–10.3)
Chloride: 93 mmol/L — ABNORMAL LOW (ref 98–111)
Creatinine, Ser: 2.04 mg/dL — ABNORMAL HIGH (ref 0.61–1.24)
GFR, Estimated: 30 mL/min — ABNORMAL LOW (ref >60)
Glucose, Bld: 485 mg/dL — ABNORMAL HIGH (ref 70–99)
Potassium: 3 mmol/L — ABNORMAL LOW (ref 3.5–5.1)
Sodium: 130 mmol/L — ABNORMAL LOW (ref 135–145)

## 2023-12-20 LAB — CBG MONITORING, ED
Glucose-Capillary: 383 mg/dL — ABNORMAL HIGH (ref 70–99)
Glucose-Capillary: 495 mg/dL — ABNORMAL HIGH (ref 70–99)
Glucose-Capillary: 200 mg/dL — ABNORMAL HIGH (ref 70–99)

## 2023-12-20 MED ORDER — INSULIN ASPART 100 UNIT/ML IJ SOLN
0.0000 [IU] | Freq: Three times a day (TID) | INTRAMUSCULAR | Status: DC
Start: 2023-12-20 — End: 2023-12-22
  Administered 2023-12-20 – 2023-12-21 (×2): 2 [IU] via SUBCUTANEOUS
  Administered 2023-12-21: 3 [IU] via SUBCUTANEOUS
  Administered 2023-12-21: 7 [IU] via SUBCUTANEOUS
  Administered 2023-12-22: 3 [IU] via SUBCUTANEOUS
  Filled 2023-12-20: qty 0.09

## 2023-12-20 MED ORDER — POTASSIUM CHLORIDE CRYS ER 20 MEQ PO TBCR
20.0000 meq | EXTENDED_RELEASE_TABLET | Freq: Two times a day (BID) | ORAL | Status: DC
  Administered 2023-12-20 – 2023-12-22 (×4): 20 meq via ORAL
  Filled 2023-12-20 (×4): qty 1

## 2023-12-20 MED ORDER — INSULIN ASPART 100 UNIT/ML IJ SOLN
12.0000 [IU] | Freq: Once | INTRAMUSCULAR | Status: AC
  Administered 2023-12-20: 12 [IU] via SUBCUTANEOUS
  Filled 2023-12-20: qty 0.12

## 2023-12-20 NOTE — Progress Notes (Signed)
[facility] offered a bed. Patient aware.   Auth pending for [facility] at [facility].

## 2023-12-20 NOTE — Inpatient Diabetes Management (Signed)
Inpatient Diabetes Program Recommendations  AACE/ADA: New Consensus Statement on Inpatient Glycemic Control (2015)  Target Ranges:  Prepandial:   less than 140 mg/dL      Peak postprandial:   less than 180 mg/dL (1-2 hours)      Critically ill patients:  140 - 180 mg/dL    Latest Reference Range & Units 12/19/23 12:07 12/19/23 23:31  Glucose-Capillary 70 - 99 mg/dL 161 (H) 096 (H)  (H): Data is abnormally high   To ED with Fall and Back Pain  History: DM2  Home DM Meds: Amaryl 4 mg daily  Current Orders: Amaryl 4 mg daily     MD- If pt will be holding in the ED for placement, please add:  Novolog Sensitive Correction Scale/ SSI (0-9 units) TID AC + HS  (Use Glycemic Control Order set)     --Will follow patient during hospitalization--  Ambrose Finland RN, MSN, CDCES Diabetes Coordinator Inpatient Glycemic Control Team Team Pager: [phone] (8a-5p)

## 2023-12-20 NOTE — Progress Notes (Signed)
CSW met with pt at bedside to present bed offers. Pt is interested in hearing back from [facility] at [facility] before making a decision. CSW has outreached to [place] to request review.

## 2023-12-20 NOTE — ED Provider Notes (Signed)
Emergency Medicine Observation Re-evaluation Note  Carl Strong is a 89 y.o. male, seen on rounds today.  Pt initially presented to the ED for complaints of Fall Currently, the patient is resting. Endorses some back pain but seems to be controlled.  Physical Exam  BP (!) 150/49   Pulse 73   Temp 98.6 F (37 C) (Oral)   Resp 18   Ht 5\' 11"  (1.803 m)   Wt 74.8 kg   SpO2 100%   BMI 23.01 kg/m  Physical Exam General: no acute distress Lungs: normal effort Psych: no agitation  ED Course / MDM  EKG:EKG Interpretation Date/Time:  Thursday December 19 2023 11:21:32 EST Ventricular Rate:  105 PR Interval:  117 QRS Duration:  86 QT Interval:  360 QTC Calculation: 476 R Axis:   84  Text Interpretation: Sinus tachycardia with irregular rate Borderline right axis deviation LVH with secondary repolarization abnormality ST depr, consider ischemia, inferior leads Borderline prolonged QT interval Artifact in lead(s) I II III aVR aVL aVF V4 V5 V6 Confirmed by Estelle June [phone]) on 12/19/2023 2:50:08 PM  I have reviewed the labs performed to date as well as medications administered while in observation.  Recent changes in the last 24 hours include home meds and potassium replacement being ordered.  Plan  Current plan is for placement.    Pricilla Loveless, MD 12/20/23 [phone]

## 2023-12-20 NOTE — ED Notes (Addendum)
Pt to restroom and back to bed via wheelchair with tech

## 2023-12-21 LAB — CBG MONITORING, ED
Glucose-Capillary: 108 mg/dL — ABNORMAL HIGH (ref 70–99)
Glucose-Capillary: 205 mg/dL — ABNORMAL HIGH (ref 70–99)
Glucose-Capillary: 189 mg/dL — ABNORMAL HIGH (ref 70–99)
Glucose-Capillary: 221 mg/dL — ABNORMAL HIGH (ref 70–99)
Glucose-Capillary: 242 mg/dL — ABNORMAL HIGH (ref 70–99)

## 2023-12-21 NOTE — ED Notes (Signed)
242MG  OF CBG

## 2023-12-21 NOTE — Patient Outreach (Signed)
Pt Carl Strong is pending. TOC to follow.   Carl Strong.Carl Strong, MSW, LCSWA Carl Strong Carl Strong  Transitions of Care Clinical Social Worker I Direct Dial: [phone]  Fax: [phone] Carl Strong

## 2023-12-21 NOTE — ED Provider Notes (Signed)
Emergency Medicine Observation Re-evaluation Note  Carl Strong is a 89 y.o. male, seen on rounds today.  Pt initially presented to the ED for complaints of Fall Currently, the patient is awaiting possible placement.  Physical Exam  BP 136/65 (BP Location: Left Arm)   Pulse (!) 55   Temp 98.5 F (36.9 C) (Oral)   Resp 16   Ht 5\' 11"  (1.803 m)   Wt 74.8 kg   SpO2 100%   BMI 23.01 kg/m  Physical Exam Alert and in no acute distress ED Course / MDM  EKG:EKG Interpretation Date/Time:  Thursday December 19 2023 11:21:32 EST Ventricular Rate:  105 PR Interval:  117 QRS Duration:  86 QT Interval:  360 QTC Calculation: 476 R Axis:   84  Text Interpretation: Sinus tachycardia with irregular rate Borderline right axis deviation LVH with secondary repolarization abnormality ST depr, consider ischemia, inferior leads Borderline prolonged QT interval Artifact in lead(s) I II III aVR aVL aVF V4 V5 V6 Confirmed by Estelle June [phone]) on 12/19/2023 2:50:08 PM  I have reviewed the labs performed to date as well as medications administered while in observation.  Recent changes in the last 24 hours include none.  Plan  Current plan is for placement.    Bethann Berkshire, MD 12/21/23 [phone]

## 2023-12-22 LAB — CBG MONITORING, ED
Glucose-Capillary: 243 mg/dL — ABNORMAL HIGH (ref 70–99)
Glucose-Capillary: 337 mg/dL — ABNORMAL HIGH (ref 70–99)

## 2023-12-22 MED ORDER — OXYCODONE-ACETAMINOPHEN 5-325 MG PO TABS: 1.0000 | ORAL_TABLET | Freq: Four times a day (QID) | ORAL | 0 refills | Status: AC | PRN

## 2023-12-22 NOTE — Progress Notes (Addendum)
Addend @ 10:36 am  CSW has spoke to April from [facility]. April has confirmed receiving DC summary at this time patient will DC to facility. Bed 206 report # [phone]. At this time there are no further TOC needs.    CSW reached out to April at [facility] to inform of the patients. INS AUTH . At this time the patient can DC to facility. TOC to continue to follow DC.

## 2023-12-22 NOTE — ED Notes (Signed)
[facility] called for transport to [facility]. Also spoke to patient's daughter on telephone and updated her that patient had a bed and was waiting on transport.

## 2023-12-22 NOTE — ED Provider Notes (Signed)
Emergency Medicine Observation Re-evaluation Note  Carl Strong is a 89 y.o. male, seen on rounds today.  Pt initially presented to the ED for complaints of Fall Currently, the patient is eating breakfast. Pain has been decently controlled with the oxycodone.  Physical Exam  BP (!) 162/63   Pulse 60   Temp 98.2 F (36.8 C) (Oral)   Resp 17   Ht 5\' 11"  (1.803 m)   Wt 74.8 kg   SpO2 99%   BMI 23.01 kg/m  Physical Exam General: no distress, eating breakfast Lungs: normal effort  ED Course / MDM  EKG:EKG Interpretation Date/Time:  Thursday December 19 2023 11:21:32 EST Ventricular Rate:  105 PR Interval:  117 QRS Duration:  86 QT Interval:  360 QTC Calculation: 476 R Axis:   84  Text Interpretation: Sinus tachycardia with irregular rate Borderline right axis deviation LVH with secondary repolarization abnormality ST depr, consider ischemia, inferior leads Borderline prolonged QT interval Artifact in lead(s) I II III aVR aVL aVF V4 V5 V6 Confirmed by Estelle June [phone]) on 12/19/2023 2:50:08 PM  I have reviewed the labs performed to date as well as medications administered while in observation.  No recent changes in the last 24 hours.  Plan  Current plan is for discharge to [facility].    Pricilla Loveless, MD 12/22/23 1009

## 2023-12-22 NOTE — Discharge Instructions (Addendum)
We are scribing a strong pain medicine called oxycodone for pain.  Do not take this and drive or operate heavy machinery or combine with alcohol or other medicines.  Follow-up with your primary care physician for your fractures.  If you develop new or worsening pain, weakness or numbness in your extremities, bowel or bladder incontinence, or any other new/concerning symptoms then return to the ER or call 911.

## 2023-12-23 LAB — CBG MONITORING, ED: Glucose-Capillary: 322 mg/dL — ABNORMAL HIGH (ref 70–99)

## 2023-12-24 DIAGNOSIS — Z7901 Long term (current) use of anticoagulants: Secondary | ICD-10-CM | POA: Diagnosis not present

## 2023-12-24 DIAGNOSIS — S42022A Displaced fracture of shaft of left clavicle, initial encounter for closed fracture: Secondary | ICD-10-CM | POA: Diagnosis not present

## 2023-12-24 DIAGNOSIS — I480 Paroxysmal atrial fibrillation: Secondary | ICD-10-CM | POA: Diagnosis not present

## 2023-12-24 DIAGNOSIS — S32010A Wedge compression fracture of first lumbar vertebra, initial encounter for closed fracture: Secondary | ICD-10-CM | POA: Diagnosis not present

## 2023-12-27 DIAGNOSIS — R001 Bradycardia, unspecified: Secondary | ICD-10-CM | POA: Diagnosis not present

## 2024-03-12 IMAGING — DX DG CHEST 1V PORT
1 series · 1 of 1 positions shown · non-contrast
Comparison: 01/06/2017 and the chest CT of 03/28/2017.

CLINICAL DATA: Orthostasis.  Atrial fibrillation.

EXAM:
PORTABLE CHEST 1 VIEW

[chest]
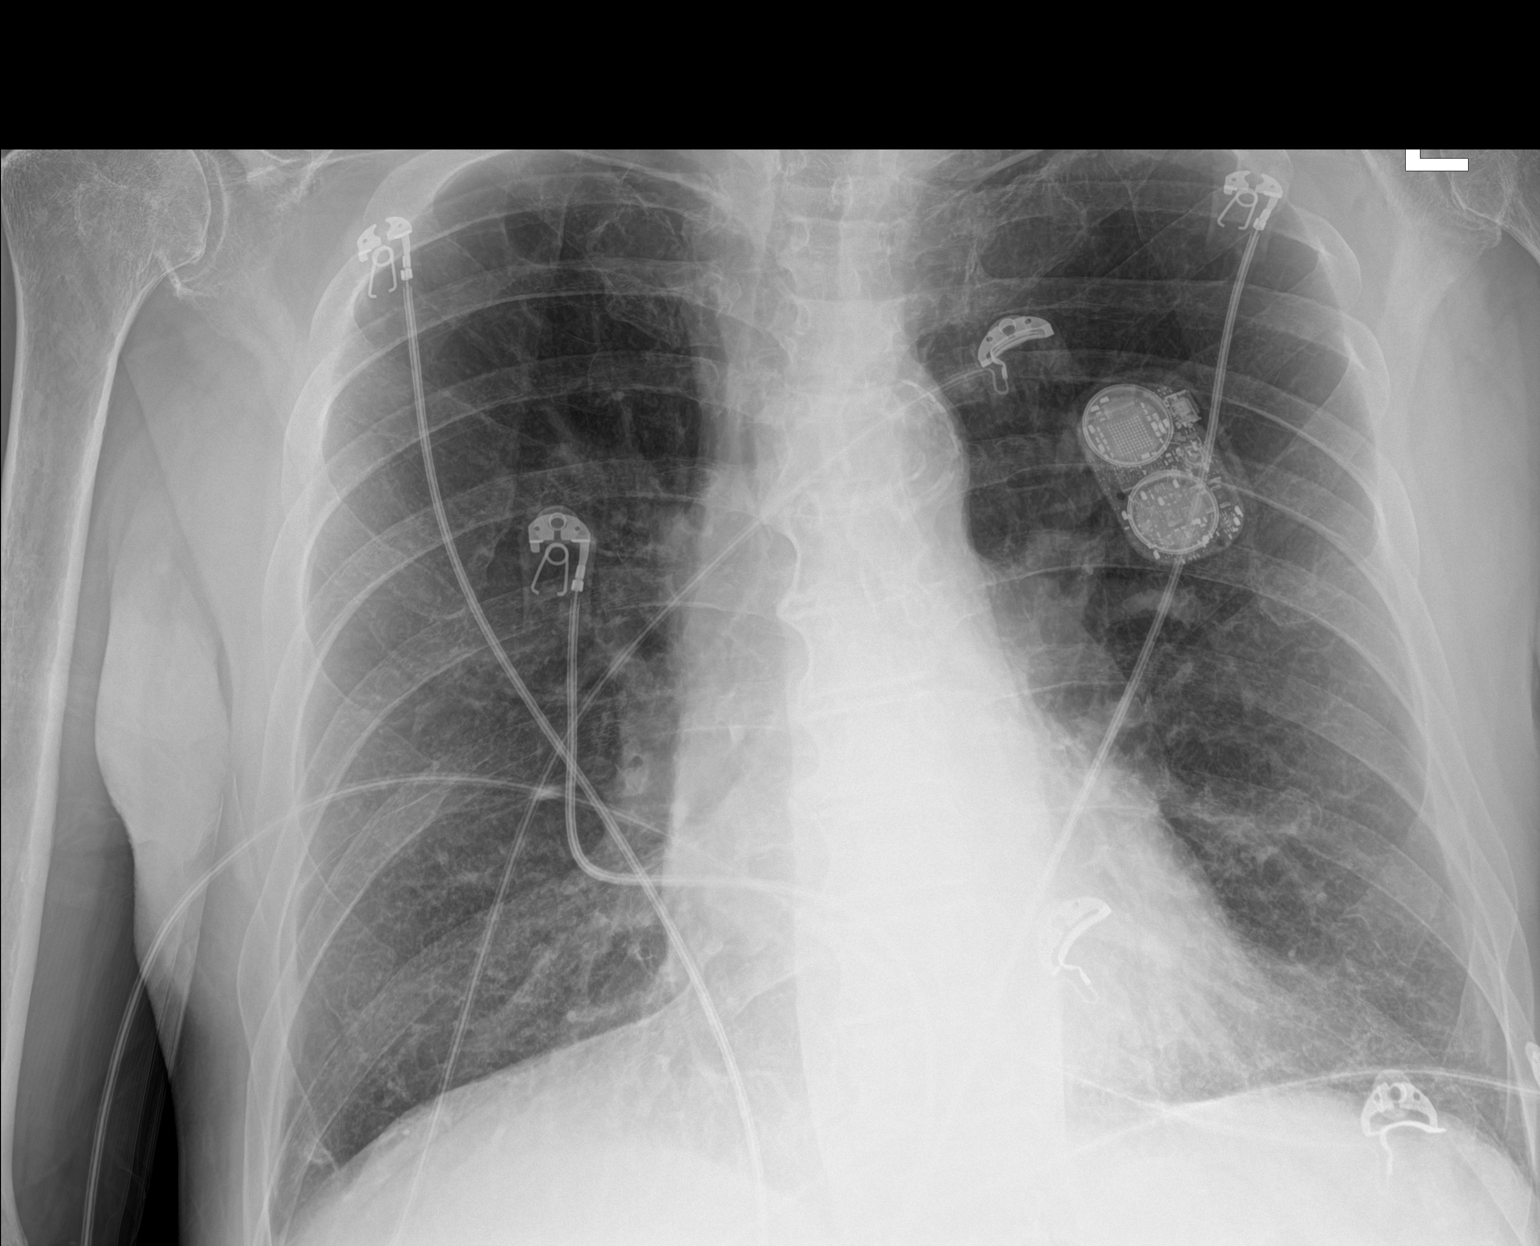

[1 of 1 positions shown; findings below may reference images not displayed]

FINDINGS: Numerous leads and wires project over the chest. A battery pack
projects over the left side of the chest. Midline trachea. Normal
heart size. Atherosclerosis in the transverse aorta. No pleural
effusion or pneumothorax. Lucency in the apices with lower lung
predominant interstitial thickening and coarsening, consistent with
the clinical history of smoking/emphysema. No superimposed lobar
consolidation. No congestive failure.
IMPRESSION: 1. No acute cardiopulmonary disease.
2. Aortic atherosclerosis.
